# Patient Record
Sex: Female | Born: 1935 | Race: White | Hispanic: No | Smoking: Never smoker
Health system: Southern US, Community
[De-identification: ages and names within clinical notes are randomized; demographics above are authoritative.]

## PROBLEM LIST (undated history)

## (undated) DIAGNOSIS — E785 Hyperlipidemia, unspecified: Secondary | ICD-10-CM

## (undated) DIAGNOSIS — F419 Anxiety disorder, unspecified: Secondary | ICD-10-CM

## (undated) DIAGNOSIS — Z85820 Personal history of malignant melanoma of skin: Secondary | ICD-10-CM

## (undated) DIAGNOSIS — K219 Gastro-esophageal reflux disease without esophagitis: Secondary | ICD-10-CM

## (undated) DIAGNOSIS — I10 Essential (primary) hypertension: Secondary | ICD-10-CM

## (undated) DIAGNOSIS — I2510 Atherosclerotic heart disease of native coronary artery without angina pectoris: Secondary | ICD-10-CM

## (undated) DIAGNOSIS — Z833 Family history of diabetes mellitus: Secondary | ICD-10-CM

## (undated) HISTORY — DX: Family history of diabetes mellitus: Z83.3

## (undated) HISTORY — DX: Gastro-esophageal reflux disease without esophagitis: K21.9

## (undated) HISTORY — DX: Essential (primary) hypertension: I10

## (undated) HISTORY — DX: Hyperlipidemia, unspecified: E78.5

## (undated) HISTORY — DX: Anxiety disorder, unspecified: F41.9

## (undated) HISTORY — DX: Personal history of malignant melanoma of skin: Z85.820

## (undated) HISTORY — PX: APPENDECTOMY: SHX54

---

## 2002-03-17 HISTORY — PX: CORONARY STENT PLACEMENT: SHX1402

## 2004-01-26 ENCOUNTER — Other Ambulatory Visit: Payer: Self-pay

## 2004-01-26 ENCOUNTER — Emergency Department: Payer: Self-pay | Admitting: Emergency Medicine

## 2004-03-17 HISTORY — PX: RENAL ARTERY STENT: SHX2321

## 2004-04-02 ENCOUNTER — Encounter: Payer: Self-pay | Admitting: Internal Medicine

## 2004-10-09 ENCOUNTER — Ambulatory Visit: Payer: Self-pay | Admitting: *Deleted

## 2004-10-21 ENCOUNTER — Ambulatory Visit: Payer: Self-pay | Admitting: Family Medicine

## 2004-12-09 ENCOUNTER — Ambulatory Visit: Payer: Self-pay | Admitting: *Deleted

## 2004-12-12 ENCOUNTER — Ambulatory Visit: Payer: Self-pay | Admitting: *Deleted

## 2005-04-07 ENCOUNTER — Inpatient Hospital Stay: Payer: Self-pay | Admitting: *Deleted

## 2005-11-26 ENCOUNTER — Ambulatory Visit: Payer: Self-pay | Admitting: Family Medicine

## 2006-05-09 ENCOUNTER — Emergency Department: Payer: Self-pay | Admitting: Unknown Physician Specialty

## 2006-05-09 ENCOUNTER — Other Ambulatory Visit: Payer: Self-pay

## 2006-12-01 ENCOUNTER — Ambulatory Visit: Payer: Self-pay | Admitting: Family Medicine

## 2007-12-03 ENCOUNTER — Ambulatory Visit: Payer: Self-pay | Admitting: Family Medicine

## 2007-12-03 LAB — CONVERTED CEMR LAB: Pap Smear: NORMAL

## 2008-01-26 ENCOUNTER — Encounter (INDEPENDENT_AMBULATORY_CARE_PROVIDER_SITE_OTHER): Payer: Self-pay | Admitting: Family Medicine

## 2008-02-04 ENCOUNTER — Ambulatory Visit: Payer: Self-pay | Admitting: Family Medicine

## 2008-02-04 DIAGNOSIS — Z85820 Personal history of malignant melanoma of skin: Secondary | ICD-10-CM

## 2008-02-04 DIAGNOSIS — I2510 Atherosclerotic heart disease of native coronary artery without angina pectoris: Secondary | ICD-10-CM | POA: Insufficient documentation

## 2008-02-04 DIAGNOSIS — R0989 Other specified symptoms and signs involving the circulatory and respiratory systems: Secondary | ICD-10-CM | POA: Insufficient documentation

## 2008-02-04 DIAGNOSIS — F411 Generalized anxiety disorder: Secondary | ICD-10-CM | POA: Insufficient documentation

## 2008-02-04 DIAGNOSIS — I10 Essential (primary) hypertension: Secondary | ICD-10-CM | POA: Insufficient documentation

## 2008-02-04 DIAGNOSIS — E785 Hyperlipidemia, unspecified: Secondary | ICD-10-CM

## 2008-02-04 DIAGNOSIS — E119 Type 2 diabetes mellitus without complications: Secondary | ICD-10-CM | POA: Insufficient documentation

## 2008-02-04 DIAGNOSIS — N183 Chronic kidney disease, stage 3 unspecified: Secondary | ICD-10-CM | POA: Insufficient documentation

## 2008-02-04 DIAGNOSIS — K219 Gastro-esophageal reflux disease without esophagitis: Secondary | ICD-10-CM | POA: Insufficient documentation

## 2008-02-04 DIAGNOSIS — I252 Old myocardial infarction: Secondary | ICD-10-CM | POA: Insufficient documentation

## 2008-02-04 LAB — CONVERTED CEMR LAB
Blood in Urine, dipstick: NEGATIVE
Cholesterol, target level: 200 mg/dL
Glucose, Bld: 88 mg/dL
Glucose, Urine, Semiquant: NEGATIVE
HDL goal, serum: 40 mg/dL
Hgb A1c MFr Bld: 6.7 %
Ketones, urine, test strip: NEGATIVE
LDL Goal: 70 mg/dL
Protein, U semiquant: NEGATIVE
pH: 5.5

## 2008-02-07 ENCOUNTER — Telehealth (INDEPENDENT_AMBULATORY_CARE_PROVIDER_SITE_OTHER): Payer: Self-pay | Admitting: Family Medicine

## 2008-02-08 ENCOUNTER — Telehealth (INDEPENDENT_AMBULATORY_CARE_PROVIDER_SITE_OTHER): Payer: Self-pay | Admitting: *Deleted

## 2008-02-14 ENCOUNTER — Ambulatory Visit (HOSPITAL_COMMUNITY): Admission: RE | Admit: 2008-02-14 | Discharge: 2008-02-14 | Payer: Self-pay | Admitting: Family Medicine

## 2008-02-15 ENCOUNTER — Telehealth (INDEPENDENT_AMBULATORY_CARE_PROVIDER_SITE_OTHER): Payer: Self-pay | Admitting: *Deleted

## 2008-02-18 ENCOUNTER — Ambulatory Visit: Payer: Self-pay | Admitting: Family Medicine

## 2008-02-18 DIAGNOSIS — N183 Chronic kidney disease, stage 3 (moderate): Secondary | ICD-10-CM

## 2008-02-29 ENCOUNTER — Ambulatory Visit: Payer: Self-pay | Admitting: Cardiology

## 2008-03-01 ENCOUNTER — Telehealth (INDEPENDENT_AMBULATORY_CARE_PROVIDER_SITE_OTHER): Payer: Self-pay | Admitting: *Deleted

## 2008-03-02 ENCOUNTER — Encounter (INDEPENDENT_AMBULATORY_CARE_PROVIDER_SITE_OTHER): Payer: Self-pay | Admitting: Family Medicine

## 2008-03-17 HISTORY — PX: NM MYOVIEW LTD: HXRAD82

## 2008-03-20 ENCOUNTER — Encounter (INDEPENDENT_AMBULATORY_CARE_PROVIDER_SITE_OTHER): Payer: Self-pay | Admitting: Family Medicine

## 2008-03-21 LAB — CONVERTED CEMR LAB
ALT: 15 units/L (ref 0–35)
Albumin: 4.3 g/dL (ref 3.5–5.2)
Alkaline Phosphatase: 71 units/L (ref 39–117)
CO2: 26 meq/L (ref 19–32)
Chloride: 105 meq/L (ref 96–112)
Creatinine, Urine: 112.8 mg/dL
Glucose, Bld: 118 mg/dL — ABNORMAL HIGH (ref 70–99)
Microalb Creat Ratio: 5.5 mg/g (ref 0.0–30.0)
Potassium: 4.6 meq/L (ref 3.5–5.3)
Total CHOL/HDL Ratio: 3.6

## 2008-03-27 ENCOUNTER — Ambulatory Visit: Payer: Self-pay | Admitting: Cardiology

## 2008-03-27 ENCOUNTER — Encounter (HOSPITAL_COMMUNITY): Admission: RE | Admit: 2008-03-27 | Discharge: 2008-04-26 | Payer: Self-pay | Admitting: Cardiology

## 2008-03-27 ENCOUNTER — Encounter (INDEPENDENT_AMBULATORY_CARE_PROVIDER_SITE_OTHER): Payer: Self-pay | Admitting: Family Medicine

## 2008-03-29 ENCOUNTER — Telehealth (INDEPENDENT_AMBULATORY_CARE_PROVIDER_SITE_OTHER): Payer: Self-pay | Admitting: *Deleted

## 2008-03-30 ENCOUNTER — Telehealth (INDEPENDENT_AMBULATORY_CARE_PROVIDER_SITE_OTHER): Payer: Self-pay | Admitting: *Deleted

## 2008-04-03 ENCOUNTER — Ambulatory Visit: Payer: Self-pay | Admitting: Cardiology

## 2008-04-27 ENCOUNTER — Telehealth (INDEPENDENT_AMBULATORY_CARE_PROVIDER_SITE_OTHER): Payer: Self-pay | Admitting: *Deleted

## 2008-05-22 ENCOUNTER — Ambulatory Visit: Payer: Self-pay | Admitting: Family Medicine

## 2008-05-22 DIAGNOSIS — J309 Allergic rhinitis, unspecified: Secondary | ICD-10-CM | POA: Insufficient documentation

## 2008-05-22 LAB — CONVERTED CEMR LAB: Hgb A1c MFr Bld: 7 %

## 2008-07-27 ENCOUNTER — Ambulatory Visit: Payer: Self-pay | Admitting: Family Medicine

## 2008-07-27 ENCOUNTER — Telehealth (INDEPENDENT_AMBULATORY_CARE_PROVIDER_SITE_OTHER): Payer: Self-pay | Admitting: *Deleted

## 2008-07-27 DIAGNOSIS — N3000 Acute cystitis without hematuria: Secondary | ICD-10-CM | POA: Insufficient documentation

## 2008-07-27 LAB — CONVERTED CEMR LAB
Bilirubin Urine: NEGATIVE
Glucose, Urine, Semiquant: NEGATIVE
Nitrite: NEGATIVE
Protein, U semiquant: NEGATIVE
Specific Gravity, Urine: 1.015
pH: 5

## 2008-07-31 ENCOUNTER — Telehealth (INDEPENDENT_AMBULATORY_CARE_PROVIDER_SITE_OTHER): Payer: Self-pay | Admitting: Family Medicine

## 2008-08-03 ENCOUNTER — Telehealth (INDEPENDENT_AMBULATORY_CARE_PROVIDER_SITE_OTHER): Payer: Self-pay | Admitting: *Deleted

## 2008-08-24 ENCOUNTER — Ambulatory Visit: Payer: Self-pay | Admitting: Family Medicine

## 2008-08-25 LAB — CONVERTED CEMR LAB
Albumin: 4.1 g/dL (ref 3.5–5.2)
Alkaline Phosphatase: 65 units/L (ref 39–117)
CO2: 22 meq/L (ref 19–32)
Chloride: 104 meq/L (ref 96–112)
LDL Cholesterol: 121 mg/dL — ABNORMAL HIGH (ref 0–99)
Potassium: 4.6 meq/L (ref 3.5–5.3)
Sodium: 138 meq/L (ref 135–145)
Total Bilirubin: 0.5 mg/dL (ref 0.3–1.2)
Total CHOL/HDL Ratio: 4.4
Total Protein: 7 g/dL (ref 6.0–8.3)

## 2008-09-16 ENCOUNTER — Emergency Department (HOSPITAL_COMMUNITY): Admission: EM | Admit: 2008-09-16 | Discharge: 2008-09-16 | Payer: Self-pay | Admitting: Emergency Medicine

## 2008-09-20 ENCOUNTER — Encounter (INDEPENDENT_AMBULATORY_CARE_PROVIDER_SITE_OTHER): Payer: Self-pay | Admitting: Family Medicine

## 2008-12-28 ENCOUNTER — Ambulatory Visit: Payer: Self-pay

## 2009-01-08 ENCOUNTER — Ambulatory Visit: Payer: Self-pay

## 2009-03-17 ENCOUNTER — Emergency Department (HOSPITAL_COMMUNITY): Admission: EM | Admit: 2009-03-17 | Discharge: 2009-03-17 | Payer: Self-pay | Admitting: Emergency Medicine

## 2009-07-25 ENCOUNTER — Encounter (INDEPENDENT_AMBULATORY_CARE_PROVIDER_SITE_OTHER): Payer: Self-pay | Admitting: *Deleted

## 2009-09-23 IMAGING — NM NM MYOCAR MULTI W/SPECT W/WALL MOTION & EF
2 series · 12 of 12 positions shown · non-contrast
Comparison: none

Ordering Physician: Noramay Relles

Felicia Mari Physician: [REDACTED]al Data: 88-year-old woman with chest and epigastric
discomfort during exertion.
NUCLEAR MEDICINE STRESS MYOVIEW STUDY WITH SPECT AND LEFT
VENTRICULAR EJECTION FRACTION
Radionuclide Data: One-day rest/stress protocol performed with
[DATE] mCi of Sc-55m Myoview.
Stress Data: Graded exercise performed to a workload of 6.8 mets
and a heart rate of 166, 123% of age - predicted maximum.  Exercise
discontinued due to mild fatigue; no chest pain reported.  Blood
pressure increased from a resting value of 150/80 to 180/80 early
in recovery, a normal response.  No arrhythmias noted.
EKG: Normal sinus rhythm; somewhat delayed R-wave progression;
minor nonspecific T-wave abnormality; mild QT prolongation.
Stress EKG:  One - 2 mm of slowly upsloping ST-segment depression
noted in the inferior leads as well as lead I.  Reverted towards
normal rapidly in recovery.
Scintigraphic Data: Acquisition notable for mild breast attenuation
and minimal diaphragmatic attenuation.  Left ventricular size was
normal.  On tomographic images reconstructed in standard planes,
there was uniform and normal uptake of tracer in all myocardial
segments.  The rest images were unchanged.  The gated
reconstruction demonstrated normal to hyperdynamic regional and
global LV systolic function as well as normal systolic accentuation
of activity throughout.  Estimated ejection fraction exceeded 0.65.

[Series 1: cr cardiac tc low dose · 6.52mm/px · 6 of 64 frames shown]
[frame 6/64]
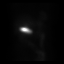
[frame 16/64]
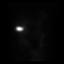
[frame 27/64]
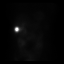
[frame 38/64]
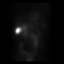
[frame 48/64]
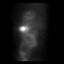
[frame 59/64]
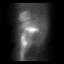

[Series 1: cs cardiac tc hi dose · 6.52mm/px · 6 of 512 frames shown]
[frame 43/512]
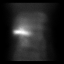
[frame 128/512]
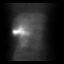
[frame 214/512]
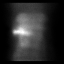
[frame 299/512]
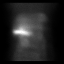
[frame 384/512]
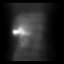
[frame 470/512]
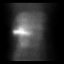

[12 of 12 positions shown; findings below may reference images not displayed]

IMPRESSION: Negative stress nuclear myocardial study revealing adequate
exercise tolerance for age, a borderline positive stress EKG,
normal left ventricular size and normal left ventricular systolic
function.  An entirely normal perfusion study suggests that the
electrocardiographic response is a false positive.  There is no
scintigraphic evidence for myocardial ischemia or infarction.
Other findings as noted.

## 2010-01-15 ENCOUNTER — Ambulatory Visit: Payer: Self-pay | Admitting: Internal Medicine

## 2010-04-16 NOTE — Letter (Signed)
Summary: Appointment - Reminder 2  [facility] [place]. [address], [place] [address]   Phone: [phone]  Fax: [phone]     Jul 25, 2009 MRN: [id number]   Regina Good [address] [place], [place]  [address]   Dear Regina Good,  Our records indicate that it is time to schedule a follow-up appointment.  Dr. Dietrich Pates         recommended that you follow up with [place] in     JANUARY 2011      . It is very important that we reach you to schedule this appointment. We look forward to participating in your health care needs. Please contact us at the number listed above at your earliest convenience to schedule your appointment.  If you are unable to make an appointment at this time, give [place] a call so we can update our records.     Sincerely,   [employer] Scheduling Team [phone]

## 2010-06-02 LAB — URINALYSIS, ROUTINE W REFLEX MICROSCOPIC
Glucose, UA: NEGATIVE mg/dL
Ketones, ur: NEGATIVE mg/dL
Protein, ur: NEGATIVE mg/dL
Specific Gravity, Urine: 1.015 (ref 1.005–1.030)
pH: 6 (ref 5.0–8.0)

## 2010-06-02 LAB — URINE MICROSCOPIC-ADD ON

## 2010-06-23 LAB — URINALYSIS, ROUTINE W REFLEX MICROSCOPIC: Glucose, UA: NEGATIVE mg/dL

## 2010-06-23 LAB — URINE CULTURE: Colony Count: 15000

## 2010-06-23 LAB — URINE MICROSCOPIC-ADD ON

## 2010-07-30 NOTE — Letter (Signed)
February 29, 2008    Franchot Heidelberg, M.D.  [address]  [place], [place], [address]   RE:  CHERINE, DRUMGOOLE  MRN:  [id number]  /  DOB:  November 24, 1919   Dear Remi Haggard,   It is my pleasure evaluating Ms. Tumolo in the office today in  consultation at your request for coronary artery disease.  As you know,  this nice woman suffered an acute myocardial infarction in late 2005.  She was referred to [facility] where percutaneous  intervention with a stent was undertaken.  She subsequently has done  extremely well from a cardiac standpoint, requiring no further  attention.  She was seen briefly by one of the cardiologists in  [place], but not for years.  She does have multiple risk factors  including hypertension, hyperlipidemia, and diabetes, all of which have  been in good control medically.  She was evaluated by a vascular  physician in 2006 and found to have bilateral renal artery stenosis for  which stents were placed.   She describes allergies to SULFA drugs and CODEINE.   CURRENT MEDICATIONS:  1. Glipizide 10 mg b.i.d.  2. Atorvastatin 40 mg daily.  3. Diazepam 2 mg nightly.  4. Aspirin 81 mg daily.  5. Lisinopril/HCT 1 daily.   SOCIAL HISTORY:  Retired; married and lives in [place].  She  is changing physicians to allow her to drive less.  She has 3 adult  children.   FAMILY HISTORY:  Father died due to myocardial infarction.  Mother had  gastric carcinoma at age 26.  Of 11 siblings, 2 have had CVAs.   REVIEW OF SYSTEMS:  Notable for the need for corrective lenses,  bilateral hearing loss with hearing aids, upper and lower dentures, a  remote history of jaundice, urinary frequency, and arthritic discomfort  in her fingers.  All other systems are negative.   PHYSICAL EXAMINATION:  GENERAL:  Pleasant, well-appearing, trim woman in  no acute distress.  VITAL SIGNS:  The height is 5 feet 5 inches, weight 140, blood pressure  130/80,  heart rate 79 and regular, and respirations 14.  HEENT:  Small pupils that are equal, round, and react to light; EOMs  full; normal oral mucosa.  NECK:  No jugular venous distention; normal carotid upstrokes without  bruits.  ENDOCRINE:  No thyromegaly.  HEMATOPOIETIC:  No adenopathy.  SKIN:  No significant lesions.  LUNGS:  Clear.  CARDIAC:  Normal first and second heart sounds; modest systolic murmur  at the left sternal border.  ABDOMEN:  Soft and nontender; no organomegaly.  EXTREMITIES:  No edema; distal pulses intact.  NEUROLOGIC:  Symmetric strength and tone; normal cranial nerves.   EKG:  Normal sinus rhythm; slightly delayed R-wave progression;  borderline left atrial abnormality; otherwise unremarkable.   No recent laboratory studies are available.   IMPRESSION:  Ms. Spielberg has coronary artery disease and multiple  cardiovascular risk factors.  She describes episodes of lower  chest/epigastric tightness, when working in her garden or performing  other moderate activity.  Symptoms resolved with 10-15 minutes of rest.  She has nitroglycerin, but has not used them.  A stress test to evaluate  for myocardial ischemia is clearly warranted.  She would like to defer  this until January, which is a reasonable plan.   We will secure recent records from her previous primary care [facility].  We will verify adequate control of hyperlipidemia.  Blood pressure  control looks fine  at present, but in the setting of coronary artery  disease with prior myocardial infarction, treatment with a beta-blocker  is warranted.  We will use Toprol at a dose of 25 mg daily.  I will  reassess this nice woman after her stress test has been completed.    Sincerely,      Gerrit Friends. Dietrich Pates, MD, [facility]  Electronically Signed    RMR/MedQ  DD: 02/29/2008  DT: 03/01/2008  Job #: [id number]

## 2010-07-30 NOTE — Letter (Signed)
April 03, 2008    Franchot Heidelberg, MD  [address]  [place], [place]  [address]   RE:  Regina, Good  MRN:  [id number]  /  DOB:  [birth year]   Dear Remi Haggard,   Regina Good returns to the office for continued assessment and treatment  of coronary artery disease.  Since her last visit, she has had no  dyspnea nor chest discomfort.  She remains active and generally feels  fine.  She did undergo a stress nuclear study that showed fairly good  exercise tolerance with a borderline abnormal EKG response, but a  completely normal perfusion study.   Medications are unchanged from her last visit except for the addition of  Toprol 25 mg daily.   PHYSICAL EXAMINATION:  GENERAL:  Pleasant, trim woman in no acute  distress.  VITAL SIGNS:  The weight is 143, 3 pounds more than at her last visit,  blood pressure 140/70, heart rate 65 and regular, respirations 14.  NECK:  No jugular venous distention.  No carotid bruits.  LUNGS:  Clear.  CARDIAC:  Normal first and second heart sounds.  Minimal systolic  murmur.  ABDOMEN:  Soft and nontender.  No bruits.  EXTREMITIES:  No edema.  Distal pulses are somewhat reduced,  particularly on the left, but intact.  The patient has no history of  claudication.   Recent laboratory reveals good control of lipids with total cholesterol  of 175, triglycerides of 170, HDL of 49, and LDL of 92.  A chemistry  profile is essentially normal except for a fasting glucose of 118, BUN  of 25, and creatinine of 1.36.  Microalbumin in the urine is normal.   IMPRESSION:  Regina Good is doing very well with near optimal control of  cardiovascular risk factors and an excellent perfusion study.  It would  be worthwhile to collect additional blood pressure measurements and try  to decrease systolics to 130, perhaps by increasing her dose of  lisinopril and Toprol.  I will leave that to your discretion and plan to  see this nice woman again in 1 year.    Sincerely,      Gerrit Friends. Dietrich Pates, MD, [facility]  Electronically Signed    RMR/MedQ  DD: 04/03/2008  DT: 04/04/2008  Job #: [id number]

## 2011-03-13 ENCOUNTER — Encounter: Payer: Self-pay | Admitting: Cardiology

## 2013-11-03 ENCOUNTER — Other Ambulatory Visit (HOSPITAL_COMMUNITY): Payer: Self-pay | Admitting: Internal Medicine

## 2013-11-03 ENCOUNTER — Ambulatory Visit (HOSPITAL_COMMUNITY)
Admission: RE | Admit: 2013-11-03 | Discharge: 2013-11-03 | Disposition: A | Discharge status: Home / Self Care | Payer: Medicare PPO | Source: Ambulatory Visit | Attending: Internal Medicine | Admitting: Internal Medicine

## 2013-11-03 DIAGNOSIS — M545 Low back pain, unspecified: Secondary | ICD-10-CM | POA: Diagnosis present

## 2013-11-03 DIAGNOSIS — T148XXA Other injury of unspecified body region, initial encounter: Secondary | ICD-10-CM

## 2013-11-03 DIAGNOSIS — I700 Atherosclerosis of aorta: Secondary | ICD-10-CM | POA: Diagnosis not present

## 2013-11-03 DIAGNOSIS — M47817 Spondylosis without myelopathy or radiculopathy, lumbosacral region: Secondary | ICD-10-CM | POA: Insufficient documentation

## 2014-01-19 ENCOUNTER — Emergency Department (HOSPITAL_COMMUNITY)
Admission: EM | Admit: 2014-01-19 | Discharge: 2014-01-19 | Disposition: A | Discharge status: Home / Self Care | Payer: Commercial Managed Care - HMO | Attending: Emergency Medicine | Admitting: Emergency Medicine

## 2014-01-19 ENCOUNTER — Emergency Department (HOSPITAL_COMMUNITY): Payer: Commercial Managed Care - HMO

## 2014-01-19 ENCOUNTER — Encounter (HOSPITAL_COMMUNITY): Payer: Self-pay | Admitting: Emergency Medicine

## 2014-01-19 DIAGNOSIS — F4321 Adjustment disorder with depressed mood: Secondary | ICD-10-CM | POA: Diagnosis not present

## 2014-01-19 DIAGNOSIS — K219 Gastro-esophageal reflux disease without esophagitis: Secondary | ICD-10-CM | POA: Diagnosis not present

## 2014-01-19 DIAGNOSIS — Z79899 Other long term (current) drug therapy: Secondary | ICD-10-CM | POA: Diagnosis not present

## 2014-01-19 DIAGNOSIS — Z85820 Personal history of malignant melanoma of skin: Secondary | ICD-10-CM | POA: Diagnosis not present

## 2014-01-19 DIAGNOSIS — K5900 Constipation, unspecified: Secondary | ICD-10-CM | POA: Diagnosis not present

## 2014-01-19 DIAGNOSIS — Z791 Long term (current) use of non-steroidal anti-inflammatories (NSAID): Secondary | ICD-10-CM | POA: Diagnosis not present

## 2014-01-19 DIAGNOSIS — F419 Anxiety disorder, unspecified: Secondary | ICD-10-CM | POA: Insufficient documentation

## 2014-01-19 DIAGNOSIS — Z7982 Long term (current) use of aspirin: Secondary | ICD-10-CM | POA: Diagnosis not present

## 2014-01-19 DIAGNOSIS — I10 Essential (primary) hypertension: Secondary | ICD-10-CM | POA: Diagnosis not present

## 2014-01-19 DIAGNOSIS — Z9861 Coronary angioplasty status: Secondary | ICD-10-CM | POA: Insufficient documentation

## 2014-01-19 DIAGNOSIS — I119 Hypertensive heart disease without heart failure: Secondary | ICD-10-CM | POA: Diagnosis not present

## 2014-01-19 LAB — CBC WITH DIFFERENTIAL/PLATELET
BASOS ABS: 0 10*3/uL (ref 0.0–0.1)
BASOS PCT: 0 % (ref 0–1)
EOS PCT: 2 % (ref 0–5)
Eosinophils Absolute: 0.2 10*3/uL (ref 0.0–0.7)
HEMATOCRIT: 35.6 % — AB (ref 36.0–46.0)
HEMOGLOBIN: 11.2 g/dL — AB (ref 12.0–15.0)
LYMPHS PCT: 17 % (ref 12–46)
Lymphs Abs: 1.4 10*3/uL (ref 0.7–4.0)
MCH: 26.9 pg (ref 26.0–34.0)
MCHC: 31.5 g/dL (ref 30.0–36.0)
MCV: 85.4 fL (ref 78.0–100.0)
MONO ABS: 0.4 10*3/uL (ref 0.1–1.0)
MONOS PCT: 5 % (ref 3–12)
Neutro Abs: 6.4 10*3/uL (ref 1.7–7.7)
Neutrophils Relative %: 76 % (ref 43–77)
PLATELETS: 332 10*3/uL (ref 150–400)
RBC: 4.17 MIL/uL (ref 3.87–5.11)
RDW: 14.2 % (ref 11.5–15.5)
WBC: 8.5 10*3/uL (ref 4.0–10.5)

## 2014-01-19 LAB — LIPASE, BLOOD: LIPASE: 63 U/L — AB (ref 11–59)

## 2014-01-19 LAB — COMPREHENSIVE METABOLIC PANEL
ALBUMIN: 3.7 g/dL (ref 3.5–5.2)
ALK PHOS: 100 U/L (ref 39–117)
ALT: 15 U/L (ref 0–35)
AST: 19 U/L (ref 0–37)
Anion gap: 12 (ref 5–15)
BILIRUBIN TOTAL: 0.4 mg/dL (ref 0.3–1.2)
BUN: 22 mg/dL (ref 6–23)
CHLORIDE: 103 meq/L (ref 96–112)
CO2: 25 meq/L (ref 19–32)
CREATININE: 1.47 mg/dL — AB (ref 0.50–1.10)
Calcium: 9 mg/dL (ref 8.4–10.5)
GFR calc non Af Amer: 30 mL/min — ABNORMAL LOW (ref >90)
GFR, EST AFRICAN AMERICAN: 35 mL/min — AB (ref >90)
Glucose, Bld: 154 mg/dL — ABNORMAL HIGH (ref 70–99)
POTASSIUM: 4.3 meq/L (ref 3.7–5.3)
Sodium: 140 mEq/L (ref 137–147)
Total Protein: 7.3 g/dL (ref 6.0–8.3)

## 2014-01-19 LAB — URINALYSIS, ROUTINE W REFLEX MICROSCOPIC
Bilirubin Urine: NEGATIVE
GLUCOSE, UA: NEGATIVE mg/dL
HGB URINE DIPSTICK: NEGATIVE
Ketones, ur: NEGATIVE mg/dL
Leukocytes, UA: NEGATIVE
Nitrite: NEGATIVE
PH: 5.5 (ref 5.0–8.0)
Protein, ur: NEGATIVE mg/dL
SPECIFIC GRAVITY, URINE: 1.02 (ref 1.005–1.030)
Urobilinogen, UA: 0.2 mg/dL (ref 0.0–1.0)

## 2014-01-19 MED ORDER — ALPRAZOLAM 0.5 MG PO TABS
0.5000 mg | ORAL_TABLET | Freq: Once | ORAL | Status: AC
  Administered 2014-01-19: 0.5 mg via ORAL
  Filled 2014-01-19: qty 1

## 2014-01-19 NOTE — Discharge Instructions (Signed)
Please use colace, senokot, and drink plenty of fluids for constipation.  Take ativan as prescribed by Dr. Nevada Crane for anxiety and sleep.  Recheck with Dr. Nevada Crane next week or return here if worse.   Constipation Constipation is when a person has fewer than three bowel movements a week, has difficulty having a bowel movement, or has stools that are dry, hard, or larger than normal. As people grow older, constipation is more common. If you try to fix constipation with medicines that make you have a bowel movement (laxatives), the problem may get worse. Long-term laxative use may cause the muscles of the colon to become weak. A low-fiber diet, not taking in enough fluids, and taking certain medicines may make constipation worse.  CAUSES   Certain medicines, such as antidepressants, pain medicine, iron supplements, antacids, and water pills.   Certain diseases, such as diabetes, irritable bowel syndrome (IBS), thyroid disease, or depression.   Not drinking enough water.   Not eating enough fiber-rich foods.   Stress or travel.   Lack of physical activity or exercise.   Ignoring the urge to have a bowel movement.   Using laxatives too much.  SIGNS AND SYMPTOMS   Having fewer than three bowel movements a week.   Straining to have a bowel movement.   Having stools that are hard, dry, or larger than normal.   Feeling full or bloated.   Pain in the lower abdomen.   Not feeling relief after having a bowel movement.  DIAGNOSIS  Your health care provider will take a medical history and perform a physical exam. Further testing may be done for severe constipation. Some tests may include:  A barium enema X-Chasitie Passey to examine your rectum, colon, and, sometimes, your small intestine.   A sigmoidoscopy to examine your lower colon.   A colonoscopy to examine your entire colon. TREATMENT  Treatment will depend on the severity of your constipation and what is causing it. Some dietary  treatments include drinking more fluids and eating more fiber-rich foods. Lifestyle treatments may include regular exercise. If these diet and lifestyle recommendations do not help, your health care provider may recommend taking over-the-counter laxative medicines to help you have bowel movements. Prescription medicines may be prescribed if over-the-counter medicines do not work.  HOME CARE INSTRUCTIONS   Eat foods that have a lot of fiber, such as fruits, vegetables, whole grains, and beans.  Limit foods high in fat and processed sugars, such as french fries, hamburgers, cookies, candies, and soda.   A fiber supplement may be added to your diet if you cannot get enough fiber from foods.   Drink enough fluids to keep your urine clear or pale yellow.   Exercise regularly or as directed by your health care provider.   Go to the restroom when you have the urge to go. Do not hold it.   Only take over-the-counter or prescription medicines as directed by your health care provider. Do not take other medicines for constipation without talking to your health care provider first.  Regina Good IF:   You have bright red blood in your stool.   Your constipation lasts for more than 4 days or gets worse.   You have abdominal or rectal pain.   You have thin, pencil-like stools.   You have unexplained weight loss. MAKE SURE YOU:   Understand these instructions.  Will watch your condition.  Will get help right away if you are not doing well or get worse.  Document Released: 11/30/2003 Document Revised: 03/08/2013 Document Reviewed: 12/13/2012 [facility] Patient Information 2015 [place], [place]. This information is not intended to replace advice given to you by your health care provider. Make sure you discuss any questions you have with your health care provider.

## 2014-01-19 NOTE — ED Notes (Addendum)
Patient c/o anxiety since October 20th, when her husband passed away.Patient states "I couldn't sleep last night I was shaking the bed so hard." Patient also c/o constipation and nausea with lower back pain, last normal BM 4 days ago. Patient reports taking stool softeners and laxatives with no relief.

## 2014-01-19 NOTE — ED Notes (Signed)
MD at bedside. 

## 2014-01-19 NOTE — ED Notes (Signed)
PT c/o increased anxiety and decrease of sleep, but pt states she has been out of her xanax medication for 2 weeks. PT to follow up with her orthopedic MD at the office tomorrow for her lower back pain per pt.

## 2014-01-19 NOTE — ED Provider Notes (Signed)
CSN: [id number]     Arrival date & time 01/19/14  4481 History   First MD Initiated Contact with Patient 01/19/14 0840     Chief Complaint  Patient presents with  . Anxiety  . Constipation     (Consider location/radiation/quality/duration/timing/severity/associated sxs/prior Treatment) HPI 80 year old female who comes in today complaining of anxiety and constipation. She states that she has had constipation for a month. She states this began when she began having back pain. She went to see her primary care doctor and was started on medication. She states the constipation began at the same time as when she started taking the medication. She says that she has since discontinued this medicine was unable to tell me what it was. She has continued to have constipation. She feels that she is more anxious than usual. However, she did lose her husband of 28 years 2 weeks ago. She had a prescription for Xanax 0.25 mg every at bedtime which she has an empty bottle of. She states she has not been taking that for a couple of days as she has been out of it. She denies injury to her back except pain when she stood up from squatting down a month ago. She has not had lateralized pain, numbness, tingling, lateralized weakness, or prior loss of bowel or bladder control. She has been eating and drinking somewhat less than usual but has been taking things in by mouth. She states that she is not living alone and her daughter has been with her. Past Medical History  Diagnosis Date  . Diabetes mellitus   . Hypertension   . Hyperlipidemia   . History of melanoma   . Family history of diabetes mellitus   . GERD (gastroesophageal reflux disease)   . Anxiety    Past Surgical History  Procedure Laterality Date  . Appendectomy    . Renal artery stent  2006  . Coronary stent placement  2004  . Nm myoview ltd  2010   Family History  Problem Relation Age of Onset  . Pancreatic cancer Mother   . Coronary artery  disease Father   . Diabetes Brother   . Colon cancer Sister   . Stroke Sister   . Stroke Sister   . Diabetes Son   . Stroke Son   . Schizophrenia Daughter    History  Substance Use Topics  . Smoking status: Never Smoker   . Smokeless tobacco: Never Used  . Alcohol Use: No   OB History    Gravida Para Term Preterm AB TAB SAB Ectopic Multiple Living   3 3 3       3      Review of Systems  All other systems reviewed and are negative.     Allergies  Codeine and Sulfonamide derivatives  Home Medications   Prior to Admission medications   Medication Sig Start Date End Date Taking? Authorizing Provider  acetaminophen (TYLENOL) 500 MG tablet Take 500 mg by mouth every 6 (six) hours as needed for moderate pain.   Yes Historical Provider, MD  ALPRAZolam (XANAX) 0.25 MG tablet Take 0.125 mg by mouth at bedtime as needed for anxiety.    Yes Historical Provider, MD  amLODipine (NORVASC) 5 MG tablet Take 5 mg by mouth daily.   Yes Historical Provider, MD  aspirin 81 MG tablet Take 81 mg by mouth daily.     Yes Historical Provider, MD  atorvastatin (LIPITOR) 80 MG tablet Take 80 mg by mouth daily.  Yes Historical Provider, MD  diclofenac (VOLTAREN) 75 MG EC tablet Take 1 tablet by mouth daily.  11/03/13  Yes Historical Provider, MD  glipiZIDE (GLUCOTROL) 10 MG tablet Take 5-10 mg by mouth 2 (two) times daily.    Yes Historical Provider, MD  LORazepam (ATIVAN) 0.5 MG tablet Take 1 tablet by mouth 3 times/day as needed-between meals & bedtime for anxiety or sleep.  01/13/14  Yes Historical Provider, MD  losartan (COZAAR) 25 MG tablet Take 25 mg by mouth daily.   Yes Historical Provider, MD  metoprolol (LOPRESSOR) 50 MG tablet Take 1 tablet by mouth 2 (two) times daily. 12/24/13  Yes Historical Provider, MD  omeprazole (PRILOSEC) 20 MG capsule Take 20 mg by mouth daily.    Yes Historical Provider, MD  traMADol (ULTRAM) 50 MG tablet Take 1 tablet by mouth 4 (four) times daily as needed for  moderate pain.  12/24/13  Yes Historical Provider, MD   BP 135/61 mmHg  Pulse 62  Temp(Src) 98 F (36.7 C) (Oral)  Resp 22  Ht 5\' 5"  (1.651 m)  Wt 140 lb (63.504 kg)  BMI 23.30 kg/m2  SpO2 96% Physical Exam  Constitutional: She appears well-developed and well-nourished.  HENT:  Head: Normocephalic and atraumatic.  Right Ear: External ear normal.  Left Ear: External ear normal.  Nose: Nose normal.  Mouth/Throat: Oropharynx is clear and moist.  Eyes: Conjunctivae and EOM are normal. Pupils are equal, round, and reactive to light.  Neck: Normal range of motion.  Cardiovascular: Normal rate, regular rhythm, normal heart sounds and intact distal pulses.   Pulmonary/Chest: Effort normal and breath sounds normal.  Abdominal: Soft. Bowel sounds are normal. She exhibits no mass. There is no tenderness. There is no rebound and no guarding.  Genitourinary: Rectum normal.  Musculoskeletal: Normal range of motion. She exhibits no edema or tenderness.  Cervical, thoracic, and lumbar spine are nontender to palpation  Neurological: She is alert. She has normal reflexes. No cranial nerve deficit. Coordination normal.  Skin: Skin is warm and dry.  Psychiatric: She has a normal mood and affect. Her behavior is normal. Judgment and thought content normal.  Nursing note and vitals reviewed.   ED Course  Procedures (including critical care time) Labs Review Labs Reviewed  CBC WITH DIFFERENTIAL - Abnormal; Notable for the following:    Hemoglobin 11.2 (*)    HCT 35.6 (*)    All other components within normal limits  COMPREHENSIVE METABOLIC PANEL - Abnormal; Notable for the following:    Glucose, Bld 154 (*)    Creatinine, Ser 1.47 (*)    GFR calc non Af Amer 30 (*)    GFR calc Af Amer 35 (*)    All other components within normal limits  LIPASE, BLOOD - Abnormal; Notable for the following:    Lipase 63 (*)    All other components within normal limits  URINALYSIS, ROUTINE W REFLEX  MICROSCOPIC    Imaging Review Dg Abd 2 Views  01/19/2014   CLINICAL DATA:  Anxiety and constipation  EXAM: ABDOMEN - 2 VIEW  COMPARISON:  11/03/2013  FINDINGS: Nonobstructive bowel gas pattern. Negative for ileus. No air-fluid level in the bowel. No free air. Mild amount of retained stool in the left colon. No renal calculi. Degenerative changes in the lumbar spine and hip joints bilaterally. Atherosclerotic disease.  IMPRESSION: Nonobstructive bowel gas pattern.  Retained stool left colon.   Electronically Signed   By: Franchot Gallo M.D.   On: 01/19/2014 10:25  EKG Interpretation None      MDM   Final diagnoses:  Constipation, unspecified constipation type  Grief reaction   80 year old female who presents today complaining of constipation and anxiety. She has recently lost her husband. Workup here is essentially normal with some nonobstructive bowel gas noted on her plain films. She is slightly anemic with a hemoglobin of 11.2. She is diabetic and her blood sugar here is 154. Her creatinine is 1.47 which is stable from prior creatinines. On review of the case with her primary care physician, Dr. Nevada Crane, the patient has been off Xanax but has been taking Ativan. She was given an extra dose of Xanax here. She is also taking Ultram although it is unclear how often she is taking this. She has been advised to stop Ultram if she tolerates this with her back pain. She is given instructions regarding Colace, fluids, and Senokot. She is instructed to follow-up with Dr. Nevada Crane regarding any ongoing issues with constipation. She's not appear to have any fecal impaction or obstruction of her bowel currently. I discussed her care with the patient, her son, and Dr. Nevada Crane. Patient and her family voice understanding of the plan and need for follow-up.    Shaune Pollack, MD 01/19/14 [id number]

## 2014-09-08 ENCOUNTER — Encounter (HOSPITAL_COMMUNITY): Payer: Self-pay | Admitting: Emergency Medicine

## 2014-09-08 ENCOUNTER — Emergency Department (HOSPITAL_COMMUNITY): Payer: Medicare Other

## 2014-09-08 ENCOUNTER — Emergency Department (HOSPITAL_COMMUNITY)
Admission: EM | Admit: 2014-09-08 | Discharge: 2014-09-08 | Disposition: A | Discharge status: Home / Self Care | Payer: Medicare Other | Attending: Emergency Medicine | Admitting: Emergency Medicine

## 2014-09-08 DIAGNOSIS — F419 Anxiety disorder, unspecified: Secondary | ICD-10-CM | POA: Insufficient documentation

## 2014-09-08 DIAGNOSIS — K219 Gastro-esophageal reflux disease without esophagitis: Secondary | ICD-10-CM | POA: Insufficient documentation

## 2014-09-08 DIAGNOSIS — Z85820 Personal history of malignant melanoma of skin: Secondary | ICD-10-CM | POA: Diagnosis not present

## 2014-09-08 DIAGNOSIS — R1084 Generalized abdominal pain: Secondary | ICD-10-CM | POA: Diagnosis present

## 2014-09-08 DIAGNOSIS — Z7982 Long term (current) use of aspirin: Secondary | ICD-10-CM | POA: Insufficient documentation

## 2014-09-08 DIAGNOSIS — Z9861 Coronary angioplasty status: Secondary | ICD-10-CM | POA: Insufficient documentation

## 2014-09-08 DIAGNOSIS — M549 Dorsalgia, unspecified: Secondary | ICD-10-CM | POA: Diagnosis not present

## 2014-09-08 DIAGNOSIS — I10 Essential (primary) hypertension: Secondary | ICD-10-CM | POA: Insufficient documentation

## 2014-09-08 DIAGNOSIS — K5900 Constipation, unspecified: Secondary | ICD-10-CM | POA: Insufficient documentation

## 2014-09-08 DIAGNOSIS — R110 Nausea: Secondary | ICD-10-CM | POA: Insufficient documentation

## 2014-09-08 DIAGNOSIS — E119 Type 2 diabetes mellitus without complications: Secondary | ICD-10-CM | POA: Diagnosis not present

## 2014-09-08 DIAGNOSIS — E785 Hyperlipidemia, unspecified: Secondary | ICD-10-CM | POA: Insufficient documentation

## 2014-09-08 DIAGNOSIS — Z79899 Other long term (current) drug therapy: Secondary | ICD-10-CM | POA: Diagnosis not present

## 2014-09-08 LAB — CBC WITH DIFFERENTIAL/PLATELET
Basophils Absolute: 0 10*3/uL (ref 0.0–0.1)
Basophils Relative: 0 % (ref 0–1)
EOS ABS: 0.1 10*3/uL (ref 0.0–0.7)
Eosinophils Relative: 2 % (ref 0–5)
HCT: 39.8 % (ref 36.0–46.0)
HEMOGLOBIN: 12.6 g/dL (ref 12.0–15.0)
LYMPHS ABS: 1.7 10*3/uL (ref 0.7–4.0)
LYMPHS PCT: 24 % (ref 12–46)
MCH: 27.3 pg (ref 26.0–34.0)
MCHC: 31.7 g/dL (ref 30.0–36.0)
MCV: 86.3 fL (ref 78.0–100.0)
MONO ABS: 0.5 10*3/uL (ref 0.1–1.0)
Monocytes Relative: 6 % (ref 3–12)
Neutro Abs: 5 10*3/uL (ref 1.7–7.7)
Neutrophils Relative %: 68 % (ref 43–77)
Platelets: 318 10*3/uL (ref 150–400)
RBC: 4.61 MIL/uL (ref 3.87–5.11)
RDW: 14.4 % (ref 11.5–15.5)
WBC: 7.3 10*3/uL (ref 4.0–10.5)

## 2014-09-08 LAB — COMPREHENSIVE METABOLIC PANEL
ALBUMIN: 4.1 g/dL (ref 3.5–5.0)
ALK PHOS: 64 U/L (ref 38–126)
ALT: 18 U/L (ref 14–54)
ANION GAP: 10 (ref 5–15)
AST: 25 U/L (ref 15–41)
BILIRUBIN TOTAL: 0.7 mg/dL (ref 0.3–1.2)
BUN: 26 mg/dL — AB (ref 6–20)
CHLORIDE: 103 mmol/L (ref 101–111)
CO2: 26 mmol/L (ref 22–32)
Calcium: 9 mg/dL (ref 8.9–10.3)
Creatinine, Ser: 1.27 mg/dL — ABNORMAL HIGH (ref 0.44–1.00)
GFR calc Af Amer: 41 mL/min — ABNORMAL LOW (ref >60)
GFR calc non Af Amer: 36 mL/min — ABNORMAL LOW (ref >60)
Glucose, Bld: 102 mg/dL — ABNORMAL HIGH (ref 65–99)
POTASSIUM: 4.3 mmol/L (ref 3.5–5.1)
Sodium: 139 mmol/L (ref 135–145)
Total Protein: 7.3 g/dL (ref 6.5–8.1)

## 2014-09-08 LAB — LIPASE, BLOOD: Lipase: 37 U/L (ref 22–51)

## 2014-09-08 LAB — POC OCCULT BLOOD, ED: Fecal Occult Bld: NEGATIVE

## 2014-09-08 MED ORDER — LACTULOSE 10 GM/15ML PO SOLN: 10.0000 g | Freq: Two times a day (BID) | ORAL | Status: DC

## 2014-09-08 MED ORDER — SODIUM CHLORIDE 0.9 % IV SOLN
1000.0000 mL | INTRAVENOUS | Status: DC
  Administered 2014-09-08: 1000 mL via INTRAVENOUS

## 2014-09-08 NOTE — ED Provider Notes (Signed)
CSN: [id number]     Arrival date & time 09/08/14  1203 History   First MD Initiated Contact with Patient 09/08/14 1347     Chief Complaint  Patient presents with  . Abdominal Pain  . Constipation   HPI Comments: Patient presents to the emergency room with complaints of generalized abdominal discomfort for the last month or 2. Patient states she really just does not feel well. She has not been able to have a normal bowel movement without using laxities. She has felt constipated for over the last month. She has intermittent episodes of nausea but has not had any vomiting. The abdominal discomfort is vague Specify other than it doesn't feel well. She does have some back pain but that has been attributed to musculoskeletal causes. She has actually seen a back doctor. Patient does not tell me that her stomach pain is actually radiating to her back. She denies any blood in her stools. She is concerned she might have a blockage. Patient has never had a screening colonoscopy.  Patient is a 81 y.o. female presenting with abdominal pain and constipation. The history is provided by the patient.  Abdominal Pain Pain location:  Generalized Pain severity:  Moderate Onset quality:  Gradual Duration:  8 weeks Timing:  Constant Chronicity:  New Context: not eating, not sick contacts, not suspicious food intake and not trauma   Relieved by:  Nothing Worsened by:  Nothing tried Ineffective treatments: laxatives. Associated symptoms: constipation   Associated symptoms: no diarrhea, no dysuria, no fatigue and no vomiting   Constipation Associated symptoms: abdominal pain   Associated symptoms: no diarrhea, no dysuria and no vomiting     Past Medical History  Diagnosis Date  . Diabetes mellitus   . Hypertension   . Hyperlipidemia   . History of melanoma   . Family history of diabetes mellitus   . GERD (gastroesophageal reflux disease)   . Anxiety    Past Surgical History  Procedure Laterality Date   . Appendectomy    . Renal artery stent  2006  . Coronary stent placement  2004  . Nm myoview ltd  2010   Family History  Problem Relation Age of Onset  . Pancreatic cancer Mother   . Coronary artery disease Father   . Diabetes Brother   . Colon cancer Sister   . Stroke Sister   . Stroke Sister   . Diabetes Son   . Stroke Son   . Schizophrenia Daughter    History  Substance Use Topics  . Smoking status: Never Smoker   . Smokeless tobacco: Never Used  . Alcohol Use: No   OB History    Gravida Para Term Preterm AB TAB SAB Ectopic Multiple Living   3 3 3       3      Review of Systems  Constitutional: Negative for fatigue and unexpected weight change.  Gastrointestinal: Positive for abdominal pain and constipation. Negative for vomiting and diarrhea.  Genitourinary: Negative for dysuria.  All other systems reviewed and are negative.     Allergies  Codeine and Sulfonamide derivatives  Home Medications   Prior to Admission medications   Medication Sig Start Date End Date Taking? Authorizing Provider  acetaminophen (TYLENOL) 500 MG tablet Take 500 mg by mouth every 6 (six) hours as needed for moderate pain.   Yes Historical Provider, MD  amLODipine (NORVASC) 5 MG tablet Take 5 mg by mouth daily.   Yes Historical Provider, MD  aspirin 81 MG  tablet Take 81 mg by mouth daily.     Yes Historical Provider, MD  atorvastatin (LIPITOR) 80 MG tablet Take 80 mg by mouth daily.    Yes Historical Provider, MD  glipiZIDE (GLUCOTROL) 10 MG tablet Take 5-10 mg by mouth 2 (two) times daily.    Yes Historical Provider, MD  LORazepam (ATIVAN) 0.5 MG tablet Take 1 tablet by mouth 3 times/day as needed-between meals & bedtime for anxiety or sleep.  01/13/14  Yes Historical Provider, MD  losartan (COZAAR) 25 MG tablet Take 25 mg by mouth daily.   Yes Historical Provider, MD  metoprolol (LOPRESSOR) 50 MG tablet Take 1 tablet by mouth 2 (two) times daily. 12/24/13  Yes Historical Provider, MD   omeprazole (PRILOSEC) 20 MG capsule Take 20 mg by mouth daily.    Yes Historical Provider, MD  traMADol (ULTRAM) 50 MG tablet Take 1 tablet by mouth 4 (four) times daily as needed for moderate pain.  12/24/13  Yes Historical Provider, MD  lactulose (CHRONULAC) 10 GM/15ML solution Take 15 mLs (10 g total) by mouth 2 (two) times daily. 09/08/14   Dorie Rank, MD   BP 168/66 mmHg  Pulse 79  Temp(Src) 97.7 F (36.5 C) (Oral)  Resp 18  Ht 5' 5.5" (1.664 m)  Wt 136 lb (61.689 kg)  BMI 22.28 kg/m2  SpO2 95% Physical Exam  Constitutional: No distress.  HENT:  Head: Normocephalic and atraumatic.  Right Ear: External ear normal.  Left Ear: External ear normal.  Eyes: Conjunctivae are normal. Right eye exhibits no discharge. Left eye exhibits no discharge. No scleral icterus.  Neck: Neck supple. No tracheal deviation present.  Cardiovascular: Normal rate, regular rhythm and intact distal pulses.   Pulmonary/Chest: Effort normal and breath sounds normal. No stridor. No respiratory distress. She has no wheezes. She has no rales.  Abdominal: Soft. Bowel sounds are normal. She exhibits no distension. There is no tenderness. There is no rebound and no guarding.  Genitourinary:  No rectal mass, no fecal impaction  Musculoskeletal: She exhibits no edema or tenderness.  Neurological: She is alert. She has normal strength. No cranial nerve deficit (no facial droop, extraocular movements intact, no slurred speech) or sensory deficit. She exhibits normal muscle tone. She displays no seizure activity. Coordination normal.  Skin: Skin is warm and dry. No rash noted. She is not diaphoretic.  Psychiatric: She has a normal mood and affect.  Nursing note and vitals reviewed.   ED Course  Procedures (including critical care time) Labs Review Labs Reviewed  COMPREHENSIVE METABOLIC PANEL - Abnormal; Notable for the following:    Glucose, Bld 102 (*)    BUN 26 (*)    Creatinine, Ser 1.27 (*)    GFR calc non  Af Amer 36 (*)    GFR calc Af Amer 41 (*)    All other components within normal limits  CBC WITH DIFFERENTIAL/PLATELET  LIPASE, BLOOD  POC OCCULT BLOOD, ED    Xray report reviewed.  Prominent stool in colon  MDM   Final diagnoses:  Constipation, unspecified constipation type    Consistent with constipation.  No signs of bowel obstruction.  DC home with gi referral. Consider colonoscopy.  Rx lactulose    Dorie Rank, MD 09/12/14 [phone]

## 2014-09-08 NOTE — ED Notes (Signed)
Patient complaining of abdominal pain radiating into her back, and constipation "for over a month".  States she has taken enemas with no relief.

## 2014-09-08 NOTE — Discharge Instructions (Signed)

## 2014-12-14 ENCOUNTER — Ambulatory Visit (HOSPITAL_COMMUNITY): Payer: Medicare Other | Attending: Internal Medicine | Admitting: Physical Therapy

## 2014-12-14 ENCOUNTER — Telehealth (HOSPITAL_COMMUNITY): Payer: Self-pay | Admitting: Physical Therapy

## 2014-12-14 NOTE — Telephone Encounter (Signed)
Talked with pt -her grandson died and she can't get over it, she will call us back when she wants to reschedule.

## 2016-06-09 ENCOUNTER — Encounter (HOSPITAL_COMMUNITY): Payer: Self-pay | Admitting: Emergency Medicine

## 2016-06-09 ENCOUNTER — Emergency Department (HOSPITAL_COMMUNITY): Payer: Medicare Other

## 2016-06-09 ENCOUNTER — Emergency Department (HOSPITAL_COMMUNITY)
Admission: EM | Admit: 2016-06-09 | Discharge: 2016-06-09 | Disposition: A | Discharge status: Home / Self Care | Payer: Medicare Other | Attending: Emergency Medicine | Admitting: Emergency Medicine

## 2016-06-09 DIAGNOSIS — Z7982 Long term (current) use of aspirin: Secondary | ICD-10-CM | POA: Insufficient documentation

## 2016-06-09 DIAGNOSIS — K5900 Constipation, unspecified: Secondary | ICD-10-CM | POA: Insufficient documentation

## 2016-06-09 DIAGNOSIS — I2510 Atherosclerotic heart disease of native coronary artery without angina pectoris: Secondary | ICD-10-CM | POA: Diagnosis not present

## 2016-06-09 DIAGNOSIS — E1122 Type 2 diabetes mellitus with diabetic chronic kidney disease: Secondary | ICD-10-CM | POA: Diagnosis not present

## 2016-06-09 DIAGNOSIS — I252 Old myocardial infarction: Secondary | ICD-10-CM | POA: Diagnosis not present

## 2016-06-09 DIAGNOSIS — Z79899 Other long term (current) drug therapy: Secondary | ICD-10-CM | POA: Diagnosis not present

## 2016-06-09 DIAGNOSIS — Z7984 Long term (current) use of oral hypoglycemic drugs: Secondary | ICD-10-CM | POA: Insufficient documentation

## 2016-06-09 DIAGNOSIS — N182 Chronic kidney disease, stage 2 (mild): Secondary | ICD-10-CM | POA: Diagnosis not present

## 2016-06-09 DIAGNOSIS — I129 Hypertensive chronic kidney disease with stage 1 through stage 4 chronic kidney disease, or unspecified chronic kidney disease: Secondary | ICD-10-CM | POA: Diagnosis not present

## 2016-06-09 NOTE — ED Provider Notes (Signed)
[facility] DEPT Provider Note   CSN: [id number] Arrival date & time: 06/09/16  0900     History   Chief Complaint Chief Complaint  Patient presents with  . Constipation    HPI Regina Good is a 83 y.o. female.  HPI  Pt was seen at 0920. Per pt, c/o gradual onset and persistence of constant "constipation" for the past 4 days. Pt was evaluated by her PMD for same, rx polyethylene glycol.  Pt states she has passed "just water" after taking it as prescribed. Pt states she has taken several enemas with the same results. Denies rectal pain, no abd pain, no rectal bleeding or drainage, no fevers. Pt states she was evaluated in the ED 2 years ago for this same complaint.   Past Medical History:  Diagnosis Date  . Anxiety   . Diabetes mellitus   . Family history of diabetes mellitus   . GERD (gastroesophageal reflux disease)   . History of melanoma   . Hyperlipidemia   . Hypertension     Patient Active Problem List   Diagnosis Date Noted  . ACUTE CYSTITIS 07/27/2008  . ALLERGIC RHINITIS 05/22/2008  . CHRONIC KIDNEY DISEASE STAGE III (MODERATE) 02/18/2008  . DIABETES MELLITUS, TYPE II, CONTROLLED 02/04/2008  . HYPERLIPIDEMIA 02/04/2008  . ANXIETY 02/04/2008  . HYPERTENSION 02/04/2008  . MYOCARDIAL INFARCTION, HX OF 02/04/2008  . CAD 02/04/2008  . GERD 02/04/2008  . CAROTID BRUIT, RIGHT 02/04/2008  . MELANOMA, HX OF 02/04/2008    Past Surgical History:  Procedure Laterality Date  . APPENDECTOMY    . CORONARY STENT PLACEMENT  2004  . NM MYOVIEW LTD  2010  . RENAL ARTERY STENT  2006    OB History    Gravida Para Term Preterm AB Living   3 3 3     3    SAB TAB Ectopic Multiple Live Births                   Home Medications    Prior to Admission medications   Medication Sig Start Date End Date Taking? Authorizing Provider  acetaminophen (TYLENOL) 500 MG tablet Take 500 mg by mouth every 6 (six) hours as needed for moderate pain.    Historical Provider, MD    amLODipine (NORVASC) 5 MG tablet Take 5 mg by mouth daily.    Historical Provider, MD  aspirin 81 MG tablet Take 81 mg by mouth daily.      Historical Provider, MD  atorvastatin (LIPITOR) 80 MG tablet Take 80 mg by mouth daily.     Historical Provider, MD  glipiZIDE (GLUCOTROL) 10 MG tablet Take 5-10 mg by mouth 2 (two) times daily.     Historical Provider, MD  lactulose (CHRONULAC) 10 GM/15ML solution Take 15 mLs (10 g total) by mouth 2 (two) times daily. 09/08/14   Dorie Rank, MD  LORazepam (ATIVAN) 0.5 MG tablet Take 1 tablet by mouth 3 times/day as needed-between meals & bedtime for anxiety or sleep.  01/13/14   Historical Provider, MD  losartan (COZAAR) 25 MG tablet Take 25 mg by mouth daily.    Historical Provider, MD  metoprolol (LOPRESSOR) 50 MG tablet Take 1 tablet by mouth 2 (two) times daily. 12/24/13   Historical Provider, MD  omeprazole (PRILOSEC) 20 MG capsule Take 20 mg by mouth daily.     Historical Provider, MD  traMADol (ULTRAM) 50 MG tablet Take 1 tablet by mouth 4 (four) times daily as needed for moderate pain.  12/24/13  Historical Provider, MD    Family History Family History  Problem Relation Age of Onset  . Pancreatic cancer Mother   . Coronary artery disease Father   . Diabetes Brother   . Colon cancer Sister   . Stroke Sister   . Stroke Sister   . Diabetes Son   . Stroke Son   . Schizophrenia Daughter     Social History Social History  Substance Use Topics  . Smoking status: Never Smoker  . Smokeless tobacco: Never Used  . Alcohol use No     Allergies   Codeine and Sulfonamide derivatives   Review of Systems Review of Systems ROS: Statement: All systems negative except as marked or noted in the HPI; Constitutional: Negative for fever and chills. ; ; Eyes: Negative for eye pain, redness and discharge. ; ; ENMT: Negative for ear pain, hoarseness, nasal congestion, sinus pressure and sore throat. ; ; Cardiovascular: Negative for chest pain,  palpitations, diaphoresis, dyspnea and peripheral edema. ; ; Respiratory: Negative for cough, wheezing and stridor. ; ; Gastrointestinal: +constipation. Negative for nausea, vomiting, diarrhea, abdominal pain, blood in stool, hematemesis, jaundice and rectal bleeding. . ; ; Genitourinary: Negative for dysuria, flank pain and hematuria. ; ; Musculoskeletal: Negative for back pain and neck pain. Negative for swelling and trauma.; ; Skin: Negative for pruritus, rash, abrasions, blisters, bruising and skin lesion.; ; Neuro: Negative for headache, lightheadedness and neck stiffness. Negative for weakness, altered level of consciousness, altered mental status, extremity weakness, paresthesias, involuntary movement, seizure and syncope.       Physical Exam Updated Vital Signs BP (!) 193/63 (BP Location: Right Arm)   Pulse 63   Temp 97.7 F (36.5 C) (Oral)   Resp 18   Ht 5\' 6"  (1.676 m)   Wt 130 lb (59 kg)   SpO2 100%   BMI 20.98 kg/m   Physical Exam 0925: Physical examination:  Nursing notes reviewed; Vital signs and O2 SAT reviewed;  Constitutional: Well developed, Well nourished, Well hydrated, In no acute distress; Head:  Normocephalic, atraumatic; Eyes: EOMI, PERRL, No scleral icterus; ENMT: Mouth and pharynx normal, Mucous membranes moist; Neck: Supple, Full range of motion, No lymphadenopathy; Cardiovascular: Regular rate and rhythm, No gallop; Respiratory: Breath sounds clear & equal bilaterally, No wheezes.  Speaking full sentences with ease, Normal respiratory effort/excursion; Chest: Nontender, Movement normal; Abdomen: Soft, Nontender, Nondistended, Normal bowel sounds. Rectal exam performed w/permission of pt and ED RN chaperone present.  Anal tone normal.  Non-tender, soft brown stool in rectal vault, no fecal impaction.  No fissures, no external hemorrhoids, no palp masses.; Genitourinary: No CVA tenderness; Extremities: Pulses normal, No tenderness, No edema, No calf edema or asymmetry.;  Neuro: AA&Ox3, Major CN grossly intact.  Speech clear. No gross focal motor or sensory deficits in extremities.; Skin: Color normal, Warm, Dry.   ED Treatments / Results  Labs (all labs ordered are listed, but only abnormal results are displayed)   EKG  EKG Interpretation None       Radiology   Procedures Procedures (including critical care time)  Medications Ordered in ED Medications - No data to display   Initial Impression / Assessment and Plan / ED Course  I have reviewed the triage vital signs and the nursing notes.  Pertinent labs & imaging results that were available during my care of the patient were reviewed by me and considered in my medical decision making (see chart for details).  MDM Reviewed: previous chart, nursing note and vitals  Reviewed previous: x-ray Interpretation: x-ray   Dg Abd Acute W/chest Result Date: 06/09/2016 CLINICAL DATA:  Constipation.  Recent abdominal pain EXAM: DG ABDOMEN ACUTE W/ 1V CHEST COMPARISON:  Abdomen series September 08, 2014 FINDINGS: PA chest: There is no edema or consolidation. Heart size and pulmonary vascularity are normal. No adenopathy. There is atherosclerotic calcification in the aorta. Supine and upright abdomen: There is moderate stool throughout the colon. There is no bowel dilatation or air-fluid levels suggesting bowel obstruction. No free air. There is aortic atherosclerotic calcification. There are phleboliths in the pelvis. IMPRESSION: Moderate stool throughout the colon. No bowel obstruction or free air. There is aortic atherosclerosis. There is no lung edema or consolidation. Electronically Signed   By: Lowella Grip III M.D.   On: 06/09/2016 10:05    1025:  Pt concerned she has a "bowel blockage;" XR reassuring. Abd remains benign. No fecal impaction. Pt states she wants to go home now. Continue to tx symptomatically at this time. Dx and testing d/w pt.  Questions answered.  Verb understanding, agreeable to d/c  home with outpt f/u.    Final Clinical Impressions(s) / ED Diagnoses   Final diagnoses:  None    New Prescriptions New Prescriptions   No medications on file     Francine Graven, DO 06/13/16 1651

## 2016-06-09 NOTE — ED Notes (Signed)
Pt taken to xray 

## 2016-06-09 NOTE — Discharge Instructions (Signed)
Take over the counter laxative (such as miralax, milk of magnesia, senokot) AND a dulcolax suppository today and repeat both tomorrow.  Begin to take over the counter stool softener (colace), as directed on packaging, for the next month.  Continue to take your usual prescriptions as previously directed.  Call your regular medical doctor today to schedule a follow up appointment this week.  Return to the Emergency Department immediately if worsening.

## 2016-06-09 NOTE — ED Notes (Signed)
Pt upset and stating these meds will not work and she has already tried it. EDP back in to talk with pt

## 2016-06-09 NOTE — ED Triage Notes (Addendum)
Pt reports no BM x4 days. pt was seen at pcp for same and was prescribed polyethylene glycol. Pt reports no relief. Pt reports is having flatus. Pt denies pain and reports has tried OTC and enema at home with no relief. No abdominal tenderness noted in triage. nad noted.

## 2016-06-09 NOTE — ED Notes (Signed)
Pt returned from xray

## 2017-09-02 ENCOUNTER — Encounter (HOSPITAL_COMMUNITY): Payer: Self-pay | Admitting: Emergency Medicine

## 2017-09-02 ENCOUNTER — Other Ambulatory Visit: Payer: Self-pay

## 2017-09-02 ENCOUNTER — Emergency Department (HOSPITAL_COMMUNITY)
Admission: EM | Admit: 2017-09-02 | Discharge: 2017-09-02 | Disposition: A | Discharge status: Home / Self Care | Payer: Medicare Other | Attending: Emergency Medicine | Admitting: Emergency Medicine

## 2017-09-02 DIAGNOSIS — E1122 Type 2 diabetes mellitus with diabetic chronic kidney disease: Secondary | ICD-10-CM | POA: Insufficient documentation

## 2017-09-02 DIAGNOSIS — R300 Dysuria: Secondary | ICD-10-CM | POA: Diagnosis not present

## 2017-09-02 DIAGNOSIS — Z7982 Long term (current) use of aspirin: Secondary | ICD-10-CM | POA: Insufficient documentation

## 2017-09-02 DIAGNOSIS — I129 Hypertensive chronic kidney disease with stage 1 through stage 4 chronic kidney disease, or unspecified chronic kidney disease: Secondary | ICD-10-CM | POA: Diagnosis not present

## 2017-09-02 DIAGNOSIS — N183 Chronic kidney disease, stage 3 (moderate): Secondary | ICD-10-CM | POA: Insufficient documentation

## 2017-09-02 DIAGNOSIS — Z955 Presence of coronary angioplasty implant and graft: Secondary | ICD-10-CM | POA: Insufficient documentation

## 2017-09-02 DIAGNOSIS — Z7984 Long term (current) use of oral hypoglycemic drugs: Secondary | ICD-10-CM | POA: Insufficient documentation

## 2017-09-02 DIAGNOSIS — I2510 Atherosclerotic heart disease of native coronary artery without angina pectoris: Secondary | ICD-10-CM | POA: Insufficient documentation

## 2017-09-02 DIAGNOSIS — Z79899 Other long term (current) drug therapy: Secondary | ICD-10-CM | POA: Diagnosis not present

## 2017-09-02 DIAGNOSIS — R339 Retention of urine, unspecified: Secondary | ICD-10-CM | POA: Diagnosis present

## 2017-09-02 LAB — URINALYSIS, ROUTINE W REFLEX MICROSCOPIC
Bacteria, UA: NONE SEEN
Bilirubin Urine: NEGATIVE
GLUCOSE, UA: NEGATIVE mg/dL
Ketones, ur: NEGATIVE mg/dL
LEUKOCYTES UA: NEGATIVE
Nitrite: NEGATIVE
Protein, ur: NEGATIVE mg/dL
SPECIFIC GRAVITY, URINE: 1.01 (ref 1.005–1.030)
pH: 6 (ref 5.0–8.0)

## 2017-09-02 LAB — CBC WITH DIFFERENTIAL/PLATELET
Basophils Absolute: 0 10*3/uL (ref 0.0–0.1)
Basophils Relative: 0 %
Eosinophils Absolute: 0.3 10*3/uL (ref 0.0–0.7)
Eosinophils Relative: 4 %
HEMATOCRIT: 35.7 % — AB (ref 36.0–46.0)
HEMOGLOBIN: 11.1 g/dL — AB (ref 12.0–15.0)
LYMPHS PCT: 17 %
Lymphs Abs: 1.4 10*3/uL (ref 0.7–4.0)
MCH: 26.7 pg (ref 26.0–34.0)
MCHC: 31.1 g/dL (ref 30.0–36.0)
MCV: 85.8 fL (ref 78.0–100.0)
MONO ABS: 0.5 10*3/uL (ref 0.1–1.0)
MONOS PCT: 6 %
NEUTROS ABS: 6 10*3/uL (ref 1.7–7.7)
NEUTROS PCT: 73 %
Platelets: 341 10*3/uL (ref 150–400)
RBC: 4.16 MIL/uL (ref 3.87–5.11)
RDW: 13.9 % (ref 11.5–15.5)
WBC: 8.2 10*3/uL (ref 4.0–10.5)

## 2017-09-02 LAB — BASIC METABOLIC PANEL
ANION GAP: 9 (ref 5–15)
BUN: 22 mg/dL — ABNORMAL HIGH (ref 6–20)
CO2: 26 mmol/L (ref 22–32)
Calcium: 8.5 mg/dL — ABNORMAL LOW (ref 8.9–10.3)
Chloride: 99 mmol/L — ABNORMAL LOW (ref 101–111)
Creatinine, Ser: 1.34 mg/dL — ABNORMAL HIGH (ref 0.44–1.00)
GFR calc Af Amer: 38 mL/min — ABNORMAL LOW (ref >60)
GFR calc non Af Amer: 33 mL/min — ABNORMAL LOW (ref >60)
GLUCOSE: 112 mg/dL — AB (ref 65–99)
POTASSIUM: 4.1 mmol/L (ref 3.5–5.1)
Sodium: 134 mmol/L — ABNORMAL LOW (ref 135–145)

## 2017-09-02 NOTE — ED Provider Notes (Signed)
  Face-to-face evaluation   History: She presents for evaluation of sensation of being unable to void, and pain with urination, present for 1 month.  During this time she has been treated for UTI several times.  This treatment was by her PCP.  She does not see a urologist or see a gynecologist, at this time.  Physical exam: Alert elderly female.  She does not appear uncomfortable.  External female genitalia, examined, she has moderate typical atrophy, and some erythema of the introitus, which may be the source of her discomfort with urination.  Hygiene appears fair but there was a small amount of drainage in her underwear.  This is yellow in color like urine and may indicate urinary incontinence.  Medical screening examination/treatment/procedure(s) were conducted as a shared visit with non-physician practitioner(s) and myself.  I personally evaluated the patient during the encounter    Daleen Bo, MD 09/03/17 [phone]

## 2017-09-02 NOTE — ED Provider Notes (Signed)
[facility] EMERGENCY DEPARTMENT Provider Note   CSN: [id number] Arrival date & time: 09/02/17  1531     History   Chief Complaint Chief Complaint  Patient presents with  . Urinary Retention    HPI Regina Good is a 84 y.o. female with a history of well controlled DM, GERD, HTN vascular disease including cardiac and renal stents in place presenting with difficulty urinating.  She reports having a uri and has been on antibiotics starting 1 month before today, 3 week course, with improved sx until her abx ended 1 week ago.  She has had reduction in urine production and endorses dysuria symptoms.  Her last urine production was at 4 am today.  She denies abdominal pain, no n/v, no back or flank pain.  She did have chills overnight a week ago, since resolved, no known fevers.   She also being constipated but took a stool softener and has had 3 bm's today, no longer feeling constipated.  She has found no alleviators for her sx.  Reports generally reduced appetite but has been maintaining good fluid intake.  The history is provided by the patient.    Past Medical History:  Diagnosis Date  . Anxiety   . Diabetes mellitus   . Family history of diabetes mellitus   . GERD (gastroesophageal reflux disease)   . History of melanoma   . Hyperlipidemia   . Hypertension     Patient Active Problem List   Diagnosis Date Noted  . ACUTE CYSTITIS 07/27/2008  . ALLERGIC RHINITIS 05/22/2008  . CHRONIC KIDNEY DISEASE STAGE III (MODERATE) 02/18/2008  . DIABETES MELLITUS, TYPE II, CONTROLLED 02/04/2008  . HYPERLIPIDEMIA 02/04/2008  . ANXIETY 02/04/2008  . HYPERTENSION 02/04/2008  . MYOCARDIAL INFARCTION, HX OF 02/04/2008  . CAD 02/04/2008  . GERD 02/04/2008  . CAROTID BRUIT, RIGHT 02/04/2008  . MELANOMA, HX OF 02/04/2008    Past Surgical History:  Procedure Laterality Date  . APPENDECTOMY    . CORONARY STENT PLACEMENT  2004  . NM MYOVIEW LTD  2010  . RENAL ARTERY STENT  2006     OB  History    Gravida  3   Para  3   Term  3   Preterm      AB      Living  3     SAB      TAB      Ectopic      Multiple      Live Births               Home Medications    Prior to Admission medications   Medication Sig Start Date End Date Taking? Authorizing Provider  ALPRAZolam Duanne Moron) 0.25 MG tablet Take 1 tablet by mouth daily. 02/21/14   [provider]  aspirin 81 MG tablet Take 81 mg by mouth daily.      [provider]  glipiZIDE (GLUCOTROL) 10 MG tablet Take 5-10 mg by mouth 2 (two) times daily.     [provider]  metoprolol (LOPRESSOR) 50 MG tablet Take 1 tablet by mouth 2 (two) times daily. 12/24/13   [provider]  omeprazole (PRILOSEC) 20 MG capsule Take 20 mg by mouth daily.     [provider]  potassium chloride (K-DUR) 10 MEQ tablet Take 1 tablet by mouth daily. 05/21/16   [provider]    Family History Family History  Problem Relation Age of Onset  . Pancreatic cancer Mother   .  Coronary artery disease Father   . Diabetes Brother   . Colon cancer Sister   . Stroke Sister   . Stroke Sister   . Diabetes Son   . Stroke Son   . Schizophrenia Daughter     Social History Social History   Tobacco Use  . Smoking status: Never Smoker  . Smokeless tobacco: Never Used  Substance Use Topics  . Alcohol use: No  . Drug use: No     Allergies   Codeine and Sulfonamide derivatives   Review of Systems Review of Systems  Constitutional: Positive for appetite change and chills. Negative for fever.  HENT: Negative for congestion and sore throat.   Eyes: Negative.   Respiratory: Negative for chest tightness and shortness of breath.   Cardiovascular: Negative for chest pain.  Gastrointestinal: Negative for abdominal pain, nausea and vomiting.  Genitourinary: Positive for decreased urine volume, difficulty urinating, frequency and urgency. Negative for flank pain.  Musculoskeletal:  Negative for arthralgias, back pain, joint swelling and neck pain.  Skin: Negative.  Negative for rash and wound.  Neurological: Negative for dizziness, weakness, light-headedness, numbness and headaches.  Psychiatric/Behavioral: Negative.      Physical Exam Updated Vital Signs BP (!) 179/70   Pulse 70   Temp 98.7 F (37.1 C) (Oral)   Resp 16   Ht 5\' 5"  (1.651 m)   Wt 57.2 kg (126 lb)   SpO2 95%   BMI 20.97 kg/m   Physical Exam  Constitutional: She appears well-developed and well-nourished.  HENT:  Head: Normocephalic and atraumatic.  Eyes: Conjunctivae are normal.  Neck: Normal range of motion.  Cardiovascular: Normal rate, regular rhythm, normal heart sounds and intact distal pulses.  Pulmonary/Chest: Effort normal and breath sounds normal. She has no wheezes.  Abdominal: Soft. Bowel sounds are normal. She exhibits no mass. There is no tenderness. There is no guarding.  Genitourinary: There is no rash, tenderness or lesion on the right labia. There is no rash, tenderness or lesion on the left labia.  Genitourinary Comments: Mild increased external perineal erythema. No edema, no rash.   Musculoskeletal: Normal range of motion. She exhibits no edema.  No lower extremity edema.  Neurological: She is alert.  Skin: Skin is warm and dry.  Psychiatric: She has a normal mood and affect.  Nursing note and vitals reviewed.    ED Treatments / Results  Labs (all labs ordered are listed, but only abnormal results are displayed) Labs Reviewed  URINALYSIS, ROUTINE W REFLEX MICROSCOPIC - Abnormal; Notable for the following components:      Result Value   Color, Urine STRAW (*)    Hgb urine dipstick SMALL (*)    All other components within normal limits  BASIC METABOLIC PANEL - Abnormal; Notable for the following components:   Sodium 134 (*)    Chloride 99 (*)    Glucose, Bld 112 (*)    BUN 22 (*)    Creatinine, Ser 1.34 (*)    Calcium 8.5 (*)    GFR calc non Af Amer 33 (*)     GFR calc Af Amer 38 (*)    All other components within normal limits  CBC WITH DIFFERENTIAL/PLATELET - Abnormal; Notable for the following components:   Hemoglobin 11.1 (*)    HCT 35.7 (*)    All other components within normal limits    EKG None  Radiology No results found.  Procedures Procedures (including critical care time)  Medications Ordered in ED Medications - No  data to display   Initial Impression / Assessment and Plan / ED Course  I have reviewed the triage vital signs and the nursing notes.  Pertinent labs & imaging results that were available during my care of the patient were reviewed by me and considered in my medical decision making (see chart for details).     Labs reviewed and stable.  No retention, bladder scan 27 cc.  Advised f/u with urology - referral given.  External perineal exam with mild irritation appearance, may benefit from estrogen cream, but will defer to urology for eval. Suggested topical vaseline in the interim.  Pt was seen by Dr. Eulis Foster prior to dc.   Final Clinical Impressions(s) / ED Diagnoses   Final diagnoses:  Dysuria    ED Discharge Orders    None       Landis Martins 09/02/17 1816    Daleen Bo, MD 09/03/17 [phone]

## 2017-09-02 NOTE — ED Triage Notes (Signed)
Pt was treated for UTI last week.  Pt urinated at 6 am this morning ans states has not urinated since.  Pt c/o of pain in lower abdominal area with increased urge to urinate

## 2017-09-02 NOTE — Discharge Instructions (Addendum)
Your lab tests today are stable with no evidence of a kidney problem being the source of your symptoms. You also do not have a urinary infection today.  It is possible you have some external vaginal irritation causing some symptoms.  You may try using vaseline topically for symptom relief but feel you also would benefit from seeing a urologist - call for this appointment as discussed.

## 2017-09-02 NOTE — ED Notes (Signed)
Bladder scanner:  27 ml

## 2017-09-02 NOTE — ED Notes (Signed)
Gave patient water to drink

## 2017-09-24 ENCOUNTER — Other Ambulatory Visit: Payer: Self-pay

## 2017-09-24 ENCOUNTER — Emergency Department (HOSPITAL_COMMUNITY)
Admission: EM | Admit: 2017-09-24 | Discharge: 2017-09-24 | Disposition: A | Discharge status: Home / Self Care | Payer: Medicare Other | Attending: Emergency Medicine | Admitting: Emergency Medicine

## 2017-09-24 ENCOUNTER — Encounter (HOSPITAL_COMMUNITY): Payer: Self-pay | Admitting: Emergency Medicine

## 2017-09-24 ENCOUNTER — Emergency Department (HOSPITAL_COMMUNITY): Payer: Medicare Other

## 2017-09-24 DIAGNOSIS — Z7982 Long term (current) use of aspirin: Secondary | ICD-10-CM | POA: Diagnosis not present

## 2017-09-24 DIAGNOSIS — N183 Chronic kidney disease, stage 3 (moderate): Secondary | ICD-10-CM | POA: Insufficient documentation

## 2017-09-24 DIAGNOSIS — N390 Urinary tract infection, site not specified: Secondary | ICD-10-CM

## 2017-09-24 DIAGNOSIS — I129 Hypertensive chronic kidney disease with stage 1 through stage 4 chronic kidney disease, or unspecified chronic kidney disease: Secondary | ICD-10-CM | POA: Insufficient documentation

## 2017-09-24 DIAGNOSIS — Z79899 Other long term (current) drug therapy: Secondary | ICD-10-CM | POA: Insufficient documentation

## 2017-09-24 DIAGNOSIS — Z7984 Long term (current) use of oral hypoglycemic drugs: Secondary | ICD-10-CM | POA: Insufficient documentation

## 2017-09-24 DIAGNOSIS — I2510 Atherosclerotic heart disease of native coronary artery without angina pectoris: Secondary | ICD-10-CM | POA: Insufficient documentation

## 2017-09-24 DIAGNOSIS — E1122 Type 2 diabetes mellitus with diabetic chronic kidney disease: Secondary | ICD-10-CM | POA: Insufficient documentation

## 2017-09-24 DIAGNOSIS — R339 Retention of urine, unspecified: Secondary | ICD-10-CM | POA: Diagnosis present

## 2017-09-24 DIAGNOSIS — R319 Hematuria, unspecified: Secondary | ICD-10-CM | POA: Insufficient documentation

## 2017-09-24 LAB — CBC WITH DIFFERENTIAL/PLATELET
Abs Immature Granulocytes: 0.1 10*3/uL (ref 0.0–0.1)
BASOS PCT: 0 %
Basophils Absolute: 0 10*3/uL (ref 0.0–0.1)
EOS ABS: 0.1 10*3/uL (ref 0.0–0.7)
EOS PCT: 1 %
HEMATOCRIT: 34.6 % — AB (ref 36.0–46.0)
Hemoglobin: 10.6 g/dL — ABNORMAL LOW (ref 12.0–15.0)
IMMATURE GRANULOCYTES: 1 %
LYMPHS ABS: 1.9 10*3/uL (ref 0.7–4.0)
Lymphocytes Relative: 13 %
MCH: 26.6 pg (ref 26.0–34.0)
MCHC: 30.6 g/dL (ref 30.0–36.0)
MCV: 86.7 fL (ref 78.0–100.0)
MONOS PCT: 4 %
Monocytes Absolute: 0.6 10*3/uL (ref 0.1–1.0)
NEUTROS PCT: 81 %
Neutro Abs: 12.1 10*3/uL — ABNORMAL HIGH (ref 1.7–7.7)
PLATELETS: 237 10*3/uL (ref 150–400)
RBC: 3.99 MIL/uL (ref 3.87–5.11)
RDW: 14.4 % (ref 11.5–15.5)
WBC: 14.8 10*3/uL — ABNORMAL HIGH (ref 4.0–10.5)

## 2017-09-24 LAB — URINALYSIS, ROUTINE W REFLEX MICROSCOPIC
BILIRUBIN URINE: NEGATIVE
Glucose, UA: NEGATIVE mg/dL
KETONES UR: NEGATIVE mg/dL
Nitrite: NEGATIVE
PH: 5 (ref 5.0–8.0)
Protein, ur: NEGATIVE mg/dL
Specific Gravity, Urine: 1.014 (ref 1.005–1.030)

## 2017-09-24 LAB — BASIC METABOLIC PANEL
Anion gap: 9 (ref 5–15)
BUN: 18 mg/dL (ref 8–23)
CALCIUM: 8.6 mg/dL — AB (ref 8.9–10.3)
CO2: 27 mmol/L (ref 22–32)
CREATININE: 1.22 mg/dL — AB (ref 0.44–1.00)
Chloride: 98 mmol/L (ref 98–111)
GFR calc Af Amer: 42 mL/min — ABNORMAL LOW (ref >60)
GFR, EST NON AFRICAN AMERICAN: 36 mL/min — AB (ref >60)
GLUCOSE: 75 mg/dL (ref 70–99)
Potassium: 3.7 mmol/L (ref 3.5–5.1)
Sodium: 134 mmol/L — ABNORMAL LOW (ref 135–145)

## 2017-09-24 MED ORDER — SODIUM CHLORIDE 0.9 % IV SOLN
1.0000 g | Freq: Once | INTRAVENOUS | Status: AC
  Administered 2017-09-24: 1 g via INTRAVENOUS
  Filled 2017-09-24: qty 10

## 2017-09-24 MED ORDER — LACTATED RINGERS IV BOLUS
1000.0000 mL | Freq: Once | INTRAVENOUS | Status: AC
  Administered 2017-09-24: 1000 mL via INTRAVENOUS

## 2017-09-24 MED ORDER — CEFDINIR 300 MG PO CAPS: 300.0000 mg | ORAL_CAPSULE | Freq: Two times a day (BID) | ORAL | 0 refills | Status: AC

## 2017-09-24 NOTE — ED Triage Notes (Signed)
Patient from home, stating she's had a "bladder infection for the past month" and "have been to the doctor about 4 times" has not been able to urinate since this morning. C/o pressure and burning sensation. Has antibiotic with her that she just started taking yesterday. VSS in triage.

## 2017-09-24 NOTE — ED Provider Notes (Signed)
Patient placed in Quick Look pathway, seen and evaluated   Chief Complaint:Dysuria  HPI:   Patient having dysuria, pressure, and frequency.  She was given macrobid yesterday for a UTI.  She reports that she feels like she can't go since this morning.  It doesn't hurt.  She insists that the macrobid doesn't work.    ROS: No pain.  No fever. (one)  Physical Exam:   Gen: No distress  Neuro: Awake and Alert  Skin: Warm    Focused Exam: No distress.  Nervous   Initiation of care has begun. The patient has been counseled on the process, plan, and necessity for staying for the completion/evaluation, and the remainder of the medical screening examination    Ollen Gross 09/24/17 1546    Drenda Freeze, MD 09/24/17 2351

## 2017-09-24 NOTE — ED Notes (Signed)
Pt ambulatory to bathroom

## 2017-09-24 NOTE — ED Provider Notes (Signed)
[facility] EMERGENCY DEPARTMENT Provider Note   CSN: [id number] Arrival date & time: 09/24/17  1533   History   Chief Complaint Chief Complaint  Patient presents with  . Urinary Retention   HPI  Patient is a 84 year old female with history of HTN, DM, CKD, and CAD s/p PCI presenting to the ED for recurrent dysuria and urgency. She states she has had persistent dysuria and urinary urgency for the last 6 weeks. She states she has completed multiple courses of antibiotics over this time with transient improvement in symptoms. She states she has been hydrating aggressively but, despite this, she has been unable to urinate since 0300. She claims of mild nausea but no vomiting, diarrhea, or fever. Denies any abdominal or flank pain. She states she had some mild vaginal irritation last week which resolved with the topical ointment provided by her PCP. She denies any frothiness or fecal material in her urine. No other recent illness or injury.  Past Medical History:  Diagnosis Date  . Anxiety   . Diabetes mellitus   . Family history of diabetes mellitus   . GERD (gastroesophageal reflux disease)   . History of melanoma   . Hyperlipidemia   . Hypertension     Patient Active Problem List   Diagnosis Date Noted  . ACUTE CYSTITIS 07/27/2008  . ALLERGIC RHINITIS 05/22/2008  . CHRONIC KIDNEY DISEASE STAGE III (MODERATE) 02/18/2008  . DIABETES MELLITUS, TYPE II, CONTROLLED 02/04/2008  . HYPERLIPIDEMIA 02/04/2008  . ANXIETY 02/04/2008  . HYPERTENSION 02/04/2008  . MYOCARDIAL INFARCTION, HX OF 02/04/2008  . CAD 02/04/2008  . GERD 02/04/2008  . CAROTID BRUIT, RIGHT 02/04/2008  . MELANOMA, HX OF 02/04/2008    Past Surgical History:  Procedure Laterality Date  . APPENDECTOMY    . CORONARY STENT PLACEMENT  2004  . NM MYOVIEW LTD  2010  . RENAL ARTERY STENT  2006     OB History    Gravida  3   Para  3   Term  3   Preterm      AB      Living  3     SAB     TAB      Ectopic      Multiple      Live Births              Home Medications    Prior to Admission medications   Medication Sig Start Date End Date Taking? Authorizing Provider  aspirin 81 MG tablet Take 81 mg by mouth daily.     Yes [provider]  glipiZIDE (GLUCOTROL) 10 MG tablet Take 5-10 mg by mouth 2 (two) times daily.    Yes [provider]  metoprolol (LOPRESSOR) 50 MG tablet Take 1 tablet by mouth 2 (two) times daily. 12/24/13  Yes [provider]  nitrofurantoin, macrocrystal-monohydrate, (MACROBID) 100 MG capsule Take 100 mg by mouth daily. For 30 days 09/23/17  Yes [provider]  cefdinir (OMNICEF) 300 MG capsule Take 1 capsule (300 mg total) by mouth 2 (two) times daily for 10 days. 09/24/17 10/04/17  Prescilla Sours, MD    Family History Family History  Problem Relation Age of Onset  . Pancreatic cancer Mother   . Coronary artery disease Father   . Diabetes Brother   . Colon cancer Sister   . Stroke Sister   . Stroke Sister   . Diabetes Son   . Stroke Son   . Schizophrenia Daughter  Social History Social History   Tobacco Use  . Smoking status: Never Smoker  . Smokeless tobacco: Never Used  Substance Use Topics  . Alcohol use: No  . Drug use: No     Allergies   Codeine and Sulfonamide derivatives   Review of Systems Review of Systems  Constitutional: Negative for fever.  HENT: Negative for congestion and sore throat.   Eyes: Negative for visual disturbance.  Respiratory: Negative for shortness of breath.   Cardiovascular: Negative for chest pain.  Gastrointestinal: Positive for nausea. Negative for abdominal pain, diarrhea and vomiting.  Genitourinary: Positive for dysuria, frequency and urgency. Negative for flank pain and vaginal discharge.  Musculoskeletal: Negative for neck pain.  Neurological: Negative for syncope.  All other systems reviewed and are negative.    Physical  Exam Updated Vital Signs BP (!) 149/63   Pulse (!) 59   Temp 97.7 F (36.5 C) (Oral)   Resp 18   Ht 5\' 5"  (1.651 m)   Wt 57.2 kg (126 lb)   SpO2 100%   BMI 20.97 kg/m   Physical Exam  Constitutional: She is oriented to person, place, and time. No distress.  HENT:  Head: Normocephalic and atraumatic.  Mouth/Throat: Oropharynx is clear and moist.  Eyes: Pupils are equal, round, and reactive to light. Conjunctivae are normal.  Neck: Neck supple. No tracheal deviation present.  Cardiovascular: Normal rate, regular rhythm, normal heart sounds and intact distal pulses.  No murmur heard. Pulmonary/Chest: Effort normal and breath sounds normal. No stridor. No respiratory distress. She has no wheezes. She has no rales.  Abdominal: Soft. She exhibits no distension and no mass. There is no tenderness. There is no guarding and no CVA tenderness.  Musculoskeletal: She exhibits no edema or deformity.  Neurological: She is alert and oriented to person, place, and time.  Skin: Skin is warm and dry.  Psychiatric: She has a normal mood and affect. Her behavior is normal.  Nursing note and vitals reviewed.    ED Treatments / Results  Labs (all labs ordered are listed, but only abnormal results are displayed) Labs Reviewed  URINALYSIS, ROUTINE W REFLEX MICROSCOPIC - Abnormal; Notable for the following components:      Result Value   Color, Urine AMBER (*)    APPearance HAZY (*)    Hgb urine dipstick SMALL (*)    Leukocytes, UA MODERATE (*)    Bacteria, UA RARE (*)    Non Squamous Epithelial 0-5 (*)    All other components within normal limits  CBC WITH DIFFERENTIAL/PLATELET - Abnormal; Notable for the following components:   WBC 14.8 (*)    Hemoglobin 10.6 (*)    HCT 34.6 (*)    Neutro Abs 12.1 (*)    All other components within normal limits  BASIC METABOLIC PANEL - Abnormal; Notable for the following components:   Sodium 134 (*)    Creatinine, Ser 1.22 (*)    Calcium 8.6 (*)     GFR calc non Af Amer 36 (*)    GFR calc Af Amer 42 (*)    All other components within normal limits  URINE CULTURE   Radiology Ct Renal Stone Study  Result Date: 09/24/2017 CLINICAL DATA:  Dysuria. EXAM: CT ABDOMEN AND PELVIS WITHOUT CONTRAST TECHNIQUE: Multidetector CT imaging of the abdomen and pelvis was performed following the standard protocol without IV contrast. COMPARISON:  No comparison studies available. FINDINGS: Lower chest: 7 mm left lower lobe pulmonary nodule (4:6). Subpleural reticulation in the  lung bases raises the question of underlying fibrotic lung disease. Hepatobiliary: No focal abnormality in the liver on this study without intravenous contrast. There is no evidence for gallstones, gallbladder wall thickening, or pericholecystic fluid. No intrahepatic or extrahepatic biliary dilation. Pancreas: No focal mass lesion. No dilatation of the main duct. No intraparenchymal cyst. No peripancreatic edema. Spleen: No splenomegaly. No focal mass lesion. Adrenals/Urinary Tract: No adrenal nodule or mass. Left kidney is atrophic. Right kidney unremarkable on this noncontrast study. No evidence for hydroureter. The urinary bladder appears normal for the degree of distention. Stomach/Bowel: Mild wall thickening in the proximal stomach likely related to underdistention. Duodenum is normally positioned as is the ligament of Treitz. No small bowel wall thickening. No small bowel dilatation. The terminal ileum is normal. The appendix is normal. Advanced diverticular disease noted in the left colon without diverticulitis. Vascular/Lymphatic: There is abdominal aortic atherosclerosis without aneurysm. There is no gastrohepatic or hepatoduodenal ligament lymphadenopathy. No intraperitoneal or retroperitoneal lymphadenopathy. No pelvic sidewall lymphadenopathy. Reproductive: The uterus has normal CT imaging appearance. There is no adnexal mass. Other: No intraperitoneal free fluid. Musculoskeletal: No  worrisome lytic or sclerotic osseous abnormality. Degenerative changes are noted in the right hip. Superior endplate compression deformity noted at L3. Degenerative changes are apparent in the lower lumbar spine. IMPRESSION: 1. No acute findings in the abdomen or pelvis. No findings to explain the patient's history of dysuria. 2. Left renal atrophy without hydronephrosis. 3. Mild wall thickening in the proximal stomach may be related to underdistention. Gastritis or neoplasm not entirely excluded. Follow-up EGD or upper GI series could be used to further evaluate as warranted. 4.  Aortic Atherosclerois (ICD10-170.0) 5. Advanced colonic diverticulosis without diverticulitis. Electronically Signed   By: Misty Stanley M.D.   On: 09/24/2017 17:14    Procedures Procedures (including critical care time) EMERGENCY DEPARTMENT US RENAL EXAM  "Study: Limited Retroperitoneal Ultrasound of Kidneys" INDICATIONS: Hematuria Long and short axis of both kidneys were obtained. PERFORMED BY: Myself IMAGES ARCHIVED?: Yes LIMITATIONS: None VIEWS USED: Long axis, Short axis and Bladder  INTERPRETATION: No Hydronephrosis, decompressed bladder (less than 15 mL by measurement)  Medications Ordered in ED Medications  lactated ringers bolus 1,000 mL (0 mLs Intravenous Stopped 09/24/17 1820)  cefTRIAXone (ROCEPHIN) 1 g in sodium chloride 0.9 % 100 mL IVPB (0 g Intravenous Stopped 09/24/17 1820)    Initial Impression / Assessment and Plan / ED Course  I have reviewed the triage vital signs and the nursing notes.  Pertinent labs & imaging results that were available during my care of the patient were reviewed by me and considered in my medical decision making (see chart for details).  Clinical picture consistent with UTI. UA show signs of infection.She has no flank pain, fever, or vomiting to suggest pyelonephritis. Initial point of care renal ultrasound shows decompressed bladder, no hydronephrosis, and atrophic left  kidney. She is well-appearing and is able to urinate here in the ED. She did have mild leukocytosis. Renal function at baseline. On recheck after IV fluids and Rocephin, patient felt much better and preferred discharge. I feel this is reasonable and will discharge her on Omnicef for expanded coverage and PCP follow up.  Patient informed of all ED findings. Return precautions and follow up plan reviewed. All questions answered.   Final Clinical Impressions(s) / ED Diagnoses   Final diagnoses:  Urinary tract infection with hematuria, site unspecified    ED Discharge Orders        Ordered  cefdinir (OMNICEF) 300 MG capsule  2 times daily     09/24/17 1920       Prescilla Sours, MD 09/24/17 Carlene Coria, MD 09/24/17 2252

## 2017-09-24 NOTE — ED Notes (Signed)
Patient transported to CT 

## 2017-09-26 LAB — URINE CULTURE: Culture: 30000 — AB

## 2017-10-20 ENCOUNTER — Encounter: Payer: Self-pay | Admitting: Gastroenterology

## 2018-01-25 ENCOUNTER — Ambulatory Visit: Payer: Medicare Other | Admitting: Gastroenterology

## 2019-06-20 ENCOUNTER — Other Ambulatory Visit: Payer: Self-pay

## 2019-06-20 ENCOUNTER — Encounter (HOSPITAL_COMMUNITY): Payer: Self-pay | Admitting: Emergency Medicine

## 2019-06-20 ENCOUNTER — Emergency Department (HOSPITAL_COMMUNITY)
Admission: EM | Admit: 2019-06-20 | Discharge: 2019-06-20 | Disposition: A | Discharge status: Home / Self Care | Payer: Medicare Other | Attending: Emergency Medicine | Admitting: Emergency Medicine

## 2019-06-20 ENCOUNTER — Emergency Department (HOSPITAL_COMMUNITY): Payer: Medicare Other

## 2019-06-20 DIAGNOSIS — R079 Chest pain, unspecified: Secondary | ICD-10-CM

## 2019-06-20 DIAGNOSIS — E119 Type 2 diabetes mellitus without complications: Secondary | ICD-10-CM | POA: Diagnosis not present

## 2019-06-20 DIAGNOSIS — Z7984 Long term (current) use of oral hypoglycemic drugs: Secondary | ICD-10-CM | POA: Diagnosis not present

## 2019-06-20 DIAGNOSIS — I2510 Atherosclerotic heart disease of native coronary artery without angina pectoris: Secondary | ICD-10-CM | POA: Insufficient documentation

## 2019-06-20 DIAGNOSIS — I119 Hypertensive heart disease without heart failure: Secondary | ICD-10-CM | POA: Diagnosis not present

## 2019-06-20 DIAGNOSIS — R0789 Other chest pain: Secondary | ICD-10-CM | POA: Diagnosis present

## 2019-06-20 LAB — BASIC METABOLIC PANEL
Anion gap: 10 (ref 5–15)
BUN: 16 mg/dL (ref 8–23)
CO2: 26 mmol/L (ref 22–32)
Calcium: 8.9 mg/dL (ref 8.9–10.3)
Chloride: 99 mmol/L (ref 98–111)
Creatinine, Ser: 0.98 mg/dL (ref 0.44–1.00)
GFR calc Af Amer: 56 mL/min — ABNORMAL LOW (ref >60)
GFR calc non Af Amer: 48 mL/min — ABNORMAL LOW (ref >60)
Glucose, Bld: 229 mg/dL — ABNORMAL HIGH (ref 70–99)
Potassium: 4 mmol/L (ref 3.5–5.1)
Sodium: 135 mmol/L (ref 135–145)

## 2019-06-20 LAB — CBC
HCT: 42.6 % (ref 36.0–46.0)
Hemoglobin: 12.8 g/dL (ref 12.0–15.0)
MCH: 26.7 pg (ref 26.0–34.0)
MCHC: 30 g/dL (ref 30.0–36.0)
MCV: 88.9 fL (ref 80.0–100.0)
Platelets: 365 10*3/uL (ref 150–400)
RBC: 4.79 MIL/uL (ref 3.87–5.11)
RDW: 13.7 % (ref 11.5–15.5)
WBC: 8.2 10*3/uL (ref 4.0–10.5)
nRBC: 0 % (ref 0.0–0.2)

## 2019-06-20 LAB — TROPONIN I (HIGH SENSITIVITY)
Troponin I (High Sensitivity): 40 ng/L — ABNORMAL HIGH (ref <18)
Troponin I (High Sensitivity): 61 ng/L — ABNORMAL HIGH (ref <18)

## 2019-06-20 MED ORDER — SUCRALFATE 1 G PO TABS: 1.0000 g | ORAL_TABLET | Freq: Four times a day (QID) | ORAL | 0 refills | Status: DC | PRN

## 2019-06-20 MED ORDER — ASPIRIN 81 MG PO CHEW
324.0000 mg | CHEWABLE_TABLET | Freq: Once | ORAL | Status: AC
  Administered 2019-06-20: 08:00:00 324 mg via ORAL
  Filled 2019-06-20: qty 4

## 2019-06-20 NOTE — Discharge Instructions (Signed)
You were evaluated in the Emergency Department and after careful evaluation, we did not find any emergent condition requiring admission or further testing in the hospital.  As discussed, your labs showed a very small amount of heart strain today.  Your symptoms could be related to either your heart or your stomach.  You can use the Carafate medication at home but we also recommend close cardiology follow-up.  Please call the office number provided.  Please return to the Emergency Department if you experience any worsening of your condition.  We encourage you to follow up with a primary care provider.  Thank you for allowing [place] to be a part of your care.

## 2019-06-20 NOTE — ED Triage Notes (Signed)
PT c/o intermittent left sided chest pain radiating into her neck for the past 5 days or so. PT states she became diaphoretic starting last night and has had increased indigestion. PT c/o pain of a 7 on the scale of 0-10 and describes it as pressure.

## 2019-06-20 NOTE — ED Provider Notes (Signed)
[facility] Emergency Department Provider Note MRN:  [id number]  Arrival date & time: 06/20/19     Chief Complaint   Chest Pain   History of Present Illness   Regina Good is a 86 y.o. year-old female with a history of hypertension, diabetes, CAD presenting to the ED with chief complaint of chest pain.  Location: Central chest Duration: 5 to 7 days Onset: Sudden Timing: Intermittent Description: Pressure Severity: 7 out of 10 in severity Exacerbating/Alleviating Factors: None Associated Symptoms: Diaphoresis that occurred last night Pertinent Negatives: Denies cough, no fever, no shortness of breath, no abdominal pain, no leg pain or swelling   Review of Systems  A complete 10 system review of systems was obtained and all systems are negative except as noted in the HPI and PMH.   Patient's Health History    Past Medical History:  Diagnosis Date  . Anxiety   . Diabetes mellitus   . Family history of diabetes mellitus   . GERD (gastroesophageal reflux disease)   . History of melanoma   . Hyperlipidemia   . Hypertension     Past Surgical History:  Procedure Laterality Date  . APPENDECTOMY    . CORONARY STENT PLACEMENT  2004  . NM MYOVIEW LTD  2010  . RENAL ARTERY STENT  2006    Family History  Problem Relation Age of Onset  . Pancreatic cancer Mother   . Coronary artery disease Father   . Diabetes Brother   . Colon cancer Sister   . Stroke Sister   . Stroke Sister   . Diabetes Son   . Stroke Son   . Schizophrenia Daughter     Social History   Socioeconomic History  . Marital status: Widowed    Spouse name: Not on file  . Number of children: Not on file  . Years of education: Not on file  . Highest education level: Not on file  Occupational History  . Occupation: Retired, previously a Armed forces logistics/support/administrative officer  . Smoking status: Never Smoker  . Smokeless tobacco: Never Used  Substance and Sexual Activity  . Alcohol use: No  .  Drug use: No  . Sexual activity: Not on file  Other Topics Concern  . Not on file  Social History Narrative   Lives with spouse who is 90 and schizophrenic daughter   Social Determinants of Radio broadcast assistant Strain:   . Difficulty of Paying Living Expenses:   Food Insecurity:   . Worried About Charity fundraiser in the Last Year:   . Arboriculturist in the Last Year:   Transportation Needs:   . Film/video editor (Medical):   Marland Kitchen Lack of Transportation (Non-Medical):   Physical Activity:   . Days of Exercise per Week:   . Minutes of Exercise per Session:   Stress:   . Feeling of Stress :   Social Connections:   . Frequency of Communication with Friends and Family:   . Frequency of Social Gatherings with Friends and Family:   . Attends Religious Services:   . Active Member of Clubs or Organizations:   . Attends Archivist Meetings:   Marland Kitchen Marital Status:   Intimate Partner Violence:   . Fear of Current or Ex-Partner:   . Emotionally Abused:   Marland Kitchen Physically Abused:   . Sexually Abused:      Physical Exam   Vitals:   06/20/19 1000 06/20/19 1100  BP: Marland Kitchen)  198/95   Pulse: 65 94  Resp: (!) 22 20  Temp:    SpO2: 99% 100%    CONSTITUTIONAL: Well-appearing, NAD NEURO:  Alert and oriented x 3, no focal deficits EYES:  eyes equal and reactive ENT/NECK:  no LAD, no JVD CARDIO: Regular rate, well-perfused, normal S1 and S2 PULM:  CTAB no wheezing or rhonchi GI/GU:  normal bowel sounds, non-distended, non-tender MSK/SPINE:  No gross deformities, no edema SKIN:  no rash, atraumatic PSYCH:  Appropriate speech and behavior  *Additional and/or pertinent findings included in MDM below  Diagnostic and Interventional Summary    EKG Interpretation  Date/Time:  Monday June 20 2019 07:40:48 EDT Ventricular Rate:  105 PR Interval:    QRS Duration: 146 QT Interval:  416 QTC Calculation: 550 R Axis:   -37 Text Interpretation: Sinus tachycardia with  irregular rate Left bundle branch block Confirmed by Gerlene Fee [phone]) on 06/20/2019 7:42:36 AM     Left bundle is new compared to prior EKG on record from 2008 [place] - Abnormal; Notable for the following components:      Result Value   Glucose, Bld 229 (*)    GFR calc non Af Amer 48 (*)    GFR calc Af Amer 56 (*)    All other components within normal limits  TROPONIN I (HIGH SENSITIVITY) - Abnormal; Notable for the following components:   Troponin I (High Sensitivity) 40 (*)    All other components within normal limits  TROPONIN I (HIGH SENSITIVITY) - Abnormal; Notable for the following components:   Troponin I (High Sensitivity) 61 (*)    All other components within normal limits  CBC    DG Chest Port 1 View  Final Result      Medications  aspirin chewable tablet 324 mg (324 mg Oral Given 06/20/19 [id number])     Procedures  /  Critical Care Procedures  ED Course and Medical Decision Making  I have reviewed the triage vital signs, the nursing notes, and pertinent available records from the EMR.  Pertinent labs & imaging results that were available during my care of the patient were reviewed by me and considered in my medical decision making (see below for details).     Chest pain with diaphoresis in this 86 year old female with history of CAD, heart attack back in 1999, no ischemic evaluation for years.  No pain currently, normal vital signs, well-appearing, no evidence of DVT, doubt PE.  EKG revealing left bundle but is new compared to prior EKG from 2008, will evaluate with troponins.  Troponins are minimally elevated, slightly rising but not definitively in ischemic range.  I discussed these findings with the patient with the patient's son present.  Both patient and son expressed understanding that her pain could be related to her heart.  Admission for observation and/or stress testing offered/recommended, however patient desires to go home, and at  the age of 47 I feel this is appropriate.  Strongly advised close cardiology follow-up, strict return precautions discussed.  Barth Kirks. Sedonia Small, MD [place] [email or web address]  Final Clinical Impressions(s) / ED Diagnoses     ICD-10-CM   1. Chest pain, unspecified type  R07.9     ED Discharge Orders    None       Discharge Instructions Discussed with and Provided to Patient:     Discharge Instructions     You were evaluated in the Emergency Department and after  careful evaluation, we did not find any emergent condition requiring admission or further testing in the hospital.  As discussed, your labs showed a very small amount of heart strain today.  Your symptoms could be related to either your heart or your stomach.  You can use the Carafate medication at home but we also recommend close cardiology follow-up.  Please call the office number provided.  Please return to the Emergency Department if you experience any worsening of your condition.  We encourage you to follow up with a primary care provider.  Thank you for allowing [place] to be a part of your care.        Maudie Flakes, MD 06/20/19 1128

## 2019-07-13 ENCOUNTER — Emergency Department (HOSPITAL_COMMUNITY)
Admission: EM | Admit: 2019-07-13 | Discharge: 2019-07-13 | Disposition: A | Discharge status: Home / Self Care | Payer: Medicare Other | Attending: Emergency Medicine | Admitting: Emergency Medicine

## 2019-07-13 ENCOUNTER — Emergency Department (HOSPITAL_COMMUNITY): Payer: Medicare Other

## 2019-07-13 ENCOUNTER — Other Ambulatory Visit: Payer: Self-pay

## 2019-07-13 ENCOUNTER — Encounter (HOSPITAL_COMMUNITY): Payer: Self-pay | Admitting: Emergency Medicine

## 2019-07-13 DIAGNOSIS — Z79899 Other long term (current) drug therapy: Secondary | ICD-10-CM | POA: Insufficient documentation

## 2019-07-13 DIAGNOSIS — K219 Gastro-esophageal reflux disease without esophagitis: Secondary | ICD-10-CM | POA: Insufficient documentation

## 2019-07-13 DIAGNOSIS — R7989 Other specified abnormal findings of blood chemistry: Secondary | ICD-10-CM | POA: Diagnosis not present

## 2019-07-13 DIAGNOSIS — Z7984 Long term (current) use of oral hypoglycemic drugs: Secondary | ICD-10-CM | POA: Diagnosis not present

## 2019-07-13 DIAGNOSIS — R778 Other specified abnormalities of plasma proteins: Secondary | ICD-10-CM

## 2019-07-13 DIAGNOSIS — E119 Type 2 diabetes mellitus without complications: Secondary | ICD-10-CM | POA: Insufficient documentation

## 2019-07-13 DIAGNOSIS — I219 Acute myocardial infarction, unspecified: Secondary | ICD-10-CM | POA: Diagnosis not present

## 2019-07-13 DIAGNOSIS — R0789 Other chest pain: Secondary | ICD-10-CM | POA: Diagnosis not present

## 2019-07-13 DIAGNOSIS — I10 Essential (primary) hypertension: Secondary | ICD-10-CM | POA: Diagnosis not present

## 2019-07-13 LAB — CBC
HCT: 38.6 % (ref 36.0–46.0)
Hemoglobin: 11.8 g/dL — ABNORMAL LOW (ref 12.0–15.0)
MCH: 26.8 pg (ref 26.0–34.0)
MCHC: 30.6 g/dL (ref 30.0–36.0)
MCV: 87.7 fL (ref 80.0–100.0)
Platelets: 297 10*3/uL (ref 150–400)
RBC: 4.4 MIL/uL (ref 3.87–5.11)
RDW: 14.3 % (ref 11.5–15.5)
WBC: 8.4 10*3/uL (ref 4.0–10.5)
nRBC: 0 % (ref 0.0–0.2)

## 2019-07-13 LAB — COMPREHENSIVE METABOLIC PANEL
ALT: 15 U/L (ref 0–44)
AST: 23 U/L (ref 15–41)
Albumin: 3.6 g/dL (ref 3.5–5.0)
Alkaline Phosphatase: 53 U/L (ref 38–126)
Anion gap: 14 (ref 5–15)
BUN: 19 mg/dL (ref 8–23)
CO2: 25 mmol/L (ref 22–32)
Calcium: 9.2 mg/dL (ref 8.9–10.3)
Chloride: 97 mmol/L — ABNORMAL LOW (ref 98–111)
Creatinine, Ser: 1.16 mg/dL — ABNORMAL HIGH (ref 0.44–1.00)
GFR calc Af Amer: 46 mL/min — ABNORMAL LOW (ref >60)
GFR calc non Af Amer: 39 mL/min — ABNORMAL LOW (ref >60)
Glucose, Bld: 203 mg/dL — ABNORMAL HIGH (ref 70–99)
Potassium: 4.5 mmol/L (ref 3.5–5.1)
Sodium: 136 mmol/L (ref 135–145)
Total Bilirubin: 0.5 mg/dL (ref 0.3–1.2)
Total Protein: 7.1 g/dL (ref 6.5–8.1)

## 2019-07-13 LAB — LIPASE, BLOOD: Lipase: 48 U/L (ref 11–51)

## 2019-07-13 LAB — CBG MONITORING, ED: Glucose-Capillary: 206 mg/dL — ABNORMAL HIGH (ref 70–99)

## 2019-07-13 LAB — TROPONIN I (HIGH SENSITIVITY): Troponin I (High Sensitivity): 512 ng/L (ref <18)

## 2019-07-13 MED ORDER — ASPIRIN 81 MG PO CHEW
324.0000 mg | CHEWABLE_TABLET | Freq: Once | ORAL | Status: AC
  Administered 2019-07-13: 324 mg via ORAL
  Filled 2019-07-13: qty 4

## 2019-07-13 MED ORDER — ASPIRIN 81 MG PO CHEW
81.0000 mg | CHEWABLE_TABLET | Freq: Every day | ORAL | 11 refills | Status: DC
Start: 2019-07-13 — End: 2019-09-26

## 2019-07-13 MED ORDER — ATORVASTATIN CALCIUM 20 MG PO TABS
20.0000 mg | ORAL_TABLET | Freq: Every day | ORAL | 2 refills | Status: DC
Start: 2019-07-13 — End: 2019-09-26

## 2019-07-13 NOTE — ED Provider Notes (Signed)
[facility] EMERGENCY DEPARTMENT Provider Note   CSN: [id number] Arrival date & time: 07/13/19  1032     History Chief Complaint  Patient presents with  . Abnormal Lab    Regina Good is a 86 y.o. female with a history of diabetes, GERD, hyperlipidemia, hypertension and CAD with an MI in 1999 with stent placement at [facility] presenting for evaluation of an elevated troponin level.  She was seen by her PCP yesterday with complaints of indigestion-like symptoms, describing frequent burping and burning pain in her epigastric region, but also lower abdominal discomfort with constipation problems.  She had basic lab work drawn there including a troponin level.  She received a call this morning stating her troponin was elevated at 0.53 and was advised to come here.  She denies any symptoms today, in fact she states she feels better than she had in the past several weeks.  Specifically she denies chest pain, shortness of breath, also no abdominal pain or discomfort.  She ate breakfast and lunch today prior to arriving here.  She was prescribed a GI cocktail type medication at yesterday's visit but has not picked up this medication yet.    HPI  HPI: A 86 year old patient with a history of treated diabetes, hypertension and hypercholesterolemia presents for evaluation of chest pain. Initial onset of pain was more than 6 hours ago. The patient's chest pain is not worse with exertion. The patient's chest pain is not middle- or left-sided, is not well-localized, is not described as heaviness/pressure/tightness, is not sharp and does not radiate to the arms/jaw/neck. The patient does not complain of nausea and denies diaphoresis. The patient has no history of stroke, has no history of peripheral artery disease, has not smoked in the past 90 days, has no relevant family history of coronary artery disease (first degree relative at less than age 47) and does not have an elevated BMI (>=30).   Past Medical History:    Diagnosis Date  . Anxiety   . Diabetes mellitus   . Family history of diabetes mellitus   . GERD (gastroesophageal reflux disease)   . History of melanoma   . Hyperlipidemia   . Hypertension     Patient Active Problem List   Diagnosis Date Noted  . ACUTE CYSTITIS 07/27/2008  . ALLERGIC RHINITIS 05/22/2008  . CHRONIC KIDNEY DISEASE STAGE III (MODERATE) 02/18/2008  . DIABETES MELLITUS, TYPE II, CONTROLLED 02/04/2008  . HYPERLIPIDEMIA 02/04/2008  . ANXIETY 02/04/2008  . HYPERTENSION 02/04/2008  . MYOCARDIAL INFARCTION, HX OF 02/04/2008  . CAD 02/04/2008  . GERD 02/04/2008  . CAROTID BRUIT, RIGHT 02/04/2008  . MELANOMA, HX OF 02/04/2008    Past Surgical History:  Procedure Laterality Date  . APPENDECTOMY    . CORONARY STENT PLACEMENT  2004  . NM MYOVIEW LTD  2010  . RENAL ARTERY STENT  2006     OB History    Gravida  3   Para  3   Term  3   Preterm      AB      Living  3     SAB      TAB      Ectopic      Multiple      Live Births              Family History  Problem Relation Age of Onset  . Pancreatic cancer Mother   . Coronary artery disease Father   . Diabetes Brother   .  Colon cancer Sister   . Stroke Sister   . Stroke Sister   . Diabetes Son   . Stroke Son   . Schizophrenia Daughter     Social History   Tobacco Use  . Smoking status: Never Smoker  . Smokeless tobacco: Never Used  Substance Use Topics  . Alcohol use: No  . Drug use: No    Home Medications Prior to Admission medications   Medication Sig Start Date End Date Taking? Authorizing Provider  omeprazole (PRILOSEC) 20 MG capsule Take 20 mg by mouth daily. 07/12/19  Yes [provider]  aspirin 81 MG chewable tablet Chew 1 tablet (81 mg total) by mouth daily. 07/13/19   Evalee Jefferson, PA-C  atorvastatin (LIPITOR) 20 MG tablet Take 1 tablet (20 mg total) by mouth daily. 07/13/19   Evalee Jefferson, PA-C  sucralfate (CARAFATE) 1 g tablet Take 1 tablet (1 g total) by  mouth 4 (four) times daily as needed. Patient not taking: Reported on 07/13/2019 06/20/19   Maudie Flakes, MD    Allergies    Codeine and Sulfonamide derivatives  Review of Systems   Review of Systems  Constitutional: Negative for fever.  HENT: Negative for congestion and sore throat.   Eyes: Negative.   Respiratory: Negative for chest tightness and shortness of breath.   Cardiovascular: Negative for chest pain, palpitations and leg swelling.  Gastrointestinal: Positive for constipation. Negative for abdominal distention, nausea and vomiting.       Increased eructation  Genitourinary: Negative.   Musculoskeletal: Negative for arthralgias, joint swelling and neck pain.  Skin: Negative.  Negative for rash and wound.  Neurological: Negative for dizziness, weakness, light-headedness, numbness and headaches.  Psychiatric/Behavioral: Negative.     Physical Exam Updated Vital Signs BP (!) 161/95   Pulse 73   Temp 98.3 F (36.8 C) (Oral)   Resp 17   Ht 5\' 5"  (1.651 m)   Wt 59 kg   SpO2 99%   BMI 21.63 kg/m   Physical Exam Vitals and nursing note reviewed.  Constitutional:      Appearance: She is well-developed.  HENT:     Head: Normocephalic and atraumatic.  Eyes:     Conjunctiva/sclera: Conjunctivae normal.  Cardiovascular:     Rate and Rhythm: Normal rate. Rhythm irregular. Frequent extrasystoles are present.    Heart sounds: Normal heart sounds.     Comments: Frequent pvc's without pattern, occurring every 4-12 beats on average.  Pulmonary:     Effort: Pulmonary effort is normal.     Breath sounds: Normal breath sounds. No wheezing.  Abdominal:     General: Bowel sounds are normal. There is no distension.     Palpations: Abdomen is soft. There is no mass.     Tenderness: There is no abdominal tenderness. There is no guarding.  Musculoskeletal:        General: Normal range of motion.     Cervical back: Normal range of motion.     Right lower leg: No edema.     Left  lower leg: No edema.  Skin:    General: Skin is warm and dry.  Neurological:     Mental Status: She is alert.     ED Results / Procedures / Treatments   Labs (all labs ordered are listed, but only abnormal results are displayed) Labs Reviewed  CBC - Abnormal; Notable for the following components:      Result Value   Hemoglobin 11.8 (*)    All  other components within normal limits  COMPREHENSIVE METABOLIC PANEL - Abnormal; Notable for the following components:   Chloride 97 (*)    Glucose, Bld 203 (*)    Creatinine, Ser 1.16 (*)    GFR calc non Af Amer 39 (*)    GFR calc Af Amer 46 (*)    All other components within normal limits  CBG MONITORING, ED - Abnormal; Notable for the following components:   Glucose-Capillary 206 (*)    All other components within normal limits  TROPONIN I (HIGH SENSITIVITY) - Abnormal; Notable for the following components:   Troponin I (High Sensitivity) 512 (*)    All other components within normal limits  LIPASE, BLOOD    EKG EKG Interpretation  Date/Time:  Wednesday July 13 2019 11:15:03 EDT Ventricular Rate:  95 PR Interval:    QRS Duration: 90 QT Interval:  382 QTC Calculation: 481 R Axis:   -29 Text Interpretation: Sinus tachycardia Multiple premature complexes, vent & supraven Abnormal R-wave progression, late transition Left ventricular hypertrophy Confirmed by Elnora Morrison [phone]) on 07/13/2019 1:24:00 PM   Radiology DG Chest Portable 1 View  Result Date: 07/13/2019 CLINICAL DATA:  Elevated serum troponin EXAM: PORTABLE CHEST 1 VIEW COMPARISON:  06/20/2019 FINDINGS: Stable cardiomediastinal contours. Densely calcified thoracic aorta. No focal airspace consolidation, pleural effusion, or pneumothorax. IMPRESSION: 1. No acute cardiopulmonary abnormality. 2. Aortic atherosclerosis. Electronically Signed   By: Davina Poke D.O.   On: 07/13/2019 12:05    Procedures Procedures (including critical care time)  Medications Ordered  in ED Medications  aspirin chewable tablet 324 mg (324 mg Oral Given 07/13/19 1210)    ED Course  I have reviewed the triage vital signs and the nursing notes.  Pertinent labs & imaging results that were available during my care of the patient were reviewed by me and considered in my medical decision making (see chart for details).    MDM Rules/Calculators/A&P HEAR Score: 5                    Some time during ed visit, pt admitted to RN that she had some midsternal chest pain one day last which which has resolved. She has a significantly elevated troponin level today, ekg without ischemic changes. Probable subacute MI, discussed results and recommendation for admission and further cardiac evaluation.  Pt declines this, stating at her age she does not want any further interventions and she wants to die at home, stating she has outlived her siblings and husband and is welcoming her death.  She understands the significance of todays findings and son at bedside also is aware of todays findings and respects mothers wishes.  Discussed with Dr. Harl Bowie who recommended home ASA 81 daily and lipitor 20 mg QD which was prescribed.  Also given referral to Dr Harl Bowie should she change her mind re further eval.  She is A&O x 3 and has capacity to decide her medical care.    Final Clinical Impression(s) / ED Diagnoses Final diagnoses:  Elevated troponin  Myocardial infarction, unspecified MI type, unspecified artery ([place])    Rx / DC Orders ED Discharge Orders         Ordered    aspirin 81 MG chewable tablet  Daily     07/13/19 1432    atorvastatin (LIPITOR) 20 MG tablet  Daily     07/13/19 1432           Evalee Jefferson, PA-C 07/13/19 1446    Elnora Morrison,  MD 07/13/19 1621

## 2019-07-13 NOTE — ED Notes (Signed)
CRITICAL VALUE ALERT  Critical Value:  Trop 512  Date & Time Notied:  07/13/19, 1304  Provider Notified: J. Idol, PA  Orders Received/Actions taken: no new orders

## 2019-07-13 NOTE — Discharge Instructions (Addendum)
As discussed, your lab tests today suggest you may have had a heart attack in the recent past, although you are asymptomatic today.  It is recommended that you be admitted to the hospital for further evaluation.  However we understand your reluctance for further testing.  We spoke with Dr. Harl Bowie from cardiology who recommends you take a baby aspirin daily and also to start taking a low dose of Lipitor to help protect you from worsening symptoms.  Return here for any acute changes.  You may also call Dr. Harl Bowie for further evaluation in his office if you decide you want further tests.

## 2019-07-13 NOTE — ED Triage Notes (Signed)
Told by Dr Nevada Crane to come to [facility] for elevated troponin.  Denies any pain

## 2019-09-24 ENCOUNTER — Inpatient Hospital Stay (HOSPITAL_COMMUNITY)
Admission: EM | Admit: 2019-09-24 | Discharge: 2019-09-26 | DRG: 291 | Disposition: A | Discharge status: Home / Self Care | Payer: Medicare Other | Attending: Family Medicine | Admitting: Family Medicine

## 2019-09-24 ENCOUNTER — Encounter (HOSPITAL_COMMUNITY): Payer: Self-pay | Admitting: *Deleted

## 2019-09-24 ENCOUNTER — Emergency Department (HOSPITAL_COMMUNITY): Payer: Medicare Other

## 2019-09-24 ENCOUNTER — Other Ambulatory Visit: Payer: Self-pay

## 2019-09-24 DIAGNOSIS — N1831 Chronic kidney disease, stage 3a: Secondary | ICD-10-CM | POA: Diagnosis present

## 2019-09-24 DIAGNOSIS — I429 Cardiomyopathy, unspecified: Secondary | ICD-10-CM | POA: Diagnosis present

## 2019-09-24 DIAGNOSIS — E1122 Type 2 diabetes mellitus with diabetic chronic kidney disease: Secondary | ICD-10-CM | POA: Diagnosis not present

## 2019-09-24 DIAGNOSIS — Z818 Family history of other mental and behavioral disorders: Secondary | ICD-10-CM

## 2019-09-24 DIAGNOSIS — I5023 Acute on chronic systolic (congestive) heart failure: Secondary | ICD-10-CM | POA: Diagnosis present

## 2019-09-24 DIAGNOSIS — R42 Dizziness and giddiness: Secondary | ICD-10-CM | POA: Diagnosis not present

## 2019-09-24 DIAGNOSIS — I130 Hypertensive heart and chronic kidney disease with heart failure and stage 1 through stage 4 chronic kidney disease, or unspecified chronic kidney disease: Secondary | ICD-10-CM | POA: Diagnosis not present

## 2019-09-24 DIAGNOSIS — N179 Acute kidney failure, unspecified: Secondary | ICD-10-CM | POA: Diagnosis present

## 2019-09-24 DIAGNOSIS — Z79899 Other long term (current) drug therapy: Secondary | ICD-10-CM

## 2019-09-24 DIAGNOSIS — K219 Gastro-esophageal reflux disease without esophagitis: Secondary | ICD-10-CM | POA: Diagnosis present

## 2019-09-24 DIAGNOSIS — I252 Old myocardial infarction: Secondary | ICD-10-CM

## 2019-09-24 DIAGNOSIS — Z833 Family history of diabetes mellitus: Secondary | ICD-10-CM

## 2019-09-24 DIAGNOSIS — Z955 Presence of coronary angioplasty implant and graft: Secondary | ICD-10-CM

## 2019-09-24 DIAGNOSIS — Z85820 Personal history of malignant melanoma of skin: Secondary | ICD-10-CM

## 2019-09-24 DIAGNOSIS — I459 Conduction disorder, unspecified: Secondary | ICD-10-CM | POA: Diagnosis present

## 2019-09-24 DIAGNOSIS — E785 Hyperlipidemia, unspecified: Secondary | ICD-10-CM | POA: Diagnosis present

## 2019-09-24 DIAGNOSIS — I083 Combined rheumatic disorders of mitral, aortic and tricuspid valves: Secondary | ICD-10-CM | POA: Diagnosis present

## 2019-09-24 DIAGNOSIS — Z8249 Family history of ischemic heart disease and other diseases of the circulatory system: Secondary | ICD-10-CM

## 2019-09-24 DIAGNOSIS — Z7982 Long term (current) use of aspirin: Secondary | ICD-10-CM

## 2019-09-24 DIAGNOSIS — J309 Allergic rhinitis, unspecified: Secondary | ICD-10-CM | POA: Diagnosis present

## 2019-09-24 DIAGNOSIS — R110 Nausea: Secondary | ICD-10-CM | POA: Diagnosis present

## 2019-09-24 DIAGNOSIS — R531 Weakness: Secondary | ICD-10-CM

## 2019-09-24 DIAGNOSIS — I2510 Atherosclerotic heart disease of native coronary artery without angina pectoris: Secondary | ICD-10-CM | POA: Diagnosis present

## 2019-09-24 DIAGNOSIS — Z66 Do not resuscitate: Secondary | ICD-10-CM | POA: Diagnosis present

## 2019-09-24 DIAGNOSIS — N183 Chronic kidney disease, stage 3 unspecified: Secondary | ICD-10-CM

## 2019-09-24 DIAGNOSIS — F411 Generalized anxiety disorder: Secondary | ICD-10-CM | POA: Diagnosis not present

## 2019-09-24 DIAGNOSIS — Z800 Family history of malignant neoplasm of digestive organs: Secondary | ICD-10-CM

## 2019-09-24 DIAGNOSIS — Z823 Family history of stroke: Secondary | ICD-10-CM

## 2019-09-24 DIAGNOSIS — Z20822 Contact with and (suspected) exposure to covid-19: Secondary | ICD-10-CM | POA: Diagnosis present

## 2019-09-24 LAB — URINALYSIS, ROUTINE W REFLEX MICROSCOPIC
Bilirubin Urine: NEGATIVE
Glucose, UA: NEGATIVE mg/dL
Hgb urine dipstick: NEGATIVE
Ketones, ur: 5 mg/dL — AB
Leukocytes,Ua: NEGATIVE
Nitrite: NEGATIVE
Protein, ur: NEGATIVE mg/dL
Specific Gravity, Urine: 1.017 (ref 1.005–1.030)
pH: 5 (ref 5.0–8.0)

## 2019-09-24 LAB — COMPREHENSIVE METABOLIC PANEL
ALT: 15 U/L (ref 0–44)
AST: 20 U/L (ref 15–41)
Albumin: 3.8 g/dL (ref 3.5–5.0)
Alkaline Phosphatase: 57 U/L (ref 38–126)
Anion gap: 11 (ref 5–15)
BUN: 16 mg/dL (ref 8–23)
CO2: 26 mmol/L (ref 22–32)
Calcium: 8.7 mg/dL — ABNORMAL LOW (ref 8.9–10.3)
Chloride: 98 mmol/L (ref 98–111)
Creatinine, Ser: 1.18 mg/dL — ABNORMAL HIGH (ref 0.44–1.00)
GFR calc Af Amer: 45 mL/min — ABNORMAL LOW (ref >60)
GFR calc non Af Amer: 39 mL/min — ABNORMAL LOW (ref >60)
Glucose, Bld: 141 mg/dL — ABNORMAL HIGH (ref 70–99)
Potassium: 4.4 mmol/L (ref 3.5–5.1)
Sodium: 135 mmol/L (ref 135–145)
Total Bilirubin: 0.8 mg/dL (ref 0.3–1.2)
Total Protein: 7.6 g/dL (ref 6.5–8.1)

## 2019-09-24 LAB — CBC WITH DIFFERENTIAL/PLATELET
Abs Immature Granulocytes: 0.02 10*3/uL (ref 0.00–0.07)
Basophils Absolute: 0 10*3/uL (ref 0.0–0.1)
Basophils Relative: 0 %
Eosinophils Absolute: 0.2 10*3/uL (ref 0.0–0.5)
Eosinophils Relative: 2 %
HCT: 39.5 % (ref 36.0–46.0)
Hemoglobin: 12 g/dL (ref 12.0–15.0)
Immature Granulocytes: 0 %
Lymphocytes Relative: 19 %
Lymphs Abs: 1.4 10*3/uL (ref 0.7–4.0)
MCH: 26.1 pg (ref 26.0–34.0)
MCHC: 30.4 g/dL (ref 30.0–36.0)
MCV: 85.9 fL (ref 80.0–100.0)
Monocytes Absolute: 0.4 10*3/uL (ref 0.1–1.0)
Monocytes Relative: 6 %
Neutro Abs: 5.4 10*3/uL (ref 1.7–7.7)
Neutrophils Relative %: 73 %
Platelets: 297 10*3/uL (ref 150–400)
RBC: 4.6 MIL/uL (ref 3.87–5.11)
RDW: 14.5 % (ref 11.5–15.5)
WBC: 7.4 10*3/uL (ref 4.0–10.5)
nRBC: 0 % (ref 0.0–0.2)

## 2019-09-24 LAB — TROPONIN I (HIGH SENSITIVITY)
Troponin I (High Sensitivity): 28 ng/L — ABNORMAL HIGH (ref <18)
Troponin I (High Sensitivity): 27 ng/L — ABNORMAL HIGH (ref <18)
Troponin I (High Sensitivity): 30 ng/L — ABNORMAL HIGH (ref <18)
Troponin I (High Sensitivity): 29 ng/L — ABNORMAL HIGH (ref <18)

## 2019-09-24 LAB — LIPASE, BLOOD: Lipase: 45 U/L (ref 11–51)

## 2019-09-24 LAB — PROTIME-INR
INR: 1 (ref 0.8–1.2)
Prothrombin Time: 12.5 seconds (ref 11.4–15.2)

## 2019-09-24 LAB — BRAIN NATRIURETIC PEPTIDE: B Natriuretic Peptide: 1614 pg/mL — ABNORMAL HIGH (ref 0.0–100.0)

## 2019-09-24 LAB — SARS CORONAVIRUS 2 BY RT PCR (HOSPITAL ORDER, PERFORMED IN ~~LOC~~ HOSPITAL LAB): SARS Coronavirus 2: NEGATIVE

## 2019-09-24 MED ORDER — ONDANSETRON HCL 4 MG PO TABS
4.0000 mg | ORAL_TABLET | Freq: Four times a day (QID) | ORAL | Status: DC | PRN
  Administered 2019-09-25: 4 mg via ORAL
  Filled 2019-09-24: qty 1

## 2019-09-24 MED ORDER — METOCLOPRAMIDE HCL 5 MG/ML IJ SOLN
10.0000 mg | Freq: Once | INTRAMUSCULAR | Status: AC
  Administered 2019-09-24: 10 mg via INTRAVENOUS
  Filled 2019-09-24: qty 2

## 2019-09-24 MED ORDER — ATORVASTATIN CALCIUM 20 MG PO TABS
20.0000 mg | ORAL_TABLET | Freq: Every day | ORAL | Status: DC
  Administered 2019-09-24 – 2019-09-25 (×2): 20 mg via ORAL
  Filled 2019-09-24 (×2): qty 1

## 2019-09-24 MED ORDER — PANTOPRAZOLE SODIUM 40 MG PO TBEC
40.0000 mg | DELAYED_RELEASE_TABLET | Freq: Every day | ORAL | Status: DC
  Administered 2019-09-25 – 2019-09-26 (×2): 40 mg via ORAL
  Filled 2019-09-24 (×2): qty 1

## 2019-09-24 MED ORDER — ALPRAZOLAM 0.25 MG PO TABS
0.2500 mg | ORAL_TABLET | Freq: Every day | ORAL | Status: DC
  Administered 2019-09-24 – 2019-09-25 (×2): 0.25 mg via ORAL
  Filled 2019-09-24 (×2): qty 1

## 2019-09-24 MED ORDER — FENTANYL CITRATE (PF) 100 MCG/2ML IJ SOLN
50.0000 ug | Freq: Once | INTRAMUSCULAR | Status: AC
  Administered 2019-09-24: 50 ug via INTRAVENOUS
  Filled 2019-09-24: qty 2

## 2019-09-24 MED ORDER — ENOXAPARIN SODIUM 40 MG/0.4ML ~~LOC~~ SOLN: 40.0000 mg | SUBCUTANEOUS | Status: DC

## 2019-09-24 MED ORDER — ASPIRIN 81 MG PO CHEW
324.0000 mg | CHEWABLE_TABLET | Freq: Once | ORAL | Status: AC
  Administered 2019-09-24: 324 mg via ORAL
  Filled 2019-09-24: qty 4

## 2019-09-24 MED ORDER — ENOXAPARIN SODIUM 30 MG/0.3ML ~~LOC~~ SOLN
30.0000 mg | SUBCUTANEOUS | Status: DC
  Administered 2019-09-24 – 2019-09-25 (×2): 30 mg via SUBCUTANEOUS
  Filled 2019-09-24 (×2): qty 0.3

## 2019-09-24 MED ORDER — SODIUM CHLORIDE 0.9 % IV SOLN: INTRAVENOUS | Status: DC

## 2019-09-24 MED ORDER — ACETAMINOPHEN 325 MG PO TABS: 650.0000 mg | ORAL_TABLET | Freq: Four times a day (QID) | ORAL | Status: DC | PRN

## 2019-09-24 MED ORDER — ACETAMINOPHEN 650 MG RE SUPP: 650.0000 mg | Freq: Four times a day (QID) | RECTAL | Status: DC | PRN

## 2019-09-24 MED ORDER — ASPIRIN 81 MG PO CHEW
81.0000 mg | CHEWABLE_TABLET | Freq: Every day | ORAL | Status: DC
  Administered 2019-09-25 – 2019-09-26 (×2): 81 mg via ORAL
  Filled 2019-09-24 (×2): qty 1

## 2019-09-24 MED ORDER — ONDANSETRON HCL 4 MG/2ML IJ SOLN: 4.0000 mg | Freq: Four times a day (QID) | INTRAMUSCULAR | Status: DC | PRN

## 2019-09-24 NOTE — H&P (Signed)
History and Physical    Regina Good:[id number] DOB: 1920-01-24 DOA: 09/24/2019  PCP: Celene Squibb, MD  Patient coming from: home  I have personally briefly reviewed patient's old medical records in [place]  Chief Complaint: generalized weakness, nausea, headache  HPI: Regina Good is a 86 y.o. female with medical history significant of hyperlipidemia, diet-controlled diabetes, coronary artery disease with MI in the past, had come to the emergency room on 07/13/2019 where she was found to have elevated troponins in the 500 range.  She did not want to be admitted for further work-up and decided to discharge home.  She reports that since that time she has felt unwell.  More recently, she reports over the past 5 days she has had intermittent nausea and decreased p.o. intake.  She is still dizzy on standing.  She has felt generally weak.  She is not had any chest pain or shortness of breath.  She also had a left-sided frontal headache which is also been intermittent.  No vision changes.  No vomiting or diarrhea.  ED Course: Vitals noted to be stable.  Basic labs unrevealing.  Troponin in the 20s range x2.  EKG showed changes of ST depression in inferior leads with T wave inversions.  Case was reviewed with cardiologist on-call who did not feel the patient met criteria for STEMI.  She has been referred for admission.  Review of Systems: As per HPI otherwise 10 point review of systems negative.    Past Medical History:  Diagnosis Date  . Anxiety   . Diabetes mellitus   . Family history of diabetes mellitus   . GERD (gastroesophageal reflux disease)   . History of melanoma   . Hyperlipidemia   . Hypertension     Past Surgical History:  Procedure Laterality Date  . APPENDECTOMY    . CORONARY STENT PLACEMENT  2004  . NM MYOVIEW LTD  2010  . RENAL ARTERY STENT  2006    Social History:  reports that she has never smoked. She has never used smokeless tobacco. She reports that  she does not drink alcohol and does not use drugs.  Allergies  Allergen Reactions  . Codeine Other (See Comments)    Sensation of being out of this world.  . Sulfonamide Derivatives Other (See Comments)    Makes her feel like dreaming while awake.    Family History  Problem Relation Age of Onset  . Pancreatic cancer Mother   . Coronary artery disease Father   . Diabetes Brother   . Colon cancer Sister   . Stroke Sister   . Stroke Sister   . Diabetes Son   . Stroke Son   . Schizophrenia Daughter     Prior to Admission medications   Medication Sig Start Date End Date Taking? Authorizing Provider  ALPRAZolam Duanne Moron) 0.25 MG tablet Take 1-1.5 tablets by mouth daily. Takes 1 tab HS to help with sleep. If having bad day, takes 0.5 tab during the day if needed. 09/19/19  Yes [provider]  aspirin 81 MG chewable tablet Chew 1 tablet (81 mg total) by mouth daily. 07/13/19  Yes Idol, Almyra Free, PA-C  furosemide (LASIX) 20 MG tablet Take 10 mg by mouth daily. 08/12/19  Yes [provider]  atorvastatin (LIPITOR) 20 MG tablet Take 1 tablet (20 mg total) by mouth daily. 07/13/19   Evalee Jefferson, PA-C  omeprazole (PRILOSEC) 20 MG capsule Take 20 mg by mouth daily. 07/12/19  [provider]  sucralfate (CARAFATE) 1 g tablet Take 1 tablet (1 g total) by mouth 4 (four) times daily as needed. Patient not taking: Reported on 07/13/2019 06/20/19   Maudie Flakes, MD    Physical Exam: Vitals:   09/24/19 1330 09/24/19 1344 09/24/19 1359 09/24/19 1414  BP: 121/86  (!) 144/80   Pulse:  89 93 100  Resp: (!) 23 17 17 19   Temp:      TempSrc:      SpO2:  100% 95% (!) 88%  Weight:      Height:        Constitutional: NAD, calm, comfortable Eyes: PERRL, lids and conjunctivae normal ENMT: Mucous membranes are moist. Posterior pharynx clear of any exudate or lesions.Normal dentition.  Neck: normal, supple, no masses, no thyromegaly Respiratory: clear to auscultation bilaterally, no  wheezing, no crackles. Normal respiratory effort. No accessory muscle use.  Cardiovascular: Regular rate and rhythm, no murmurs / rubs / gallops. No extremity edema. 2+ pedal pulses. No carotid bruits.  Abdomen: no tenderness, no masses palpated. No hepatosplenomegaly. Bowel sounds positive.  Musculoskeletal: no clubbing / cyanosis. No joint deformity upper and lower extremities. Good ROM, no contractures. Normal muscle tone.  Skin: no rashes, lesions, ulcers. No induration Neurologic: CN 2-12 grossly intact. Sensation intact, DTR normal. Strength 5/5 in all 4.  Psychiatric: Normal judgment and insight. Alert and oriented x 3. Normal mood.    Labs on Admission: I have personally reviewed following labs and imaging studies  CBC: Recent Labs  Lab 09/24/19 1104  WBC 7.4  NEUTROABS 5.4  HGB 12.0  HCT 39.5  MCV 85.9  PLT 901   Basic Metabolic Panel: Recent Labs  Lab 09/24/19 1104  NA 135  K 4.4  CL 98  CO2 26  GLUCOSE 141*  BUN 16  CREATININE 1.18*  CALCIUM 8.7*   GFR: Estimated Creatinine Clearance: 24 mL/min (A) (by C-G formula based on SCr of 1.18 mg/dL (H)). Liver Function Tests: Recent Labs  Lab 09/24/19 1104  AST 20  ALT 15  ALKPHOS 57  BILITOT 0.8  PROT 7.6  ALBUMIN 3.8   Recent Labs  Lab 09/24/19 1104  LIPASE 45   No results for input(s): AMMONIA in the last 168 hours. Coagulation Profile: Recent Labs  Lab 09/24/19 1104  INR 1.0   Cardiac Enzymes: No results for input(s): CKTOTAL, CKMB, CKMBINDEX, TROPONINI in the last 168 hours. BNP (last 3 results) No results for input(s): PROBNP in the last 8760 hours. HbA1C: No results for input(s): HGBA1C in the last 72 hours. CBG: No results for input(s): GLUCAP in the last 168 hours. Lipid Profile: No results for input(s): CHOL, HDL, LDLCALC, TRIG, CHOLHDL, LDLDIRECT in the last 72 hours. Thyroid Function Tests: No results for input(s): TSH, T4TOTAL, FREET4, T3FREE, THYROIDAB in the last 72  hours. Anemia Panel: No results for input(s): VITAMINB12, FOLATE, FERRITIN, TIBC, IRON, RETICCTPCT in the last 72 hours. Urine analysis:    Component Value Date/Time   COLORURINE YELLOW 09/24/2019 1454   APPEARANCEUR CLEAR 09/24/2019 1454   LABSPEC 1.017 09/24/2019 1454   PHURINE 5.0 09/24/2019 1454   GLUCOSEU NEGATIVE 09/24/2019 1454   HGBUR NEGATIVE 09/24/2019 1454   HGBUR trace-intact 07/27/2008 1336   BILIRUBINUR NEGATIVE 09/24/2019 1454   KETONESUR 5 (A) 09/24/2019 1454   PROTEINUR NEGATIVE 09/24/2019 1454   UROBILINOGEN 0.2 01/19/2014 0936   NITRITE NEGATIVE 09/24/2019 1454   LEUKOCYTESUR NEGATIVE 09/24/2019 1454    Radiological Exams on Admission: CT Head  Wo Contrast  Result Date: 09/24/2019 CLINICAL DATA:  Nausea, bilateral feet swelling for 5 days, headache for 1 day. EXAM: CT HEAD WITHOUT CONTRAST TECHNIQUE: Contiguous axial images were obtained from the base of the skull through the vertex without intravenous contrast. COMPARISON:  None. FINDINGS: Brain: Generalized age related parenchymal volume loss with commensurate dilatation of the ventricles and sulci. Chronic small vessel ischemic changes within the bilateral periventricular and subcortical white matter regions. No mass, hemorrhage, edema or other evidence of acute parenchymal abnormality. No extra-axial hemorrhage. Vascular: Chronic calcified atherosclerotic changes of the large vessels at the skull base. No unexpected hyperdense vessel. Skull: Normal. Negative for fracture or focal lesion. Sinuses/Orbits: No acute finding. Other: None. IMPRESSION: 1. No acute findings. No intracranial mass, hemorrhage or edema. 2. Chronic small vessel ischemic changes in the white matter. Electronically Signed   By: Franki Cabot M.D.   On: 09/24/2019 11:52   DG Chest Port 1 View  Result Date: 09/24/2019 CLINICAL DATA:  Nausea, lower extremity swelling for 5 days, headache for 1 day. History of diabetes and hypertension. EXAM: PORTABLE  CHEST 1 VIEW COMPARISON:  Chest x-rays dated 07/13/2019 and 06/20/2019. FINDINGS: Stable cardiomegaly. Chronic scarring/atelectasis at the lung bases. Lungs otherwise clear. No confluent opacity to suggest a developing pneumonia. No pleural effusion or pneumothorax is seen. Osseous structures about the chest are unremarkable. IMPRESSION: 1. No active disease. No evidence of pneumonia or pulmonary edema. 2. Stable cardiomegaly. Electronically Signed   By: Franki Cabot M.D.   On: 09/24/2019 11:53    EKG: Independently reviewed. Some T inversions and ST depressions in II and AVL  Assessment/Plan Active Problems:   Type 2 diabetes mellitus with stage 3 chronic kidney disease (HCC)   HLD (hyperlipidemia)   Anxiety state   MYOCARDIAL INFARCTION, HX OF   CAD (coronary artery disease)   CHRONIC KIDNEY DISEASE STAGE III (MODERATE)   Nausea   Dizziness     1. Dizziness.  Possibly related to volume depletion from poor p.o. intake.  Check orthostatics.  No neurologic deficits.  Provide gentle hydration. 2. Nausea.  Question related to GERD.  Continue PPI.  Provide antiemetics. 3. Coronary artery disease.  She does not have any chest pain.  Question if nausea is playing a role.  She does not have any other suspicious symptoms.  Continue cycle troponins.  Check echocardiogram. 4. Anxiety.  Continue benzodiazepines 5. Hyperlipidemia.  Continue statin 6. Type 2 diabetes.  She is clinically managed with diet control. 7. Chronic kidney disease stage III.  Secondary to diabetes.  Creatinine is currently at baseline.  Continue to monitor.  DVT prophylaxis: lovenox Code Status: DNR  Family Communication: discussed with son at the bedside  Disposition Plan: discharge home in AM if work up is negative  Consults called:   Admission status: observation, tele   Kathie Dike MD [facility]   If 7PM-7AM, please contact night-coverage [email or web address]   09/24/2019, 4:15 PM

## 2019-09-24 NOTE — ED Triage Notes (Signed)
Patient presents to the ED with nausea without vomiting, weakness, bilateral feet swelling for 5 days.  Patient reports a headache for one day.

## 2019-09-24 NOTE — Progress Notes (Signed)
[facility] STEMI Evaluation Note  Date: 09/24/19  Time: 11:19 AM  I was contacted by Dr. Melina Copa to review EKG's for Regina Good.  She presented with weakness, nausea, and headache.  She denies chest pain.  She was seen in the [facility] ED twice in April, with 4/28 visit notable for elevated troponin.  The patient refused further workup at that time.  EKG's today show sinus tachycardia with right axis deviation, IVCD, and inferior ST depression and T wave inversions as well as ST elevation in aVL and V2.  Changes are new from 07/13/19, though LBBB was noted on the tracing from 06/20/19.  I have recommended repeating and EKG with close attention to lead placement, as changes in limb leads could be due to lead reversal.  EKG does not meet STEMI criteria at this time.  Additionally, given vague symptoms without ongoing chest pain, advanced age, and prior refusal of further inpatient workup/management of chest pain and elevated troponin in April, I recommend further workup in the ED and possible transfer to [facility] for cardiology evaluation if there is evidence of ongoing myocardial ischemia and the patient is in agreement with transfer and additional testing.  Nelva Bush, MD [facility]

## 2019-09-24 NOTE — ED Provider Notes (Signed)
[facility] EMERGENCY DEPARTMENT Provider Note   CSN: [id number] Arrival date & time: 09/24/19  1031     History Chief Complaint  Patient presents with  . Nausea    Regina Good is a 86 y.o. female.  She has a history of diabetes hyperlipidemia hypertension.  She was here at the end of April for chest pain and had rising troponins.  She declined admission.  She said she has not felt well since then.  She is complaining of nausea and generalized weakness.  Yesterday she started with a severe headache that is somewhat improved today but still there.  Denies chest pain or shortness of breath.  The history is provided by the patient.  Weakness Severity:  Moderate Onset quality:  Gradual Timing:  Constant Progression:  Worsening Chronicity:  New Relieved by:  Nothing Worsened by:  Activity Ineffective treatments:  None tried Associated symptoms: difficulty walking, fever (subjective yesterday), headaches and nausea   Associated symptoms: no abdominal pain, no chest pain, no cough, no dysuria, no falls, no foul-smelling urine, no loss of consciousness, no melena, no shortness of breath and no vomiting        Past Medical History:  Diagnosis Date  . Anxiety   . Diabetes mellitus   . Family history of diabetes mellitus   . GERD (gastroesophageal reflux disease)   . History of melanoma   . Hyperlipidemia   . Hypertension     Patient Active Problem List   Diagnosis Date Noted  . ACUTE CYSTITIS 07/27/2008  . ALLERGIC RHINITIS 05/22/2008  . CHRONIC KIDNEY DISEASE STAGE III (MODERATE) 02/18/2008  . DIABETES MELLITUS, TYPE II, CONTROLLED 02/04/2008  . HYPERLIPIDEMIA 02/04/2008  . ANXIETY 02/04/2008  . HYPERTENSION 02/04/2008  . MYOCARDIAL INFARCTION, HX OF 02/04/2008  . CAD 02/04/2008  . GERD 02/04/2008  . CAROTID BRUIT, RIGHT 02/04/2008  . MELANOMA, HX OF 02/04/2008    Past Surgical History:  Procedure Laterality Date  . APPENDECTOMY    . CORONARY STENT PLACEMENT   2004  . NM MYOVIEW LTD  2010  . RENAL ARTERY STENT  2006     OB History    Gravida  3   Para  3   Term  3   Preterm      AB      Living  3     SAB      TAB      Ectopic      Multiple      Live Births              Family History  Problem Relation Age of Onset  . Pancreatic cancer Mother   . Coronary artery disease Father   . Diabetes Brother   . Colon cancer Sister   . Stroke Sister   . Stroke Sister   . Diabetes Son   . Stroke Son   . Schizophrenia Daughter     Social History   Tobacco Use  . Smoking status: Never Smoker  . Smokeless tobacco: Never Used  Vaping Use  . Vaping Use: Never used  Substance Use Topics  . Alcohol use: No  . Drug use: No    Home Medications Prior to Admission medications   Medication Sig Start Date End Date Taking? Authorizing Provider  aspirin 81 MG chewable tablet Chew 1 tablet (81 mg total) by mouth daily. 07/13/19   Evalee Jefferson, PA-C  atorvastatin (LIPITOR) 20 MG tablet Take 1 tablet (20 mg total) by mouth daily.  07/13/19   Evalee Jefferson, PA-C  omeprazole (PRILOSEC) 20 MG capsule Take 20 mg by mouth daily. 07/12/19   [provider]  sucralfate (CARAFATE) 1 g tablet Take 1 tablet (1 g total) by mouth 4 (four) times daily as needed. Patient not taking: Reported on 07/13/2019 06/20/19   Maudie Flakes, MD    Allergies    Codeine and Sulfonamide derivatives  Review of Systems   Review of Systems  Constitutional: Positive for fever (subjective yesterday).  HENT: Negative for sore throat.   Eyes: Negative for visual disturbance.  Respiratory: Negative for cough and shortness of breath.   Cardiovascular: Positive for leg swelling. Negative for chest pain.  Gastrointestinal: Positive for nausea. Negative for abdominal pain, melena and vomiting.  Genitourinary: Negative for dysuria.  Musculoskeletal: Negative for back pain and falls.  Skin: Negative for rash.  Neurological: Positive for weakness and headaches.  Negative for loss of consciousness and speech difficulty.    Physical Exam Updated Vital Signs BP (!) 135/103 (BP Location: Right Arm)   Pulse (!) 109   Temp 98.3 F (36.8 C) (Oral)   Resp 15   Ht 5' 4.5" (1.638 m)   Wt 58.1 kg   SpO2 96%   BMI 21.63 kg/m   Physical Exam Vitals and nursing note reviewed.  Constitutional:      General: She is not in acute distress.    Appearance: She is well-developed.  HENT:     Head: Normocephalic and atraumatic.  Eyes:     Conjunctiva/sclera: Conjunctivae normal.  Cardiovascular:     Rate and Rhythm: Tachycardia present. Rhythm irregular.     Pulses: Normal pulses.     Heart sounds: No murmur heard.   Pulmonary:     Effort: Pulmonary effort is normal. No respiratory distress.     Breath sounds: Normal breath sounds.  Abdominal:     Palpations: Abdomen is soft.     Tenderness: There is no abdominal tenderness.  Musculoskeletal:        General: No deformity or signs of injury. Normal range of motion.     Cervical back: Neck supple.  Skin:    General: Skin is warm and dry.     Capillary Refill: Capillary refill takes less than 2 seconds.  Neurological:     General: No focal deficit present.     Mental Status: She is alert.     ED Results / Procedures / Treatments   Labs (all labs ordered are listed, but only abnormal results are displayed) Labs Reviewed  COMPREHENSIVE METABOLIC PANEL - Abnormal; Notable for the following components:      Result Value   Glucose, Bld 141 (*)    Creatinine, Ser 1.18 (*)    Calcium 8.7 (*)    GFR calc non Af Amer 39 (*)    GFR calc Af Amer 45 (*)    All other components within normal limits  URINALYSIS, ROUTINE W REFLEX MICROSCOPIC - Abnormal; Notable for the following components:   Ketones, ur 5 (*)    All other components within normal limits  BRAIN NATRIURETIC PEPTIDE - Abnormal; Notable for the following components:   B Natriuretic Peptide 1,614.0 (*)    All other components within  normal limits  TROPONIN I (HIGH SENSITIVITY) - Abnormal; Notable for the following components:   Troponin I (High Sensitivity) 28 (*)    All other components within normal limits  TROPONIN I (HIGH SENSITIVITY) - Abnormal; Notable for the following components:  Troponin I (High Sensitivity) 27 (*)    All other components within normal limits  TROPONIN I (HIGH SENSITIVITY) - Abnormal; Notable for the following components:   Troponin I (High Sensitivity) 30 (*)    All other components within normal limits  SARS CORONAVIRUS 2 BY RT PCR (HOSPITAL ORDER, [facility])  CBC WITH DIFFERENTIAL/PLATELET  LIPASE, BLOOD  PROTIME-INR  BASIC METABOLIC PANEL  CBC  TROPONIN I (HIGH SENSITIVITY)    EKG EKG Interpretation  Date/Time:  Saturday September 24 2019 10:56:11 EDT Ventricular Rate:  111 PR Interval:    QRS Duration: 149 QT Interval:  397 QTC Calculation: 540 R Axis:   175 Text Interpretation: Right and left arm electrode reversal, interpretation assumes no reversal Sinus tachycardia Multiple ventricular premature complexes Lateral infarct, acute Probable anterior infarct, old Prolonged QT interval >>> Acute MI <<< Confirmed by Aletta Edouard [phone]) on 09/24/2019 10:58:39 AM   Radiology CT Head Wo Contrast  Result Date: 09/24/2019 CLINICAL DATA:  Nausea, bilateral feet swelling for 5 days, headache for 1 day. EXAM: CT HEAD WITHOUT CONTRAST TECHNIQUE: Contiguous axial images were obtained from the base of the skull through the vertex without intravenous contrast. COMPARISON:  None. FINDINGS: Brain: Generalized age related parenchymal volume loss with commensurate dilatation of the ventricles and sulci. Chronic small vessel ischemic changes within the bilateral periventricular and subcortical white matter regions. No mass, hemorrhage, edema or other evidence of acute parenchymal abnormality. No extra-axial hemorrhage. Vascular: Chronic calcified atherosclerotic changes  of the large vessels at the skull base. No unexpected hyperdense vessel. Skull: Normal. Negative for fracture or focal lesion. Sinuses/Orbits: No acute finding. Other: None. IMPRESSION: 1. No acute findings. No intracranial mass, hemorrhage or edema. 2. Chronic small vessel ischemic changes in the white matter. Electronically Signed   By: Franki Cabot M.D.   On: 09/24/2019 11:52   DG Chest Port 1 View  Result Date: 09/24/2019 CLINICAL DATA:  Nausea, lower extremity swelling for 5 days, headache for 1 day. History of diabetes and hypertension. EXAM: PORTABLE CHEST 1 VIEW COMPARISON:  Chest x-rays dated 07/13/2019 and 06/20/2019. FINDINGS: Stable cardiomegaly. Chronic scarring/atelectasis at the lung bases. Lungs otherwise clear. No confluent opacity to suggest a developing pneumonia. No pleural effusion or pneumothorax is seen. Osseous structures about the chest are unremarkable. IMPRESSION: 1. No active disease. No evidence of pneumonia or pulmonary edema. 2. Stable cardiomegaly. Electronically Signed   By: Franki Cabot M.D.   On: 09/24/2019 11:53    Procedures Procedures (including critical care time)  Medications Ordered in ED Medications  aspirin chewable tablet 81 mg (has no administration in time range)  atorvastatin (LIPITOR) tablet 20 mg (has no administration in time range)  pantoprazole (PROTONIX) EC tablet 40 mg (has no administration in time range)  ALPRAZolam (XANAX) tablet 0.25 mg (has no administration in time range)  enoxaparin (LOVENOX) injection 30 mg (has no administration in time range)  0.9 %  sodium chloride infusion (has no administration in time range)  acetaminophen (TYLENOL) tablet 650 mg (has no administration in time range)    Or  acetaminophen (TYLENOL) suppository 650 mg (has no administration in time range)  ondansetron (ZOFRAN) tablet 4 mg (has no administration in time range)    Or  ondansetron (ZOFRAN) injection 4 mg (has no administration in time range)    fentaNYL (SUBLIMAZE) injection 50 mcg (50 mcg Intravenous Given 09/24/19 1129)  metoCLOPramide (REGLAN) injection 10 mg (10 mg Intravenous Given 09/24/19 1206)  aspirin  chewable tablet 324 mg (324 mg Oral Given 09/24/19 1226)    ED Course  I have reviewed the triage vital signs and the nursing notes.  Pertinent labs & imaging results that were available during my care of the patient were reviewed by me and considered in my medical decision making (see chart for details).  Clinical Course as of Sep 24 1742  Sat Sep 24, 2019  1115 Discussed with Dr. Saunders Revel cardiology STEMI attending.  He agrees that the EKG looks abnormal.  Question whether possible limb reversal.  Agrees with current management and if troponins are elevated or EKG on repeat is similar probably would benefit from cardiology evaluation at [facility].   [MB]  1540 Patient's headache and nausea fully resolved.  Troponin not rising.  Discussed with [facility] hospitalist and asked if they would evaluate the patient for possible admission due to her very ischemic appearing EKG.  They will evaluate   [MB]    Clinical Course User Index [MB] Hayden Rasmussen, MD   MDM Rules/Calculators/A&P                         This patient complains of headache nausea generalized weakness dizziness; this involves an extensive number of treatment Options and is a complaint that carries with it a high risk of complications and Morbidity. The differential includes hypovolemia, dehydration, metabolic derangement, anemia, infection, ACS  I ordered, reviewed and interpreted labs, which included CBC with normal hemoglobin, normal white count, chemistriesWith slightly elevated creatinine and glucose, urinalysis without gross signs of infection, troponin elevated but not rising I ordered medication chewable aspirin, Reglan, fentanyl with improvement in her headache and nausea I ordered imaging studies which included chest x-ray and CT head and I independently     visualized and interpreted imaging which showed no acute findings Additional history obtained from patient's son who was with her during her last ED visit for her NSTEMI Previous records obtained and reviewed in epic including last ED visit.  I do not see any further cardiology notes since then. I consulted Dr. Saunders Revel: Cardiology and Dr. Roderic Palau [facility] hospitalist and discussed lab and imaging findings  Critical Interventions: None  After the interventions stated above, I reevaluated the patient and found patients symptoms to be improved.  I asked [facility] hospitalist to evaluate the patient in the ED to see if they think admission would benefit her.  Patient updated on plan.   Final Clinical Impression(s) / ED Diagnoses Final diagnoses:  Dizziness  Nausea  Generalized weakness    Rx / DC Orders ED Discharge Orders    None       Hayden Rasmussen, MD 09/24/19 1749

## 2019-09-25 ENCOUNTER — Observation Stay (HOSPITAL_BASED_OUTPATIENT_CLINIC_OR_DEPARTMENT_OTHER): Payer: Medicare Other

## 2019-09-25 DIAGNOSIS — I252 Old myocardial infarction: Secondary | ICD-10-CM | POA: Diagnosis not present

## 2019-09-25 DIAGNOSIS — Z79899 Other long term (current) drug therapy: Secondary | ICD-10-CM | POA: Diagnosis not present

## 2019-09-25 DIAGNOSIS — I361 Nonrheumatic tricuspid (valve) insufficiency: Secondary | ICD-10-CM | POA: Diagnosis not present

## 2019-09-25 DIAGNOSIS — Z818 Family history of other mental and behavioral disorders: Secondary | ICD-10-CM | POA: Diagnosis not present

## 2019-09-25 DIAGNOSIS — I340 Nonrheumatic mitral (valve) insufficiency: Secondary | ICD-10-CM

## 2019-09-25 DIAGNOSIS — I2510 Atherosclerotic heart disease of native coronary artery without angina pectoris: Secondary | ICD-10-CM | POA: Diagnosis present

## 2019-09-25 DIAGNOSIS — E1122 Type 2 diabetes mellitus with diabetic chronic kidney disease: Secondary | ICD-10-CM | POA: Diagnosis present

## 2019-09-25 DIAGNOSIS — R42 Dizziness and giddiness: Secondary | ICD-10-CM

## 2019-09-25 DIAGNOSIS — N179 Acute kidney failure, unspecified: Secondary | ICD-10-CM | POA: Diagnosis present

## 2019-09-25 DIAGNOSIS — I130 Hypertensive heart and chronic kidney disease with heart failure and stage 1 through stage 4 chronic kidney disease, or unspecified chronic kidney disease: Secondary | ICD-10-CM | POA: Diagnosis present

## 2019-09-25 DIAGNOSIS — I351 Nonrheumatic aortic (valve) insufficiency: Secondary | ICD-10-CM

## 2019-09-25 DIAGNOSIS — I083 Combined rheumatic disorders of mitral, aortic and tricuspid valves: Secondary | ICD-10-CM | POA: Diagnosis present

## 2019-09-25 DIAGNOSIS — Z66 Do not resuscitate: Secondary | ICD-10-CM | POA: Diagnosis present

## 2019-09-25 DIAGNOSIS — Z7982 Long term (current) use of aspirin: Secondary | ICD-10-CM | POA: Diagnosis not present

## 2019-09-25 DIAGNOSIS — Z8249 Family history of ischemic heart disease and other diseases of the circulatory system: Secondary | ICD-10-CM | POA: Diagnosis not present

## 2019-09-25 DIAGNOSIS — Z800 Family history of malignant neoplasm of digestive organs: Secondary | ICD-10-CM | POA: Diagnosis not present

## 2019-09-25 DIAGNOSIS — R531 Weakness: Secondary | ICD-10-CM | POA: Diagnosis not present

## 2019-09-25 DIAGNOSIS — N183 Chronic kidney disease, stage 3 unspecified: Secondary | ICD-10-CM | POA: Diagnosis not present

## 2019-09-25 DIAGNOSIS — I5023 Acute on chronic systolic (congestive) heart failure: Secondary | ICD-10-CM | POA: Diagnosis present

## 2019-09-25 DIAGNOSIS — F411 Generalized anxiety disorder: Secondary | ICD-10-CM | POA: Diagnosis present

## 2019-09-25 DIAGNOSIS — N1831 Chronic kidney disease, stage 3a: Secondary | ICD-10-CM | POA: Diagnosis present

## 2019-09-25 DIAGNOSIS — Z955 Presence of coronary angioplasty implant and graft: Secondary | ICD-10-CM | POA: Diagnosis not present

## 2019-09-25 DIAGNOSIS — I429 Cardiomyopathy, unspecified: Secondary | ICD-10-CM | POA: Diagnosis present

## 2019-09-25 DIAGNOSIS — Z20822 Contact with and (suspected) exposure to covid-19: Secondary | ICD-10-CM | POA: Diagnosis present

## 2019-09-25 DIAGNOSIS — E785 Hyperlipidemia, unspecified: Secondary | ICD-10-CM | POA: Diagnosis present

## 2019-09-25 DIAGNOSIS — Z823 Family history of stroke: Secondary | ICD-10-CM | POA: Diagnosis not present

## 2019-09-25 DIAGNOSIS — I255 Ischemic cardiomyopathy: Secondary | ICD-10-CM

## 2019-09-25 DIAGNOSIS — K219 Gastro-esophageal reflux disease without esophagitis: Secondary | ICD-10-CM | POA: Diagnosis present

## 2019-09-25 DIAGNOSIS — Z833 Family history of diabetes mellitus: Secondary | ICD-10-CM | POA: Diagnosis not present

## 2019-09-25 DIAGNOSIS — I5022 Chronic systolic (congestive) heart failure: Secondary | ICD-10-CM | POA: Diagnosis not present

## 2019-09-25 DIAGNOSIS — I459 Conduction disorder, unspecified: Secondary | ICD-10-CM | POA: Diagnosis present

## 2019-09-25 DIAGNOSIS — Z85820 Personal history of malignant melanoma of skin: Secondary | ICD-10-CM | POA: Diagnosis not present

## 2019-09-25 LAB — ECHOCARDIOGRAM COMPLETE
Height: 64.5 in
Weight: 2048 oz

## 2019-09-25 LAB — BASIC METABOLIC PANEL
Anion gap: 10 (ref 5–15)
BUN: 16 mg/dL (ref 8–23)
CO2: 24 mmol/L (ref 22–32)
Calcium: 8.5 mg/dL — ABNORMAL LOW (ref 8.9–10.3)
Chloride: 104 mmol/L (ref 98–111)
Creatinine, Ser: 0.93 mg/dL (ref 0.44–1.00)
GFR calc Af Amer: 60 mL/min — ABNORMAL LOW (ref >60)
GFR calc non Af Amer: 52 mL/min — ABNORMAL LOW (ref >60)
Glucose, Bld: 128 mg/dL — ABNORMAL HIGH (ref 70–99)
Potassium: 4.3 mmol/L (ref 3.5–5.1)
Sodium: 138 mmol/L (ref 135–145)

## 2019-09-25 LAB — CBC
HCT: 38.9 % (ref 36.0–46.0)
Hemoglobin: 11.5 g/dL — ABNORMAL LOW (ref 12.0–15.0)
MCH: 25.6 pg — ABNORMAL LOW (ref 26.0–34.0)
MCHC: 29.6 g/dL — ABNORMAL LOW (ref 30.0–36.0)
MCV: 86.6 fL (ref 80.0–100.0)
Platelets: 277 10*3/uL (ref 150–400)
RBC: 4.49 MIL/uL (ref 3.87–5.11)
RDW: 14.4 % (ref 11.5–15.5)
WBC: 7 10*3/uL (ref 4.0–10.5)
nRBC: 0 % (ref 0.0–0.2)

## 2019-09-25 MED ORDER — METOPROLOL SUCCINATE ER 25 MG PO TB24
25.0000 mg | ORAL_TABLET | Freq: Every day | ORAL | Status: DC
  Administered 2019-09-25 – 2019-09-26 (×2): 25 mg via ORAL
  Filled 2019-09-25 (×2): qty 1

## 2019-09-25 MED ORDER — FUROSEMIDE 10 MG/ML IJ SOLN
20.0000 mg | Freq: Once | INTRAMUSCULAR | Status: AC
  Administered 2019-09-25: 20 mg via INTRAVENOUS
  Filled 2019-09-25: qty 2

## 2019-09-25 NOTE — Care Management Obs Status (Signed)
[facility] NOTIFICATION   Patient Details  Name: Regina Good MRN: [id number] Date of Birth: [birth year]   Medicare Observation Status Notification Given:  Yes    Natasha Bence, LCSW 09/25/2019, 4:26 PM

## 2019-09-25 NOTE — Progress Notes (Signed)
*  PRELIMINARY RESULTS* Echocardiogram 2D Echocardiogram has been performed.  Regina Good 09/25/2019, 10:56 AM

## 2019-09-25 NOTE — Progress Notes (Signed)
PROGRESS NOTE    Regina Good  JQB:[id number] DOB: 1919/10/28 DOA: 09/24/2019 PCP: Celene Squibb, MD    Brief Narrative:  This is a 86 year old female who is independent and still driving, with a history of hyperlipidemia, diet-controlled diabetes, coronary artery disease.  She reported to the emergency room with generalized weakness, headache, dizziness and nausea with poor p.o. intake.  Troponins have been flat throughout hospital stay.  She was noted to have an abnormal EKG with ST depressions in T wave inversions in the inferior leads.  Echocardiogram performed shows EF of 30 to 35% with wall motion abnormalities.  She has been started on aspirin and beta-blocker.  Cardiology evaluation to help optimize her medical management.   Assessment & Plan:   Active Problems:   Type 2 diabetes mellitus with stage 3 chronic kidney disease (HCC)   HLD (hyperlipidemia)   Anxiety state   MYOCARDIAL INFARCTION, HX OF   CAD (coronary artery disease)   CHRONIC KIDNEY DISEASE STAGE III (MODERATE)   Nausea   Dizziness   Cardiomyopathy ([place])   1. Dizziness.  Likely related to poor p.o. intake she was treated with gentle IV hydration overnight and overall symptoms have resolved. 2. Nausea.  Possibly related to GERD.  On PPI.  Appears to be improving. 3. Coronary artery disease with new cardiomyopathy.  Ejection fraction of 30 to 35% with wall motion abnormalities.  She recently had elevated troponins on previous ER visit from 06/2019.  Troponins during this visit have been flat in the 20s.  Despite patient's advanced age, she is still independent.  Would start on aspirin and beta-blocker.  Request cardiology input to help optimize her medical management.  Can consider cardiac cath if cardiology/patient feels reasonable.  Since she is short of breath on exertion, will give 1 dose of Lasix. 4. Anxiety.  Continue on benzodiazepines 5. Hyperlipidemia.  Continue statin 6. Diabetes.  Diet  controlled. 7. Chronic kidney disease stage III.  Secondary to diabetes.  Creatinine currently at baseline.   DVT prophylaxis: enoxaparin (LOVENOX) injection 30 mg Start: 09/24/19 1800  Code Status: DNR Family Communication: Discussed with son, [name], over the phone Disposition Plan: Status is: Inpatient  Remains inpatient appropriate because:Ongoing diagnostic testing needed not appropriate for outpatient work up   Dispo: The patient is from: Home              Anticipated d/c is to: Home              Anticipated d/c date is: 1 day              Patient currently is not medically stable to d/c.    Consultants:     Procedures:   Echo: 1. Diffuse hypokinesis with mid and basal wall akinesis . Left  ventricular ejection fraction, by estimation, is 30 to 35%. The left  ventricle has moderately decreased function. The left ventricle  demonstrates regional wall motion abnormalities (see  scoring diagram/findings for description). The left ventricular internal  cavity size was moderately dilated. Left ventricular diastolic parameters  are indeterminate.  2. Right ventricular systolic function is normal. The right ventricular  size is normal. There is normal pulmonary artery systolic pressure.  3. Left atrial size was mildly dilated.  4. The mitral valve is degenerative. Moderate to severe mitral valve  regurgitation. No evidence of mitral stenosis.  5. The aortic valve is tricuspid. Aortic valve regurgitation is mild.  severe sclerosis with no stenosis.   Antimicrobials:  Subjective: No further dizziness.  No nausea.  She did become very short of breath on ambulation.  Objective: Vitals:   09/24/19 2203 09/25/19 0203 09/25/19 0611 09/25/19 1441  BP: (!) 124/96 113/71 (!) 137/94 (!) 188/95  Pulse: 92 87 (!) 102 (!) 102  Resp: 16 17 16 20   Temp: 98 F (36.7 C) 97.8 F (36.6 C) (!) 97.5 F (36.4 C) 97.9 F (36.6 C)  TempSrc: Oral Oral Oral Oral  SpO2: 94%  100% 97% 93%  Weight:      Height:        Intake/Output Summary (Last 24 hours) at 09/25/2019 1858 Last data filed at 09/25/2019 1837 Gross per 24 hour  Intake 2173.63 ml  Output --  Net 2173.63 ml   Filed Weights   09/24/19 1054  Weight: 58.1 kg    Examination:  General exam: Appears calm and comfortable  Respiratory system: crackles at bases. Respiratory effort normal. Cardiovascular system: S1 & S2 heard, RRR. No JVD, murmurs, rubs, gallops or clicks. No pedal edema. Gastrointestinal system: Abdomen is nondistended, soft and nontender. No organomegaly or masses felt. Normal bowel sounds heard. Central nervous system: Alert and oriented. No focal neurological deficits. Extremities: Symmetric 5 x 5 power. Skin: No rashes, lesions or ulcers Psychiatry: Judgement and insight appear normal. Mood & affect appropriate.     Data Reviewed: I have personally reviewed following labs and imaging studies  CBC: Recent Labs  Lab 09/24/19 1104 09/25/19 0710  WBC 7.4 7.0  NEUTROABS 5.4  --   HGB 12.0 11.5*  HCT 39.5 38.9  MCV 85.9 86.6  PLT 297 277   Basic Metabolic Panel: Recent Labs  Lab 09/24/19 1104 09/25/19 0710  NA 135 138  K 4.4 4.3  CL 98 104  CO2 26 24  GLUCOSE 141* 128*  BUN 16 16  CREATININE 1.18* 0.93  CALCIUM 8.7* 8.5*   GFR: Estimated Creatinine Clearance: 30.5 mL/min (by C-G formula based on SCr of 0.93 mg/dL). Liver Function Tests: Recent Labs  Lab 09/24/19 1104  AST 20  ALT 15  ALKPHOS 57  BILITOT 0.8  PROT 7.6  ALBUMIN 3.8   Recent Labs  Lab 09/24/19 1104  LIPASE 45   No results for input(s): AMMONIA in the last 168 hours. Coagulation Profile: Recent Labs  Lab 09/24/19 1104  INR 1.0   Cardiac Enzymes: No results for input(s): CKTOTAL, CKMB, CKMBINDEX, TROPONINI in the last 168 hours. BNP (last 3 results) No results for input(s): PROBNP in the last 8760 hours. HbA1C: No results for input(s): HGBA1C in the last 72  hours. CBG: No results for input(s): GLUCAP in the last 168 hours. Lipid Profile: No results for input(s): CHOL, HDL, LDLCALC, TRIG, CHOLHDL, LDLDIRECT in the last 72 hours. Thyroid Function Tests: No results for input(s): TSH, T4TOTAL, FREET4, T3FREE, THYROIDAB in the last 72 hours. Anemia Panel: No results for input(s): VITAMINB12, FOLATE, FERRITIN, TIBC, IRON, RETICCTPCT in the last 72 hours. Sepsis Labs: No results for input(s): PROCALCITON, LATICACIDVEN in the last 168 hours.  Recent Results (from the past 240 hour(s))  SARS Coronavirus 2 by RT PCR (hospital order, performed in [facility] hospital lab) Nasopharyngeal Nasopharyngeal Swab     Status: None   Collection Time: 09/24/19  4:20 PM   Specimen: Nasopharyngeal Swab  Result Value Ref Range Status   SARS Coronavirus 2 NEGATIVE NEGATIVE Final    Comment: (NOTE) SARS-CoV-2 target nucleic acids are NOT DETECTED.  The SARS-CoV-2 RNA is generally detectable in upper and  lower respiratory specimens during the acute phase of infection. The lowest concentration of SARS-CoV-2 viral copies this assay can detect is 250 copies / mL. A negative result does not preclude SARS-CoV-2 infection and should not be used as the sole basis for treatment or other patient management decisions.  A negative result may occur with improper specimen collection / handling, submission of specimen other than nasopharyngeal swab, presence of viral mutation(s) within the areas targeted by this assay, and inadequate number of viral copies (<250 copies / mL). A negative result must be combined with clinical observations, patient history, and epidemiological information.  Fact Sheet for Patients:   StrictlyIdeas.no  Fact Sheet for Healthcare Providers: BankingDealers.co.za  This test is not yet approved or  cleared by the [place] FDA and has been authorized for detection and/or diagnosis of SARS-CoV-2  by FDA under an Emergency Use Authorization (EUA).  This EUA will remain in effect (meaning this test can be used) for the duration of the COVID-19 declaration under Section 564(b)(1) of the Act, 21 U.S.C. section 360bbb-3(b)(1), unless the authorization is terminated or revoked sooner.  Performed at [facility], [address], [place], [place] [address]          Radiology Studies: CT Head Wo Contrast  Result Date: 09/24/2019 CLINICAL DATA:  Nausea, bilateral feet swelling for 5 days, headache for 1 day. EXAM: CT HEAD WITHOUT CONTRAST TECHNIQUE: Contiguous axial images were obtained from the base of the skull through the vertex without intravenous contrast. COMPARISON:  None. FINDINGS: Brain: Generalized age related parenchymal volume loss with commensurate dilatation of the ventricles and sulci. Chronic small vessel ischemic changes within the bilateral periventricular and subcortical white matter regions. No mass, hemorrhage, edema or other evidence of acute parenchymal abnormality. No extra-axial hemorrhage. Vascular: Chronic calcified atherosclerotic changes of the large vessels at the skull base. No unexpected hyperdense vessel. Skull: Normal. Negative for fracture or focal lesion. Sinuses/Orbits: No acute finding. Other: None. IMPRESSION: 1. No acute findings. No intracranial mass, hemorrhage or edema. 2. Chronic small vessel ischemic changes in the white matter. Electronically Signed   By: Franki Cabot M.D.   On: 09/24/2019 11:52   DG Chest Port 1 View  Result Date: 09/24/2019 CLINICAL DATA:  Nausea, lower extremity swelling for 5 days, headache for 1 day. History of diabetes and hypertension. EXAM: PORTABLE CHEST 1 VIEW COMPARISON:  Chest x-rays dated 07/13/2019 and 06/20/2019. FINDINGS: Stable cardiomegaly. Chronic scarring/atelectasis at the lung bases. Lungs otherwise clear. No confluent opacity to suggest a developing pneumonia. No pleural effusion or pneumothorax is seen. Osseous  structures about the chest are unremarkable. IMPRESSION: 1. No active disease. No evidence of pneumonia or pulmonary edema. 2. Stable cardiomegaly. Electronically Signed   By: Franki Cabot M.D.   On: 09/24/2019 11:53   ECHOCARDIOGRAM COMPLETE  Result Date: 09/25/2019    ECHOCARDIOGRAM REPORT   Patient Name:   Regina Good Jablonsky Date of Exam: 09/25/2019 Medical Rec #:  [id number]    Height:       64.5 in Accession #:    [id number]   Weight:       128.0 lb Date of Birth:  October 03, 1919   BSA:          1.627 m Patient Age:    89 years     BP:           137/94 mmHg Patient Gender: F            HR:  102 bpm. Exam Location:  [facility] Procedure: 2D Echo, Cardiac Doppler and Color Doppler Indications:    Abnormal ECG 794.31 / R94.31  History:        Patient has no prior history of Echocardiogram examinations. CAD                 and Previous Myocardial Infarction; Risk Factors:Dyslipidemia,                 Diabetes and Hypertension. CHRONIC KIDNEY DISEASE STAGE III                 (MODERATE), GERD.  Sonographer:    Alvino Chapel RCS Referring Phys: [place]  1. Diffuse hypokinesis with mid and basal wall akinesis . Left ventricular ejection fraction, by estimation, is 30 to 35%. The left ventricle has moderately decreased function. The left ventricle demonstrates regional wall motion abnormalities (see scoring diagram/findings for description). The left ventricular internal cavity size was moderately dilated. Left ventricular diastolic parameters are indeterminate.  2. Right ventricular systolic function is normal. The right ventricular size is normal. There is normal pulmonary artery systolic pressure.  3. Left atrial size was mildly dilated.  4. The mitral valve is degenerative. Moderate to severe mitral valve regurgitation. No evidence of mitral stenosis.  5. The aortic valve is tricuspid. Aortic valve regurgitation is mild. severe sclerosis with no stenosis. FINDINGS  Left Ventricle: Diffuse  hypokinesis with mid and basal wall akinesis. Left ventricular ejection fraction, by estimation, is 30 to 35%. The left ventricle has moderately decreased function. The left ventricle demonstrates regional wall motion abnormalities. The left ventricular internal cavity size was moderately dilated. There is no left ventricular hypertrophy. Left ventricular diastolic parameters are indeterminate. Right Ventricle: The right ventricular size is normal. No increase in right ventricular wall thickness. Right ventricular systolic function is normal. There is normal pulmonary artery systolic pressure. The tricuspid regurgitant velocity is 2.52 m/s, and  with an assumed right atrial pressure of 3 mmHg, the estimated right ventricular systolic pressure is 99.8 mmHg. Left Atrium: Left atrial size was mildly dilated. Right Atrium: Right atrial size was normal in size. Pericardium: There is no evidence of pericardial effusion. Mitral Valve: The mitral valve is degenerative in appearance. There is severe thickening of the mitral valve leaflet(s). There is severe calcification of the mitral valve leaflet(s). Severe mitral annular calcification. Moderate to severe mitral valve regurgitation. No evidence of mitral valve stenosis. Tricuspid Valve: The tricuspid valve is normal in structure. Tricuspid valve regurgitation is mild. Aortic Valve: The aortic valve is tricuspid. Aortic valve regurgitation is mild. Severe sclerosis with no stenosis. Pulmonic Valve: The pulmonic valve was normal in structure. Pulmonic valve regurgitation is mild. Aorta: The aortic root is normal in size and structure. IAS/Shunts: No atrial level shunt detected by color flow Doppler.  LEFT VENTRICLE PLAX 2D LVIDd:         5.68 cm LVIDs:         4.44 cm LV PW:         0.90 cm LV IVS:        1.05 cm LVOT diam:     1.80 cm LV SV:         42 LV SV Index:   26 LVOT Area:     2.54 cm  LV Volumes (MOD) LV vol d, MOD A2C: 105.0 ml LV vol d, MOD A4C: 77.7 ml LV vol  s, MOD A2C: 61.6 ml LV vol s, MOD  A4C: 54.3 ml LV SV MOD A2C:     43.4 ml LV SV MOD A4C:     77.7 ml LV SV MOD BP:      37.9 ml RIGHT VENTRICLE RV S prime:     8.70 cm/s TAPSE (M-mode): 1.1 cm LEFT ATRIUM              Index       RIGHT ATRIUM           Index LA diam:        3.60 cm  2.21 cm/m  RA Area:     16.00 cm LA Vol (A2C):   112.0 ml 68.84 ml/m RA Volume:   44.60 ml  27.41 ml/m LA Vol (A4C):   66.0 ml  40.57 ml/m LA Biplane Vol: 87.1 ml  53.53 ml/m  AORTIC VALVE LVOT Vmax:   98.90 cm/s LVOT Vmean:  59.900 cm/s LVOT VTI:    0.164 m  AORTA Ao Root diam: 3.10 cm MITRAL VALVE                TRICUSPID VALVE MV Area (PHT): 5.38 cm     TR Peak grad:   25.4 mmHg MV Decel Time: 141 msec     TR Vmax:        252.00 cm/s MR Peak grad: 122.8 mmHg MR Mean grad: 77.0 mmHg     SHUNTS MR Vmax:      554.00 cm/s   Systemic VTI:  0.16 m MR Vmean:     410.0 cm/s    Systemic Diam: 1.80 cm MV E velocity: 156.67 cm/s MV A velocity: 94.70 cm/s MV E/A ratio:  1.65 Jenkins Rouge MD Electronically signed by Jenkins Rouge MD Signature Date/Time: 09/25/2019/12:28:51 PM    Final         Scheduled Meds: . ALPRAZolam  0.25 mg Oral QHS  . aspirin  81 mg Oral Daily  . atorvastatin  20 mg Oral q1800  . enoxaparin (LOVENOX) injection  30 mg Subcutaneous Q24H  . metoprolol succinate  25 mg Oral Daily  . pantoprazole  40 mg Oral Daily   Continuous Infusions:   LOS: 0 days    Time spent: 48mins    Kathie Dike, MD [facility]   If 7PM-7AM, please contact night-coverage [email or web address]  09/25/2019, 6:58 PM

## 2019-09-26 DIAGNOSIS — I429 Cardiomyopathy, unspecified: Secondary | ICD-10-CM

## 2019-09-26 DIAGNOSIS — I5022 Chronic systolic (congestive) heart failure: Secondary | ICD-10-CM

## 2019-09-26 MED ORDER — OMEPRAZOLE 20 MG PO CPDR: 20.0000 mg | DELAYED_RELEASE_CAPSULE | Freq: Every day | ORAL | 3 refills | Status: DC

## 2019-09-26 MED ORDER — ONDANSETRON HCL 4 MG PO TABS: 4.0000 mg | ORAL_TABLET | Freq: Four times a day (QID) | ORAL | 0 refills | Status: DC | PRN

## 2019-09-26 MED ORDER — FUROSEMIDE 10 MG/ML IJ SOLN
20.0000 mg | Freq: Once | INTRAMUSCULAR | Status: AC
  Administered 2019-09-26: 20 mg via INTRAVENOUS
  Filled 2019-09-26: qty 2

## 2019-09-26 MED ORDER — SPIRONOLACTONE 25 MG PO TABS: 12.5000 mg | ORAL_TABLET | Freq: Every day | ORAL | 3 refills | Status: DC

## 2019-09-26 MED ORDER — ALPRAZOLAM 0.25 MG PO TABS: 0.2500 mg | ORAL_TABLET | Freq: Every day | ORAL | 0 refills | Status: DC

## 2019-09-26 MED ORDER — METOPROLOL SUCCINATE ER 25 MG PO TB24: 25.0000 mg | ORAL_TABLET | Freq: Every day | ORAL | 3 refills | Status: DC

## 2019-09-26 MED ORDER — ASPIRIN 81 MG PO CHEW
81.0000 mg | CHEWABLE_TABLET | Freq: Every day | ORAL | 11 refills | Status: DC
Start: 2019-09-26 — End: 2021-09-01

## 2019-09-26 MED ORDER — ALUM & MAG HYDROXIDE-SIMETH 200-200-20 MG/5ML PO SUSP
30.0000 mL | Freq: Four times a day (QID) | ORAL | Status: DC | PRN
  Administered 2019-09-26 (×2): 30 mL via ORAL
  Filled 2019-09-26 (×2): qty 30

## 2019-09-26 MED ORDER — SPIRONOLACTONE 25 MG PO TABS
12.5000 mg | ORAL_TABLET | Freq: Every day | ORAL | Status: DC
  Administered 2019-09-26: 12.5 mg via ORAL
  Filled 2019-09-26: qty 1
  Filled 2019-09-26 (×3): qty 0.5

## 2019-09-26 MED ORDER — FUROSEMIDE 40 MG PO TABS: 40.0000 mg | ORAL_TABLET | ORAL | 2 refills | Status: DC

## 2019-09-26 MED ORDER — ATORVASTATIN CALCIUM 20 MG PO TABS
20.0000 mg | ORAL_TABLET | Freq: Every day | ORAL | 2 refills | Status: DC
Start: 2019-09-26 — End: 2021-09-01

## 2019-09-26 MED ORDER — FUROSEMIDE 40 MG PO TABS: 40.0000 mg | ORAL_TABLET | ORAL | Status: DC

## 2019-09-26 MED ORDER — ACETAMINOPHEN 325 MG PO TABS: 650.0000 mg | ORAL_TABLET | Freq: Four times a day (QID) | ORAL | 1 refills | Status: AC | PRN

## 2019-09-26 NOTE — Discharge Summary (Signed)
Regina Good, is a 86 y.o. female  DOB [birth year]  MRN [id number].  Admission date:  09/24/2019  Admitting Physician  Kathie Dike, MD  Discharge Date:  09/26/2019   Primary MD  Celene Squibb, MD  Recommendations for primary care physician for things to follow:  1)Very low-salt diet advised--- avoid excessive salt intake, okay to use Mrs. Dash as  a salt  substitute 2)Weigh yourself daily, call if you gain more than 3 pounds in 1 day or more than 5 pounds in 1 week as your diuretic medications may need to be adjusted 3)Limit your Fluid  intake to no more than 60 ounces (1.8 Liters) per day 4) repeat BMP blood test advised within 1 week  Admission Diagnosis  Dizziness [R42] Nausea [R11.0] Generalized weakness [R53.1] Cardiomyopathy (HCC) [I42.9]   Discharge Diagnosis  Dizziness [R42] Nausea [R11.0] Generalized weakness [R53.1] Cardiomyopathy ([place]) [I42.9]    Active Problems:   Type 2 diabetes mellitus with stage 3 chronic kidney disease (HCC)   HLD (hyperlipidemia)   Anxiety state   MYOCARDIAL INFARCTION, HX OF   CAD (coronary artery disease)   CHRONIC KIDNEY DISEASE STAGE III (MODERATE)   Nausea   Dizziness   Cardiomyopathy ([place])      Past Medical History:  Diagnosis Date  . Anxiety   . Diabetes mellitus   . Family history of diabetes mellitus   . GERD (gastroesophageal reflux disease)   . History of melanoma   . Hyperlipidemia   . Hypertension     Past Surgical History:  Procedure Laterality Date  . APPENDECTOMY    . CORONARY STENT PLACEMENT  2004  . NM MYOVIEW LTD  2010  . RENAL ARTERY STENT  2006     HPI  from the history and physical done on the day of admission:    Chief Complaint: generalized weakness, nausea, headache  HPI: Regina Good is a 86 y.o. female with medical history significant of hyperlipidemia, diet-controlled diabetes, coronary artery disease with MI  in the past, had come to the emergency room on 07/13/2019 where she was found to have elevated troponins in the 500 range.  She did not want to be admitted for further work-up and decided to discharge home.  She reports that since that time she has felt unwell.  More recently, she reports over the past 5 days she has had intermittent nausea and decreased p.o. intake.  She is still dizzy on standing.  She has felt generally weak.  She is not had any chest pain or shortness of breath.  She also had a left-sided frontal headache which is also been intermittent.  No vision changes.  No vomiting or diarrhea.  ED Course: Vitals noted to be stable.  Basic labs unrevealing.  Troponin in the 20s range x2.  EKG showed changes of ST depression in inferior leads with T wave inversions.  Case was reviewed with cardiologist on-call who did not feel the patient met criteria for STEMI.  She has been referred for admission.  Review of Systems: As per HPI otherwise 10 point review of systems negative.     Hospital Course:    Brief Summary:- - 86 y.o. female with a hx of CAD (prior angioplasty and stent placement back in 2005 at [facility]), HTN, HLD, DM diet controlled  admitted with new onset myopathy/CHF and flat troponins    A/p 1)HFrEF--Echo with EF of 30 to 35% with regional wall motion abnormalities and diffuse hypokinesis-patient presenting with acute on chronic systolic CHF exacerbation --Elevated BNP and dyspnea noted on admission\- -Lasix and Aldactone as ordered, -Outpatient follow-up for repeat BMP  2)CAD--status post prior NSTEMI in April 2021,, status post prior angioplasty and stent placement in 2005 at [facility] --Cardiology input/consult appreciated, currently on aspirin, Lipitor and metoprolol  3)moderate to severe mitral regurg--expectant management as per cardiology  4)DM2-no recent A1c, patient is usually diet-controlled  5)CKD IIIa--- stable, avoid nephrotoxic agents especially  NSAIDs  6)social/Ethics-----Please note that despite patient's advanced age PTA patient was still driving and relatively independent and actually 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   Specialty: Internal Medicine Why: Appointment time 8:40AM Contact information: [facility] [address] [phone]        Arnoldo Lenis, MD .   Specialty: Cardiology Contact information: [address] [place] [address] [phone]                Consults obtained - cardiology  Diet and Activity recommendation:  As advised  Discharge Instructions    Discharge Instructions    Call MD for:  difficulty breathing, headache or visual disturbances   Complete by: As directed    Call MD for:  persistant dizziness or light-headedness   Complete by: As directed    Call MD for:  persistant nausea and vomiting   Complete by: As directed    Call MD for:  severe uncontrolled pain   Complete by: As directed    Call MD for:  temperature >100.4   Complete by: As directed    Diet - low sodium heart healthy   Complete by: As directed    Discharge instructions   Complete by: As directed    1)Very low-salt diet advised--- avoid excessive salt intake, okay to use Mrs. Dash as  a salt  substitute 2)Weigh yourself daily, call if you gain more than 3 pounds in 1 day or more than 5 pounds in 1 week as your diuretic medications may need to be adjusted 3)Limit your Fluid  intake to no more than 60 ounces (1.8 Liters) per day 4) repeat BMP blood test advised within 1 week   Increase activity slowly   Complete by: As directed         Discharge Medications     Allergies as of 09/26/2019      Reactions   Codeine Other (See Comments)   Sensation of being out of this world.   Sulfonamide Derivatives Other (See Comments)    Makes her feel like dreaming while awake.      Medication List    STOP taking these medications   sucralfate 1 g tablet Commonly known as: Carafate     TAKE these medications   acetaminophen 325 MG tablet Commonly known as: TYLENOL Take 2 tablets (650 mg total) by mouth every 6 (six) hours  as needed for mild pain (or Fever >/= 101).   ALPRAZolam 0.25 MG tablet Commonly known as: XANAX Take 1 tablet (0.25 mg total) by mouth at bedtime. What changed:   how much to take  when to take this  additional instructions   aspirin 81 MG chewable tablet Chew 1 tablet (81 mg total) by mouth daily with breakfast. What changed: when to take this   atorvastatin 20 MG tablet Commonly known as: Lipitor Take 1 tablet (20 mg total) by mouth daily.   furosemide 40 MG tablet Commonly known as: LASIX Take 1 tablet (40 mg total) by mouth every Monday, Wednesday, and Friday. Start taking on: September 28, 2019 What changed:   medication strength  how much to take  when to take this   metoprolol succinate 25 MG 24 hr tablet Commonly known as: TOPROL-XL Take 1 tablet (25 mg total) by mouth daily. Start taking on: September 27, 2019   omeprazole 20 MG capsule Commonly known as: PRILOSEC Take 1 capsule (20 mg total) by mouth daily.   ondansetron 4 MG tablet Commonly known as: ZOFRAN Take 1 tablet (4 mg total) by mouth every 6 (six) hours as needed for nausea.   spironolactone 25 MG tablet Commonly known as: ALDACTONE Take 0.5 tablets (12.5 mg total) by mouth daily. Start taking on: September 27, 2019       Major procedures and Radiology Reports - PLEASE review detailed and final reports for all details, in brief -   CT Head Wo Contrast  Result Date: 09/24/2019 CLINICAL DATA:  Nausea, bilateral feet swelling for 5 days, headache for 1 day. EXAM: CT HEAD WITHOUT CONTRAST TECHNIQUE: Contiguous axial images were obtained from the base of the skull through the vertex without intravenous  contrast. COMPARISON:  None. FINDINGS: Brain: Generalized age related parenchymal volume loss with commensurate dilatation of the ventricles and sulci. Chronic small vessel ischemic changes within the bilateral periventricular and subcortical white matter regions. No mass, hemorrhage, edema or other evidence of acute parenchymal abnormality. No extra-axial hemorrhage. Vascular: Chronic calcified atherosclerotic changes of the large vessels at the skull base. No unexpected hyperdense vessel. Skull: Normal. Negative for fracture or focal lesion. Sinuses/Orbits: No acute finding. Other: None. IMPRESSION: 1. No acute findings. No intracranial mass, hemorrhage or edema. 2. Chronic small vessel ischemic changes in the white matter. Electronically Signed   By: Franki Cabot M.D.   On: 09/24/2019 11:52   DG Chest Port 1 View  Result Date: 09/24/2019 CLINICAL DATA:  Nausea, lower extremity swelling for 5 days, headache for 1 day. History of diabetes and hypertension. EXAM: PORTABLE CHEST 1 VIEW COMPARISON:  Chest x-rays dated 07/13/2019 and 06/20/2019. FINDINGS: Stable cardiomegaly. Chronic scarring/atelectasis at the lung bases. Lungs otherwise clear. No confluent opacity to suggest a developing pneumonia. No pleural effusion or pneumothorax is seen. Osseous structures about the chest are unremarkable. IMPRESSION: 1. No active disease. No evidence of pneumonia or pulmonary edema. 2. Stable cardiomegaly. Electronically Signed   By: Franki Cabot M.D.   On: 09/24/2019 11:53   ECHOCARDIOGRAM COMPLETE  Result Date: 09/25/2019    ECHOCARDIOGRAM REPORT   Patient Name:   ALEXZANDREA NORMINGTON Bares Date of Exam: 09/25/2019 Medical Rec #:  [id number]    Height:       64.5 in Accession #:    [id number]   Weight:       128.0 lb Date of Birth:  03-10-1920   BSA:          1.627  m Patient Age:    50 years     BP:           137/94 mmHg Patient Gender: F            HR:           102 bpm. Exam Location:  [facility] Procedure: 2D Echo, Cardiac  Doppler and Color Doppler Indications:    Abnormal ECG 794.31 / R94.31  History:        Patient has no prior history of Echocardiogram examinations. CAD                 and Previous Myocardial Infarction; Risk Factors:Dyslipidemia,                 Diabetes and Hypertension. CHRONIC KIDNEY DISEASE STAGE III                 (MODERATE), GERD.  Sonographer:    Alvino Chapel RCS Referring Phys: [place]  1. Diffuse hypokinesis with mid and basal wall akinesis . Left ventricular ejection fraction, by estimation, is 30 to 35%. The left ventricle has moderately decreased function. The left ventricle demonstrates regional wall motion abnormalities (see scoring diagram/findings for description). The left ventricular internal cavity size was moderately dilated. Left ventricular diastolic parameters are indeterminate.  2. Right ventricular systolic function is normal. The right ventricular size is normal. There is normal pulmonary artery systolic pressure.  3. Left atrial size was mildly dilated.  4. The mitral valve is degenerative. Moderate to severe mitral valve regurgitation. No evidence of mitral stenosis.  5. The aortic valve is tricuspid. Aortic valve regurgitation is mild. severe sclerosis with no stenosis. FINDINGS  Left Ventricle: Diffuse hypokinesis with mid and basal wall akinesis. Left ventricular ejection fraction, by estimation, is 30 to 35%. The left ventricle has moderately decreased function. The left ventricle demonstrates regional wall motion abnormalities. The left ventricular internal cavity size was moderately dilated. There is no left ventricular hypertrophy. Left ventricular diastolic parameters are indeterminate. Right Ventricle: The right ventricular size is normal. No increase in right ventricular wall thickness. Right ventricular systolic function is normal. There is normal pulmonary artery systolic pressure. The tricuspid regurgitant velocity is 2.52 m/s, and  with an assumed  right atrial pressure of 3 mmHg, the estimated right ventricular systolic pressure is 47.8 mmHg. Left Atrium: Left atrial size was mildly dilated. Right Atrium: Right atrial size was normal in size. Pericardium: There is no evidence of pericardial effusion. Mitral Valve: The mitral valve is degenerative in appearance. There is severe thickening of the mitral valve leaflet(s). There is severe calcification of the mitral valve leaflet(s). Severe mitral annular calcification. Moderate to severe mitral valve regurgitation. No evidence of mitral valve stenosis. Tricuspid Valve: The tricuspid valve is normal in structure. Tricuspid valve regurgitation is mild. Aortic Valve: The aortic valve is tricuspid. Aortic valve regurgitation is mild. Severe sclerosis with no stenosis. Pulmonic Valve: The pulmonic valve was normal in structure. Pulmonic valve regurgitation is mild. Aorta: The aortic root is normal in size and structure. IAS/Shunts: No atrial level shunt detected by color flow Doppler.  LEFT VENTRICLE PLAX 2D LVIDd:         5.68 cm LVIDs:         4.44 cm LV PW:         0.90 cm LV IVS:        1.05 cm LVOT diam:     1.80 cm LV SV:  42 LV SV Index:   26 LVOT Area:     2.54 cm  LV Volumes (MOD) LV vol d, MOD A2C: 105.0 ml LV vol d, MOD A4C: 77.7 ml LV vol s, MOD A2C: 61.6 ml LV vol s, MOD A4C: 54.3 ml LV SV MOD A2C:     43.4 ml LV SV MOD A4C:     77.7 ml LV SV MOD BP:      37.9 ml RIGHT VENTRICLE RV S prime:     8.70 cm/s TAPSE (M-mode): 1.1 cm LEFT ATRIUM              Index       RIGHT ATRIUM           Index LA diam:        3.60 cm  2.21 cm/m  RA Area:     16.00 cm LA Vol (A2C):   112.0 ml 68.84 ml/m RA Volume:   44.60 ml  27.41 ml/m LA Vol (A4C):   66.0 ml  40.57 ml/m LA Biplane Vol: 87.1 ml  53.53 ml/m  AORTIC VALVE LVOT Vmax:   98.90 cm/s LVOT Vmean:  59.900 cm/s LVOT VTI:    0.164 m  AORTA Ao Root diam: 3.10 cm MITRAL VALVE                TRICUSPID VALVE MV Area (PHT): 5.38 cm     TR Peak grad:   25.4  mmHg MV Decel Time: 141 msec     TR Vmax:        252.00 cm/s MR Peak grad: 122.8 mmHg MR Mean grad: 77.0 mmHg     SHUNTS MR Vmax:      554.00 cm/s   Systemic VTI:  0.16 m MR Vmean:     410.0 cm/s    Systemic Diam: 1.80 cm MV E velocity: 156.67 cm/s MV A velocity: 94.70 cm/s MV E/A ratio:  1.65 Charlton Haws MD Electronically signed by Charlton Haws MD Signature Date/Time: 09/25/2019/12:28:51 PM    Final     Micro Results   Recent Results (from the past 240 hour(s))  SARS Coronavirus 2 by RT PCR (hospital order, performed in [facility] hospital lab) Nasopharyngeal Nasopharyngeal Swab     Status: None   Collection Time: 09/24/19  4:20 PM   Specimen: Nasopharyngeal Swab  Result Value Ref Range Status   SARS Coronavirus 2 NEGATIVE NEGATIVE Final    Comment: (NOTE) SARS-CoV-2 target nucleic acids are NOT DETECTED.  The SARS-CoV-2 RNA is generally detectable in upper and lower respiratory specimens during the acute phase of infection. The lowest concentration of SARS-CoV-2 viral copies this assay can detect is 250 copies / mL. A negative result does not preclude SARS-CoV-2 infection and should not be used as the sole basis for treatment or other patient management decisions.  A negative result may occur with improper specimen collection / handling, submission of specimen other than nasopharyngeal swab, presence of viral mutation(s) within the areas targeted by this assay, and inadequate number of viral copies (<250 copies / mL). A negative result must be combined with clinical observations, patient history, and epidemiological information.  Fact Sheet for Patients:   BoilerBrush.com.cy  Fact Sheet for Healthcare Providers: https://pope.com/  This test is not yet approved or  cleared by the [place] FDA and has been authorized for detection and/or diagnosis of SARS-CoV-2 by FDA under an Emergency Use Authorization (EUA).  This EUA will  remain in effect (meaning this test can  be used) for the duration of the COVID-19 declaration under Section 564(b)(1) of the Act, 21 U.S.C. section 360bbb-3(b)(1), unless the authorization is terminated or revoked sooner.  Performed at [facility], [address], [place], [place] [address]        Today   Subjective    Iyahna Obriant today has no new complaints -Eager to go home, no shortness of breath at rest, no dyspnea on exertion, --Post ambulation O2 sats 95 to 97% on room air -Female relative at bedside, questions answered          Patient has been seen and examined prior to discharge   Objective   Blood pressure 136/79, pulse 90, temperature (!) 97.2 F (36.2 C), resp. rate 18, height 5' 4.5" (1.638 m), weight 58.1 kg, SpO2 93 %.   Intake/Output Summary (Last 24 hours) at 09/26/2019 1229 Last data filed at 09/26/2019 0920 Gross per 24 hour  Intake 960 ml  Output --  Net 960 ml    Exam Gen:- Awake Alert, no acute distress, speaking in complete sentences HEENT:- [place].AT, No sclera icterus Neck-Supple Neck,No JVD,.  Lungs-  CTAB , good air movement bilaterally  CV- S1, S2 normal, regular 3/6 mumur Abd-  +ve B.Sounds, Abd Soft, No tenderness,    Extremity/Skin:- No  edema,   good pulses Psych-affect is appropriate, oriented x3 Neuro-no new focal deficits, no tremors    Data Review   CBC w Diff:  Lab Results  Component Value Date   WBC 7.0 09/25/2019   HGB 11.5 (L) 09/25/2019   HCT 38.9 09/25/2019   PLT 277 09/25/2019   LYMPHOPCT 19 09/24/2019   MONOPCT 6 09/24/2019   EOSPCT 2 09/24/2019   BASOPCT 0 09/24/2019    CMP:  Lab Results  Component Value Date   NA 138 09/25/2019   K 4.3 09/25/2019   CL 104 09/25/2019   CO2 24 09/25/2019   BUN 16 09/25/2019   CREATININE 0.93 09/25/2019   PROT 7.6 09/24/2019   ALBUMIN 3.8 09/24/2019   BILITOT 0.8 09/24/2019   ALKPHOS 57 09/24/2019   AST 20 09/24/2019   ALT 15 09/24/2019  .   Total Discharge time is  about 33 minutes  Roxan Hockey M.D on 09/26/2019 at 12:29 PM  Go to [email or web address] -  for contact info  [facility] - Office  [phone]

## 2019-09-26 NOTE — Discharge Instructions (Signed)
1)Very low-salt diet advised--- avoid excessive salt intake, okay to use Mrs. Dash as  a salt  substitute 2)Weigh yourself daily, call if you gain more than 3 pounds in 1 day or more than 5 pounds in 1 week as your diuretic medications may need to be adjusted 3)Limit your Fluid  intake to no more than 60 ounces (1.8 Liters) per day 4) repeat BMP blood test advised within 1 week

## 2019-09-26 NOTE — Consult Note (Addendum)
Cardiology Consultation:   Patient ID: Regina Good MRN: [id number]; DOB: 1919-12-31  Admit date: 09/24/2019 Date of Consult: 09/26/2019  Primary Care Provider: Celene Squibb, MD [facility] Cardiologist: Carlyle Dolly, MD [facility] Electrophysiologist:  None    Patient Profile:   Regina Good is a 86 y.o. female with a hx of CAD, HTN, HLD, DM diet controlled who is being seen today for the evaluation of CHF and new cardiomyopathy at the request of [facility]on.  History of Present Illness:   Regina Good who is independent and still driving, takes care of her 18 yr old daughter with schitzophrenia. Patient has a CAD stent at [facility], history of hyperlipidemia, diet-controlled diabetes. She presented to the ED with generalized weakness, headache, dizziness and nausea with poor p.o. intake. Troponins have been flat She was noted to have an abnormal EKG with ST depressions in T wave inversions in the inferior leads-reviewed by Dr. Saunders Revel who didn't feel she had STEMI. Echocardiogram performed shows EF of 30 to 35% with wall motion abnormalities. She has been started on aspirin and beta-blocker. In ED 06/2019 with vague complaints and had Troponin 512 at that time-refused work up.  Patient denies any chest pain. Had DOE when walking yesterday and was given lasix. Does not want any testing or procedures done and makes it clear she is ready to die given her age. She also is ready to go to a nursing facility if needed. Just wants meds to keep her comfortable.    Past Medical History:  Diagnosis Date  . Anxiety   . Diabetes mellitus   . Family history of diabetes mellitus   . GERD (gastroesophageal reflux disease)   . History of melanoma   . Hyperlipidemia   . Hypertension     Past Surgical History:  Procedure Laterality Date  . APPENDECTOMY    . CORONARY STENT PLACEMENT  2004  . NM MYOVIEW LTD  2010  . RENAL ARTERY STENT  2006     Home Medications:  Prior to Admission  medications   Medication Sig Start Date End Date Taking? Authorizing Provider  ALPRAZolam Duanne Moron) 0.25 MG tablet Take 1-1.5 tablets by mouth daily. Takes 1 tab HS to help with sleep. If having bad day, takes 0.5 tab during the day if needed. 09/19/19  Yes [provider]  aspirin 81 MG chewable tablet Chew 1 tablet (81 mg total) by mouth daily. 07/13/19  Yes Idol, Almyra Free, PA-C  furosemide (LASIX) 20 MG tablet Take 10 mg by mouth daily. 08/12/19  Yes [provider]  atorvastatin (LIPITOR) 20 MG tablet Take 1 tablet (20 mg total) by mouth daily. 07/13/19   Evalee Jefferson, PA-C  omeprazole (PRILOSEC) 20 MG capsule Take 20 mg by mouth daily. 07/12/19   [provider]  sucralfate (CARAFATE) 1 g tablet Take 1 tablet (1 g total) by mouth 4 (four) times daily as needed. Patient not taking: Reported on 07/13/2019 06/20/19   Maudie Flakes, MD    Inpatient Medications: Scheduled Meds: . ALPRAZolam  0.25 mg Oral QHS  . aspirin  81 mg Oral Daily  . atorvastatin  20 mg Oral q1800  . enoxaparin (LOVENOX) injection  30 mg Subcutaneous Q24H  . metoprolol succinate  25 mg Oral Daily  . pantoprazole  40 mg Oral Daily   Continuous Infusions:  PRN Meds: acetaminophen **OR** acetaminophen, alum & mag hydroxide-simeth, ondansetron **OR** ondansetron (ZOFRAN) IV  Allergies:    Allergies  Allergen Reactions  .  Codeine Other (See Comments)    Sensation of being out of this world.  . Sulfonamide Derivatives Other (See Comments)    Makes her feel like dreaming while awake.    Social History:   Social History   Socioeconomic History  . Marital status: Widowed    Spouse name: Not on file  . Number of children: Not on file  . Years of education: Not on file  . Highest education level: Not on file  Occupational History  . Occupation: Retired, previously a Armed forces logistics/support/administrative officer  . Smoking status: Never Smoker  . Smokeless tobacco: Never Used  Vaping Use  . Vaping Use: Never used    Substance and Sexual Activity  . Alcohol use: No  . Drug use: No  . Sexual activity: Not on file  Other Topics Concern  . Not on file  Social History Narrative   Lives with spouse who is 50 and schizophrenic daughter   Social Determinants of Radio broadcast assistant Strain:   . Difficulty of Paying Living Expenses:   Food Insecurity:   . Worried About Charity fundraiser in the Last Year:   . Arboriculturist in the Last Year:   Transportation Needs:   . Film/video editor (Medical):   Marland Kitchen Lack of Transportation (Non-Medical):   Physical Activity:   . Days of Exercise per Week:   . Minutes of Exercise per Session:   Stress:   . Feeling of Stress :   Social Connections:   . Frequency of Communication with Friends and Family:   . Frequency of Social Gatherings with Friends and Family:   . Attends Religious Services:   . Active Member of Clubs or Organizations:   . Attends Archivist Meetings:   Marland Kitchen Marital Status:   Intimate Partner Violence:   . Fear of Current or Ex-Partner:   . Emotionally Abused:   Marland Kitchen Physically Abused:   . Sexually Abused:     Family History:     Family History  Problem Relation Age of Onset  . Pancreatic cancer Mother   . Coronary artery disease Father   . Diabetes Brother   . Colon cancer Sister   . Stroke Sister   . Stroke Sister   . Diabetes Son   . Stroke Son   . Schizophrenia Daughter      ROS:  Please see the history of present illness.  Review of Systems  Constitutional: Negative.  HENT: Negative.   Eyes: Negative.   Cardiovascular: Positive for dyspnea on exertion.  Respiratory: Negative.   Hematologic/Lymphatic: Negative.   Musculoskeletal: Negative.  Negative for joint pain.  Gastrointestinal: Positive for abdominal pain and heartburn.  Genitourinary: Negative.   Neurological: Negative.     All other ROS reviewed and negative.     Physical Exam/Data:   Vitals:   09/25/19 0611 09/25/19 1441 09/25/19  2104 09/26/19 0515  BP: (!) 137/94 (!) 188/95 (!) 135/93 136/79  Pulse: (!) 102 (!) 102 (!) 101 90  Resp: 16 20 20 18   Temp: (!) 97.5 F (36.4 C) 97.9 F (36.6 C) 98.5 F (36.9 C) (!) 97.2 F (36.2 C)  TempSrc: Oral Oral Oral   SpO2: 97% 93% 94% 93%  Weight:      Height:        Intake/Output Summary (Last 24 hours) at 09/26/2019 0830 Last data filed at 09/25/2019 1837 Gross per 24 hour  Intake 1200 ml  Output --  Net  1200 ml   Last 3 Weights 09/24/2019 07/13/2019 06/20/2019  Weight (lbs) 128 lb 130 lb 126 lb 1.7 oz  Weight (kg) 58.06 kg 58.968 kg 57.2 kg     Body mass index is 21.63 kg/m.  General:  Thin, in no acute distress HEENT: normal Lymph: no adenopathy Neck: increased JVD Endocrine:  No thryomegaly Vascular: No carotid bruits; FA pulses 2+ bilaterally without bruits  Cardiac:  normal S1, S2; RRR; 2/6 systolic murmur at the left sternal border Lungs: Decreased breath sounds with bibasilar rales  Abd: soft, nontender, no hepatomegaly  Ext: no edema Musculoskeletal:  No deformities, BUE and BLE strength normal and equal Skin: warm and dry  Neuro:  CNs 2-12 intact, no focal abnormalities noted Psych:  Normal affect   EKG:  The EKG was personally reviewed and demonstrates: Sinus tachycardia with intraventricular conduction delay frequent PVCs Telemetry:  Telemetry was personally reviewed and demonstrates: Normal sinus rhythm with PVCs and bigeminy  Relevant CV Studies: Echo 09/25/2019 IMPRESSIONS     1. Diffuse hypokinesis with mid and basal wall akinesis . Left  ventricular ejection fraction, by estimation, is 30 to 35%. The left  ventricle has moderately decreased function. The left ventricle  demonstrates regional wall motion abnormalities (see  scoring diagram/findings for description). The left ventricular internal  cavity size was moderately dilated. Left ventricular diastolic parameters  are indeterminate.   2. Right ventricular systolic function is  normal. The right ventricular  size is normal. There is normal pulmonary artery systolic pressure.   3. Left atrial size was mildly dilated.   4. The mitral valve is degenerative. Moderate to severe mitral valve  regurgitation. No evidence of mitral stenosis.   5. The aortic valve is tricuspid. Aortic valve regurgitation is mild.  severe sclerosis with no stenosis.   FINDINGS   Left Ventricle: Diffuse hypokinesis with mid and basal wall akinesis.  Left ventricular ejection fraction, by estimation, is 30 to 35%. The left  ventricle has moderately decreased function. The left ventricle  demonstrates regional wall motion  abnormalities. The left ventricular internal cavity size was moderately  dilated. There is no left ventricular hypertrophy. Left ventricular  diastolic parameters are indeterminate.   Laboratory Data:  High Sensitivity Troponin:   Recent Labs  Lab 09/24/19 1104 09/24/19 1259 09/24/19 1639 09/24/19 1958  TROPONINIHS 28* 27* 30* 29*     Chemistry Recent Labs  Lab 09/24/19 1104 09/25/19 0710  NA 135 138  K 4.4 4.3  CL 98 104  CO2 26 24  GLUCOSE 141* 128*  BUN 16 16  CREATININE 1.18* 0.93  CALCIUM 8.7* 8.5*  GFRNONAA 39* 52*  GFRAA 45* 60*  ANIONGAP 11 10    Recent Labs  Lab 09/24/19 1104  PROT 7.6  ALBUMIN 3.8  AST 20  ALT 15  ALKPHOS 57  BILITOT 0.8   Hematology Recent Labs  Lab 09/24/19 1104 09/25/19 0710  WBC 7.4 7.0  RBC 4.60 4.49  HGB 12.0 11.5*  HCT 39.5 38.9  MCV 85.9 86.6  MCH 26.1 25.6*  MCHC 30.4 29.6*  RDW 14.5 14.4  PLT 297 277   BNP Recent Labs  Lab 09/24/19 1639  BNP 1,614.0*    DDimer No results for input(s): DDIMER in the last 168 hours.   Radiology/Studies:  CT Head Wo Contrast  Result Date: 09/24/2019 CLINICAL DATA:  Nausea, bilateral feet swelling for 5 days, headache for 1 day. EXAM: CT HEAD WITHOUT CONTRAST TECHNIQUE: Contiguous axial images were obtained from the  base of the skull through the vertex  without intravenous contrast. COMPARISON:  None. FINDINGS: Brain: Generalized age related parenchymal volume loss with commensurate dilatation of the ventricles and sulci. Chronic small vessel ischemic changes within the bilateral periventricular and subcortical white matter regions. No mass, hemorrhage, edema or other evidence of acute parenchymal abnormality. No extra-axial hemorrhage. Vascular: Chronic calcified atherosclerotic changes of the large vessels at the skull base. No unexpected hyperdense vessel. Skull: Normal. Negative for fracture or focal lesion. Sinuses/Orbits: No acute finding. Other: None. IMPRESSION: 1. No acute findings. No intracranial mass, hemorrhage or edema. 2. Chronic small vessel ischemic changes in the white matter. Electronically Signed   By: Franki Cabot M.D.   On: 09/24/2019 11:52   DG Chest Port 1 View  Result Date: 09/24/2019 CLINICAL DATA:  Nausea, lower extremity swelling for 5 days, headache for 1 day. History of diabetes and hypertension. EXAM: PORTABLE CHEST 1 VIEW COMPARISON:  Chest x-rays dated 07/13/2019 and 06/20/2019. FINDINGS: Stable cardiomegaly. Chronic scarring/atelectasis at the lung bases. Lungs otherwise clear. No confluent opacity to suggest a developing pneumonia. No pleural effusion or pneumothorax is seen. Osseous structures about the chest are unremarkable. IMPRESSION: 1. No active disease. No evidence of pneumonia or pulmonary edema. 2. Stable cardiomegaly. Electronically Signed   By: Franki Cabot M.D.   On: 09/24/2019 11:53   ECHOCARDIOGRAM COMPLETE  Result Date: 09/25/2019    ECHOCARDIOGRAM REPORT   Patient Name:   Regina Good Date of Exam: 09/25/2019 Medical Rec #:  [id number]    Height:       64.5 in Accession #:    [id number]   Weight:       128.0 lb Date of Birth:  11/17/1919   BSA:          1.627 m Patient Age:    75 years     BP:           137/94 mmHg Patient Gender: F            HR:           102 bpm. Exam Location:  [facility] Procedure:  2D Echo, Cardiac Doppler and Color Doppler Indications:    Abnormal ECG 794.31 / R94.31  History:        Patient has no prior history of Echocardiogram examinations. CAD                 and Previous Myocardial Infarction; Risk Factors:Dyslipidemia,                 Diabetes and Hypertension. CHRONIC KIDNEY DISEASE STAGE III                 (MODERATE), GERD.  Sonographer:    Alvino Chapel RCS Referring Phys: [place]  1. Diffuse hypokinesis with mid and basal wall akinesis . Left ventricular ejection fraction, by estimation, is 30 to 35%. The left ventricle has moderately decreased function. The left ventricle demonstrates regional wall motion abnormalities (see scoring diagram/findings for description). The left ventricular internal cavity size was moderately dilated. Left ventricular diastolic parameters are indeterminate.  2. Right ventricular systolic function is normal. The right ventricular size is normal. There is normal pulmonary artery systolic pressure.  3. Left atrial size was mildly dilated.  4. The mitral valve is degenerative. Moderate to severe mitral valve regurgitation. No evidence of mitral stenosis.  5. The aortic valve is tricuspid. Aortic valve regurgitation is mild. severe sclerosis with no stenosis. FINDINGS  Left Ventricle: Diffuse hypokinesis with mid and basal wall akinesis. Left ventricular ejection fraction, by estimation, is 30 to 35%. The left ventricle has moderately decreased function. The left ventricle demonstrates regional wall motion abnormalities. The left ventricular internal cavity size was moderately dilated. There is no left ventricular hypertrophy. Left ventricular diastolic parameters are indeterminate. Right Ventricle: The right ventricular size is normal. No increase in right ventricular wall thickness. Right ventricular systolic function is normal. There is normal pulmonary artery systolic pressure. The tricuspid regurgitant velocity is 2.52 m/s, and   with an assumed right atrial pressure of 3 mmHg, the estimated right ventricular systolic pressure is 03.4 mmHg. Left Atrium: Left atrial size was mildly dilated. Right Atrium: Right atrial size was normal in size. Pericardium: There is no evidence of pericardial effusion. Mitral Valve: The mitral valve is degenerative in appearance. There is severe thickening of the mitral valve leaflet(s). There is severe calcification of the mitral valve leaflet(s). Severe mitral annular calcification. Moderate to severe mitral valve regurgitation. No evidence of mitral valve stenosis. Tricuspid Valve: The tricuspid valve is normal in structure. Tricuspid valve regurgitation is mild. Aortic Valve: The aortic valve is tricuspid. Aortic valve regurgitation is mild. Severe sclerosis with no stenosis. Pulmonic Valve: The pulmonic valve was normal in structure. Pulmonic valve regurgitation is mild. Aorta: The aortic root is normal in size and structure. IAS/Shunts: No atrial level shunt detected by color flow Doppler.  LEFT VENTRICLE PLAX 2D LVIDd:         5.68 cm LVIDs:         4.44 cm LV PW:         0.90 cm LV IVS:        1.05 cm LVOT diam:     1.80 cm LV SV:         42 LV SV Index:   26 LVOT Area:     2.54 cm  LV Volumes (MOD) LV vol d, MOD A2C: 105.0 ml LV vol d, MOD A4C: 77.7 ml LV vol s, MOD A2C: 61.6 ml LV vol s, MOD A4C: 54.3 ml LV SV MOD A2C:     43.4 ml LV SV MOD A4C:     77.7 ml LV SV MOD BP:      37.9 ml RIGHT VENTRICLE RV S prime:     8.70 cm/s TAPSE (M-mode): 1.1 cm LEFT ATRIUM              Index       RIGHT ATRIUM           Index LA diam:        3.60 cm  2.21 cm/m  RA Area:     16.00 cm LA Vol (A2C):   112.0 ml 68.84 ml/m RA Volume:   44.60 ml  27.41 ml/m LA Vol (A4C):   66.0 ml  40.57 ml/m LA Biplane Vol: 87.1 ml  53.53 ml/m  AORTIC VALVE LVOT Vmax:   98.90 cm/s LVOT Vmean:  59.900 cm/s LVOT VTI:    0.164 m  AORTA Ao Root diam: 3.10 cm MITRAL VALVE                TRICUSPID VALVE MV Area (PHT): 5.38 cm     TR  Peak grad:   25.4 mmHg MV Decel Time: 141 msec     TR Vmax:        252.00 cm/s MR Peak grad: 122.8 mmHg MR Mean grad: 77.0 mmHg     SHUNTS MR  Vmax:      554.00 cm/s   Systemic VTI:  0.16 m MR Vmean:     410.0 cm/s    Systemic Diam: 1.80 cm MV E velocity: 156.67 cm/s MV A velocity: 94.70 cm/s MV E/A ratio:  1.65 Jenkins Rouge MD Electronically signed by Jenkins Rouge MD Signature Date/Time: 09/25/2019/12:28:51 PM    Final        HEAR Score (for undifferentiated chest pain):       New York Heart Association (NYHA) Functional Class NYHA Class III  Assessment and Plan:   1. CAD with stent in 2005 at [facility] [place] 06/2019 with peak troponin of 512 refused work-up, no chest pain at this time on aspirin and Lipitor and Toprol 2. cardiomyopathy EF 30-35% withdiffuse HK with mid/basal AK. flat troponins, no chest pain. Troponins 512 in April and refused workup at that time.  Still does not want work-up but would like treated with medication.  Blood pressure up today.  Start aldactone 12.5mg  daily.  3. Acute on chronic systolic CHF BNP [id number] a dose of lasix still with heart failure on exam-repeat dose today 4. HLD on lipitor 5. Dizziness felt secondary to poor oral intake treated with hydration 6. DM-diet controlled 7. CKD stage 3-Crt 0.93       For questions or updates, please contact [place] Please consult [email or web address] for contact info under    Signed, Ermalinda Barrios, PA-C  09/26/2019 8:30 AM   Attending note Patient seen and discussed with PA Bonnell Public, I agree with her documentation. 86 yo female history of CAD, HL, HTN admitted with generalized weakness, nausea, decreased oral intake.   K 4.4 Cr 1.18 BUN 16 WBC 7.4 Hgb 12 Plt 297 BNP 1614 hstrop 28-->27-->30-->29 CXR no acute process CT head no acute process EKG SR, RAD, nonspecific conduction delay, inferior and lateral TWIs 09/2019 echo LVEF 30-35%,Diffuse hypokinesis with mid and basal wall akinesis.  mod to severe MR   Patient  presents with generalized fatigue. As part of workup found to have abnormal EKG, very mild flat elevated troponin. Echo shows LVEF 30-35% with WMAs, mod to severe MR. LV dysfunction is new diagnosis for the patient. She has not had specific cardiopulmonary symptoms. Started on medical therapy this admission with toprol 25. AKI on admission resolved. Historically somewhat labile renal function, with her age Cr is deception, GFR ranges 30s to 10s consistent with CKD III. With her labile renal function and advanced age would be hesitant for ACE/ARB/ARNI. Would start low dose aldactone 12.5mg  daily.   Labile fluid status at home, would be conservative with lasix just 20mg  on Mon Wed Fri  Prior admit with elevated trop, she had turned down further workup and continues to do so today. With her advanced age and lack of cardiopulmonary symptoms.  I think her decision is very reasonable. Would avoid aggressive outpatient titration of CHF meds  Ok for discharge from cardiac standpoint, we will sign off inpatient care and arrange outpatient f/u.    Carlyle Dolly MD

## 2019-10-26 NOTE — Progress Notes (Deleted)
Cardiology Office Note    Date:  10/26/2019   ID:  Regina Good, DOB 1919-04-07, MRN [id number]  PCP:  Celene Squibb, MD  Cardiologist: Carlyle Dolly, MD EPS: None  No chief complaint on file.   History of Present Illness:  Regina Good is a 86 y.o. female with a hx of CAD stent at [facility] 2005, HTN, HLD, DM diet controlled who is being seen today for the evaluation of CHF and new cardiomyopathy    She presented to the ED with generalized weakness, headache, dizziness and nausea with poor p.o. intake. Troponins have been flat She was noted to have an abnormal EKG with ST depressions in T wave inversions in the inferior leads-reviewed by Dr. Saunders Revel who didn't feel she had STEMI. Echocardiogram performed shows EF of 30 to 35% with wall motion abnormalities. Troponin 512, declined workup.    Past Medical History:  Diagnosis Date  . Anxiety   . Diabetes mellitus   . Family history of diabetes mellitus   . GERD (gastroesophageal reflux disease)   . History of melanoma   . Hyperlipidemia   . Hypertension     Past Surgical History:  Procedure Laterality Date  . APPENDECTOMY    . CORONARY STENT PLACEMENT  2004  . NM MYOVIEW LTD  2010  . RENAL ARTERY STENT  2006    Current Medications: No outpatient medications have been marked as taking for the 10/31/19 encounter (Appointment) with Imogene Burn, PA-C.     Allergies:   Codeine and Sulfonamide derivatives   Social History   Socioeconomic History  . Marital status: Widowed    Spouse name: Not on file  . Number of children: Not on file  . Years of education: Not on file  . Highest education level: Not on file  Occupational History  . Occupation: Retired, previously a Armed forces logistics/support/administrative officer  . Smoking status: Never Smoker  . Smokeless tobacco: Never Used  Vaping Use  . Vaping Use: Never used  Substance and Sexual Activity  . Alcohol use: No  . Drug use: No  . Sexual activity: Not on file  Other Topics Concern  .  Not on file  Social History Narrative   Lives with spouse who is 45 and schizophrenic daughter   Social Determinants of Radio broadcast assistant Strain:   . Difficulty of Paying Living Expenses:   Food Insecurity:   . Worried About Charity fundraiser in the Last Year:   . Arboriculturist in the Last Year:   Transportation Needs:   . Film/video editor (Medical):   Marland Kitchen Lack of Transportation (Non-Medical):   Physical Activity:   . Days of Exercise per Week:   . Minutes of Exercise per Session:   Stress:   . Feeling of Stress :   Social Connections:   . Frequency of Communication with Friends and Family:   . Frequency of Social Gatherings with Friends and Family:   . Attends Religious Services:   . Active Member of Clubs or Organizations:   . Attends Archivist Meetings:   Marland Kitchen Marital Status:      Family History:  The patient's ***family history includes Colon cancer in her sister; Coronary artery disease in her father; Diabetes in her brother and son; Pancreatic cancer in her mother; Schizophrenia in her daughter; Stroke in her sister, sister, and son.   ROS:   Please see the history of present illness.  ROS All other systems reviewed and are negative.   PHYSICAL EXAM:   VS:  There were no vitals taken for this visit.  Physical Exam  GEN: Well nourished, well developed, in no acute distress  HEENT: normal  Neck: no JVD, carotid bruits, or masses Cardiac:RRR; no murmurs, rubs, or gallops  Respiratory:  clear to auscultation bilaterally, normal work of breathing GI: soft, nontender, nondistended, + BS Ext: without cyanosis, clubbing, or edema, Good distal pulses bilaterally MS: no deformity or atrophy  Skin: warm and dry, no rash Neuro:  Alert and Oriented x 3, Strength and sensation are intact Psych: euthymic mood, full affect  Wt Readings from Last 3 Encounters:  09/24/19 128 lb (58.1 kg)  07/13/19 130 lb (59 kg)  06/20/19 126 lb 1.7 oz (57.2 kg)       Studies/Labs Reviewed:   EKG:  EKG is*** ordered today.  The ekg ordered today demonstrates ***  Recent Labs: 09/24/2019: ALT 15; B Natriuretic Peptide 1,614.0 09/25/2019: BUN 16; Creatinine, Ser 0.93; Hemoglobin 11.5; Platelets 277; Potassium 4.3; Sodium 138   Lipid Panel    Component Value Date/Time   CHOL 192 08/24/2008 2339   TRIG 137 08/24/2008 2339   HDL 44 08/24/2008 2339   CHOLHDL 4.4 Ratio 08/24/2008 2339   VLDL 27 08/24/2008 2339   LDLCALC 121 (H) 08/24/2008 2339    Additional studies/ records that were reviewed today include:  ECHOCARDIOGRAM COMPLETE   Result Date: 09/25/2019    ECHOCARDIOGRAM REPORT   Patient Name:   Regina Good Date of Exam: 09/25/2019 Medical Rec #:  [id number]    Height:       64.5 in Accession #:    [id number]   Weight:       128.0 lb Date of Birth:  April 11, 1919   BSA:          1.627 m Patient Age:    25 years     BP:           137/94 mmHg Patient Gender: F            HR:           102 bpm. Exam Location:  [facility] Procedure: 2D Echo, Cardiac Doppler and Color Doppler Indications:    Abnormal ECG 794.31 / R94.31  History:        Patient has no prior history of Echocardiogram examinations. CAD                 and Previous Myocardial Infarction; Risk Factors:Dyslipidemia,                 Diabetes and Hypertension. CHRONIC KIDNEY DISEASE STAGE III                 (MODERATE), GERD.  Sonographer:    Alvino Chapel RCS Referring Phys: Danielson  1. Diffuse hypokinesis with mid and basal wall akinesis . Left ventricular ejection fraction, by estimation, is 30 to 35%. The left ventricle has moderately decreased function. The left ventricle demonstrates regional wall motion abnormalities (see scoring diagram/findings for description). The left ventricular internal cavity size was moderately dilated. Left ventricular diastolic parameters are indeterminate.  2. Right ventricular systolic function is normal. The right ventricular size is normal.  There is normal pulmonary artery systolic pressure.  3. Left atrial size was mildly dilated.  4. The mitral valve is degenerative. Moderate to severe mitral valve regurgitation. No evidence of mitral stenosis.  5. The aortic valve  is tricuspid. Aortic valve regurgitation is mild. severe sclerosis with no stenosis. FINDINGS  Left Ventricle: Diffuse hypokinesis with mid and basal wall akinesis. Left ventricular ejection fraction, by estimation, is 30 to 35%. The left ventricle has moderately decreased function. The left ventricle demonstrates regional wall motion abnormalities. The left ventricular internal cavity size was moderately dilated. There is no left ventricular hypertrophy. Left ventricular diastolic parameters are indeterminate. Right Ventricle: The right ventricular size is normal. No increase in right ventricular wall thickness. Right ventricular systolic function is normal. There is normal pulmonary artery systolic pressure. The tricuspid regurgitant velocity is 2.52 m/s, and  with an assumed right atrial pressure of 3 mmHg, the estimated right ventricular systolic pressure is 08.1 mmHg. Left Atrium: Left atrial size was mildly dilated. Right Atrium: Right atrial size was normal in size. Pericardium: There is no evidence of pericardial effusion. Mitral Valve: The mitral valve is degenerative in appearance. There is severe thickening of the mitral valve leaflet(s). There is severe calcification of the mitral valve leaflet(s). Severe mitral annular calcification. Moderate to severe mitral valve regurgitation. No evidence of mitral valve stenosis. Tricuspid Valve: The tricuspid valve is normal in structure. Tricuspid valve regurgitation is mild. Aortic Valve: The aortic valve is tricuspid. Aortic valve regurgitation is mild. Severe sclerosis with no stenosis. Pulmonic Valve: The pulmonic valve was normal in structure. Pulmonic valve regurgitation is mild. Aorta: The aortic root is normal in size and  structure. IAS/Shunts: No atrial level shunt detected by color flow Doppler.  LEFT VENTRICLE PLAX 2D LVIDd:         5.68 cm LVIDs:         4.44 cm LV PW:         0.90 cm LV IVS:        1.05 cm LVOT diam:     1.80 cm LV SV:         42 LV SV Index:   26 LVOT Area:     2.54 cm  LV Volumes (MOD) LV vol d, MOD A2C: 105.0 ml LV vol d, MOD A4C: 77.7 ml LV vol s, MOD A2C: 61.6 ml LV vol s, MOD A4C: 54.3 ml LV SV MOD A2C:     43.4 ml LV SV MOD A4C:     77.7 ml LV SV MOD BP:      37.9 ml RIGHT VENTRICLE RV S prime:     8.70 cm/s TAPSE (M-mode): 1.1 cm LEFT ATRIUM              Index       RIGHT ATRIUM           Index LA diam:        3.60 cm  2.21 cm/m  RA Area:     16.00 cm LA Vol (A2C):   112.0 ml 68.84 ml/m RA Volume:   44.60 ml  27.41 ml/m LA Vol (A4C):   66.0 ml  40.57 ml/m LA Biplane Vol: 87.1 ml  53.53 ml/m  AORTIC VALVE LVOT Vmax:   98.90 cm/s LVOT Vmean:  59.900 cm/s LVOT VTI:    0.164 m  AORTA Ao Root diam: 3.10 cm MITRAL VALVE                TRICUSPID VALVE MV Area (PHT): 5.38 cm     TR Peak grad:   25.4 mmHg MV Decel Time: 141 msec     TR Vmax:        252.00 cm/s MR Peak grad:  122.8 mmHg MR Mean grad: 77.0 mmHg     SHUNTS MR Vmax:      554.00 cm/s   Systemic VTI:  0.16 m MR Vmean:     410.0 cm/s    Systemic Diam: 1.80 cm MV E velocity: 156.67 cm/s MV A velocity: 94.70 cm/s MV E/A ratio:  1.65 Jenkins Rouge MD Electronically signed by Jenkins Rouge MD Signature Date/Time: 09/25/2019/12:28:51 PM    Final              ASSESSMENT:    No diagnosis found.   PLAN:  In order of problems listed above:   1. CAD with stent in 2005 at [facility] [place] 06/2019 with peak troponin of 512 refused work-up, on aspirin and Lipitor and Toprol 2. cardiomyopathy EF 30-35% withdiffuse HK with mid/basal AK. flat troponins, no chest pain. Troponins 512 in April and refused workup at that time.  Still does not want work-up but would like treated with medication.    Start aldactone 12.5mg  daily.  3. chronic systolic CHF BNP  1610 4. HLD on lipitor 5. Dizziness felt secondary to poor oral intake treated with hydration 6. DM-diet controlled 7. CKD stage 3-Crt 0.93      Medication Adjustments/Labs and Tests Ordered: Current medicines are reviewed at length with the patient today.  Concerns regarding medicines are outlined above.  Medication changes, Labs and Tests ordered today are listed in the Patient Instructions below. There are no Patient Instructions on file for this visit.   Sumner Boast, PA-C  10/26/2019 2:46 PM    [facility] [place], [place], [place]  [address] Phone: [phone]; Fax: [phone]

## 2019-10-29 ENCOUNTER — Other Ambulatory Visit: Payer: Self-pay

## 2019-10-29 ENCOUNTER — Emergency Department (HOSPITAL_COMMUNITY)
Admission: EM | Admit: 2019-10-29 | Discharge: 2019-10-29 | Disposition: A | Discharge status: Home / Self Care | Payer: Medicare Other | Attending: Emergency Medicine | Admitting: Emergency Medicine

## 2019-10-29 ENCOUNTER — Encounter (HOSPITAL_COMMUNITY): Payer: Self-pay | Admitting: Emergency Medicine

## 2019-10-29 DIAGNOSIS — R300 Dysuria: Secondary | ICD-10-CM | POA: Diagnosis present

## 2019-10-29 DIAGNOSIS — Z5321 Procedure and treatment not carried out due to patient leaving prior to being seen by health care provider: Secondary | ICD-10-CM | POA: Insufficient documentation

## 2019-10-29 NOTE — ED Triage Notes (Signed)
Pt states she woke up at 0200 to urinate and states " I could only dribbled and I go all the time and it burns"

## 2019-10-31 ENCOUNTER — Ambulatory Visit: Payer: Medicare Other | Admitting: Physician Assistant

## 2021-01-07 DIAGNOSIS — Z23 Encounter for immunization: Secondary | ICD-10-CM | POA: Diagnosis not present

## 2021-01-07 DIAGNOSIS — E785 Hyperlipidemia, unspecified: Secondary | ICD-10-CM | POA: Diagnosis not present

## 2021-01-07 DIAGNOSIS — I509 Heart failure, unspecified: Secondary | ICD-10-CM | POA: Diagnosis not present

## 2021-01-07 DIAGNOSIS — K219 Gastro-esophageal reflux disease without esophagitis: Secondary | ICD-10-CM | POA: Diagnosis not present

## 2021-02-12 DIAGNOSIS — J069 Acute upper respiratory infection, unspecified: Secondary | ICD-10-CM | POA: Diagnosis not present

## 2021-02-26 DIAGNOSIS — L7621 Postprocedural hemorrhage and hematoma of skin and subcutaneous tissue following a dermatologic procedure: Secondary | ICD-10-CM | POA: Diagnosis not present

## 2021-02-26 DIAGNOSIS — L570 Actinic keratosis: Secondary | ICD-10-CM | POA: Diagnosis not present

## 2021-02-26 DIAGNOSIS — C44729 Squamous cell carcinoma of skin of left lower limb, including hip: Secondary | ICD-10-CM | POA: Diagnosis not present

## 2021-02-26 DIAGNOSIS — X32XXXD Exposure to sunlight, subsequent encounter: Secondary | ICD-10-CM | POA: Diagnosis not present

## 2021-03-25 DIAGNOSIS — Z85828 Personal history of other malignant neoplasm of skin: Secondary | ICD-10-CM | POA: Diagnosis not present

## 2021-03-25 DIAGNOSIS — L928 Other granulomatous disorders of the skin and subcutaneous tissue: Secondary | ICD-10-CM | POA: Diagnosis not present

## 2021-03-25 DIAGNOSIS — Z08 Encounter for follow-up examination after completed treatment for malignant neoplasm: Secondary | ICD-10-CM | POA: Diagnosis not present

## 2021-03-25 DIAGNOSIS — Z85820 Personal history of malignant melanoma of skin: Secondary | ICD-10-CM | POA: Diagnosis not present

## 2021-05-01 ENCOUNTER — Other Ambulatory Visit (HOSPITAL_COMMUNITY): Payer: Medicare Other

## 2021-05-01 ENCOUNTER — Other Ambulatory Visit: Payer: Self-pay

## 2021-05-01 ENCOUNTER — Observation Stay (HOSPITAL_BASED_OUTPATIENT_CLINIC_OR_DEPARTMENT_OTHER): Payer: Medicare Other

## 2021-05-01 ENCOUNTER — Observation Stay (HOSPITAL_COMMUNITY)
Admission: EM | Admit: 2021-05-01 | Discharge: 2021-05-02 | Disposition: A | Discharge status: Home / Self Care | Payer: Medicare Other | Attending: Internal Medicine | Admitting: Internal Medicine

## 2021-05-01 ENCOUNTER — Encounter (HOSPITAL_COMMUNITY): Payer: Self-pay

## 2021-05-01 ENCOUNTER — Emergency Department (HOSPITAL_COMMUNITY): Payer: Medicare Other

## 2021-05-01 DIAGNOSIS — I2510 Atherosclerotic heart disease of native coronary artery without angina pectoris: Secondary | ICD-10-CM | POA: Insufficient documentation

## 2021-05-01 DIAGNOSIS — R079 Chest pain, unspecified: Secondary | ICD-10-CM | POA: Diagnosis not present

## 2021-05-01 DIAGNOSIS — I10 Essential (primary) hypertension: Secondary | ICD-10-CM | POA: Diagnosis not present

## 2021-05-01 DIAGNOSIS — I252 Old myocardial infarction: Secondary | ICD-10-CM | POA: Insufficient documentation

## 2021-05-01 DIAGNOSIS — N183 Chronic kidney disease, stage 3 unspecified: Secondary | ICD-10-CM | POA: Insufficient documentation

## 2021-05-01 DIAGNOSIS — I200 Unstable angina: Secondary | ICD-10-CM | POA: Diagnosis not present

## 2021-05-01 DIAGNOSIS — E7800 Pure hypercholesterolemia, unspecified: Secondary | ICD-10-CM | POA: Diagnosis not present

## 2021-05-01 DIAGNOSIS — I214 Non-ST elevation (NSTEMI) myocardial infarction: Secondary | ICD-10-CM | POA: Diagnosis not present

## 2021-05-01 DIAGNOSIS — Z20822 Contact with and (suspected) exposure to covid-19: Secondary | ICD-10-CM | POA: Diagnosis not present

## 2021-05-01 DIAGNOSIS — F411 Generalized anxiety disorder: Secondary | ICD-10-CM

## 2021-05-01 DIAGNOSIS — I447 Left bundle-branch block, unspecified: Secondary | ICD-10-CM | POA: Diagnosis not present

## 2021-05-01 DIAGNOSIS — Z7982 Long term (current) use of aspirin: Secondary | ICD-10-CM | POA: Insufficient documentation

## 2021-05-01 DIAGNOSIS — E1122 Type 2 diabetes mellitus with diabetic chronic kidney disease: Secondary | ICD-10-CM | POA: Insufficient documentation

## 2021-05-01 DIAGNOSIS — E785 Hyperlipidemia, unspecified: Secondary | ICD-10-CM | POA: Insufficient documentation

## 2021-05-01 DIAGNOSIS — I501 Left ventricular failure: Secondary | ICD-10-CM | POA: Diagnosis not present

## 2021-05-01 DIAGNOSIS — I129 Hypertensive chronic kidney disease with stage 1 through stage 4 chronic kidney disease, or unspecified chronic kidney disease: Secondary | ICD-10-CM | POA: Diagnosis not present

## 2021-05-01 DIAGNOSIS — Z79899 Other long term (current) drug therapy: Secondary | ICD-10-CM | POA: Diagnosis not present

## 2021-05-01 DIAGNOSIS — R6889 Other general symptoms and signs: Secondary | ICD-10-CM | POA: Diagnosis not present

## 2021-05-01 DIAGNOSIS — I499 Cardiac arrhythmia, unspecified: Secondary | ICD-10-CM | POA: Diagnosis not present

## 2021-05-01 DIAGNOSIS — K219 Gastro-esophageal reflux disease without esophagitis: Secondary | ICD-10-CM | POA: Insufficient documentation

## 2021-05-01 DIAGNOSIS — Z743 Need for continuous supervision: Secondary | ICD-10-CM | POA: Diagnosis not present

## 2021-05-01 HISTORY — DX: Atherosclerotic heart disease of native coronary artery without angina pectoris: I25.10

## 2021-05-01 LAB — PHOSPHORUS: Phosphorus: 3 mg/dL (ref 2.5–4.6)

## 2021-05-01 LAB — COMPREHENSIVE METABOLIC PANEL
ALT: 19 U/L (ref 0–44)
AST: 22 U/L (ref 15–41)
Albumin: 4.1 g/dL (ref 3.5–5.0)
Alkaline Phosphatase: 78 U/L (ref 38–126)
Anion gap: 15 (ref 5–15)
BUN: 30 mg/dL — ABNORMAL HIGH (ref 8–23)
CO2: 24 mmol/L (ref 22–32)
Calcium: 9.3 mg/dL (ref 8.9–10.3)
Chloride: 97 mmol/L — ABNORMAL LOW (ref 98–111)
Creatinine, Ser: 1.21 mg/dL — ABNORMAL HIGH (ref 0.44–1.00)
GFR, Estimated: 40 mL/min — ABNORMAL LOW (ref >60)
Glucose, Bld: 250 mg/dL — ABNORMAL HIGH (ref 70–99)
Potassium: 3.8 mmol/L (ref 3.5–5.1)
Sodium: 136 mmol/L (ref 135–145)
Total Bilirubin: 0.7 mg/dL (ref 0.3–1.2)
Total Protein: 7.7 g/dL (ref 6.5–8.1)

## 2021-05-01 LAB — ECHOCARDIOGRAM LIMITED
Area-P 1/2: 3.08 cm2
Height: 65 in
S' Lateral: 3.8 cm
Weight: 1680 oz

## 2021-05-01 LAB — RESP PANEL BY RT-PCR (FLU A&B, COVID) ARPGX2
Influenza A by PCR: NEGATIVE
Influenza B by PCR: NEGATIVE
SARS Coronavirus 2 by RT PCR: NEGATIVE

## 2021-05-01 LAB — HEMOGLOBIN A1C
Hgb A1c MFr Bld: 6.4 % — ABNORMAL HIGH (ref 4.8–5.6)
Mean Plasma Glucose: 136.98 mg/dL

## 2021-05-01 LAB — CBC WITH DIFFERENTIAL/PLATELET
Abs Immature Granulocytes: 0.06 10*3/uL (ref 0.00–0.07)
Basophils Absolute: 0 10*3/uL (ref 0.0–0.1)
Basophils Relative: 0 %
Eosinophils Absolute: 0.1 10*3/uL (ref 0.0–0.5)
Eosinophils Relative: 1 %
HCT: 41.7 % (ref 36.0–46.0)
Hemoglobin: 12.6 g/dL (ref 12.0–15.0)
Immature Granulocytes: 1 %
Lymphocytes Relative: 12 %
Lymphs Abs: 1.4 10*3/uL (ref 0.7–4.0)
MCH: 27.3 pg (ref 26.0–34.0)
MCHC: 30.2 g/dL (ref 30.0–36.0)
MCV: 90.5 fL (ref 80.0–100.0)
Monocytes Absolute: 0.8 10*3/uL (ref 0.1–1.0)
Monocytes Relative: 7 %
Neutro Abs: 9.3 10*3/uL — ABNORMAL HIGH (ref 1.7–7.7)
Neutrophils Relative %: 79 %
Platelets: 267 10*3/uL (ref 150–400)
RBC: 4.61 MIL/uL (ref 3.87–5.11)
RDW: 13.4 % (ref 11.5–15.5)
WBC: 11.7 10*3/uL — ABNORMAL HIGH (ref 4.0–10.5)
nRBC: 0 % (ref 0.0–0.2)

## 2021-05-01 LAB — TROPONIN I (HIGH SENSITIVITY)
Troponin I (High Sensitivity): 67 ng/L — ABNORMAL HIGH (ref <18)
Troponin I (High Sensitivity): 77 ng/L — ABNORMAL HIGH (ref <18)
Troponin I (High Sensitivity): 81 ng/L — ABNORMAL HIGH
Troponin I (High Sensitivity): 68 ng/L — ABNORMAL HIGH
Troponin I (High Sensitivity): 59 ng/L — ABNORMAL HIGH (ref <18)
Troponin I (High Sensitivity): 59 ng/L — ABNORMAL HIGH (ref <18)

## 2021-05-01 LAB — LIPID PANEL
Cholesterol: 121 mg/dL (ref 0–200)
HDL: 50 mg/dL (ref >40)
LDL Cholesterol: 62 mg/dL (ref 0–99)
Total CHOL/HDL Ratio: 2.4 RATIO
Triglycerides: 47 mg/dL (ref <150)
VLDL: 9 mg/dL (ref 0–40)

## 2021-05-01 LAB — MAGNESIUM: Magnesium: 1.6 mg/dL — ABNORMAL LOW (ref 1.7–2.4)

## 2021-05-01 LAB — GLUCOSE, CAPILLARY
Glucose-Capillary: 257 mg/dL — ABNORMAL HIGH (ref 70–99)
Glucose-Capillary: 117 mg/dL — ABNORMAL HIGH (ref 70–99)

## 2021-05-01 LAB — HEPARIN LEVEL (UNFRACTIONATED): Heparin Unfractionated: 0.29 IU/mL — ABNORMAL LOW (ref 0.30–0.70)

## 2021-05-01 LAB — LIPASE, BLOOD: Lipase: 44 U/L (ref 11–51)

## 2021-05-01 MED ORDER — ATORVASTATIN CALCIUM 20 MG PO TABS
20.0000 mg | ORAL_TABLET | Freq: Every day | ORAL | Status: DC
  Administered 2021-05-01 – 2021-05-02 (×2): 20 mg via ORAL
  Filled 2021-05-01 (×2): qty 1

## 2021-05-01 MED ORDER — PERFLUTREN LIPID MICROSPHERE
1.0000 mL | INTRAVENOUS | Status: AC | PRN
  Administered 2021-05-01: 2 mL via INTRAVENOUS
  Filled 2021-05-01: qty 10

## 2021-05-01 MED ORDER — ONDANSETRON HCL 4 MG/2ML IJ SOLN: 4.0000 mg | Freq: Four times a day (QID) | INTRAMUSCULAR | Status: DC | PRN

## 2021-05-01 MED ORDER — INSULIN ASPART 100 UNIT/ML IJ SOLN
0.0000 [IU] | INTRAMUSCULAR | Status: DC
  Administered 2021-05-01: 5 [IU] via SUBCUTANEOUS
  Administered 2021-05-02: 2 [IU] via SUBCUTANEOUS

## 2021-05-01 MED ORDER — ACETAMINOPHEN 325 MG PO TABS: 650.0000 mg | ORAL_TABLET | ORAL | Status: DC | PRN

## 2021-05-01 MED ORDER — HEPARIN BOLUS VIA INFUSION
2800.0000 [IU] | Freq: Once | INTRAVENOUS | Status: AC
  Administered 2021-05-01: 2800 [IU] via INTRAVENOUS

## 2021-05-01 MED ORDER — ALPRAZOLAM 0.25 MG PO TABS
0.2500 mg | ORAL_TABLET | Freq: Every day | ORAL | Status: DC
  Administered 2021-05-01: 0.25 mg via ORAL
  Filled 2021-05-01: qty 1

## 2021-05-01 MED ORDER — ASPIRIN EC 81 MG PO TBEC
81.0000 mg | DELAYED_RELEASE_TABLET | Freq: Every day | ORAL | Status: DC
  Administered 2021-05-02: 81 mg via ORAL
  Filled 2021-05-01: qty 1

## 2021-05-01 MED ORDER — HEPARIN (PORCINE) 25000 UT/250ML-% IV SOLN
650.0000 [IU]/h | INTRAVENOUS | Status: DC
  Administered 2021-05-01: 16:00:00 600 [IU]/h via INTRAVENOUS
  Filled 2021-05-01: qty 250

## 2021-05-01 MED ORDER — SUCRALFATE 1 G PO TABS
1.0000 g | ORAL_TABLET | Freq: Three times a day (TID) | ORAL | Status: DC
  Administered 2021-05-01 – 2021-05-02 (×2): 1 g via ORAL
  Filled 2021-05-01 (×2): qty 1

## 2021-05-01 MED ORDER — BISMUTH SUBSALICYLATE 262 MG/15ML PO SUSP
30.0000 mL | Freq: Four times a day (QID) | ORAL | Status: DC | PRN
  Filled 2021-05-01: qty 118

## 2021-05-01 MED ORDER — SODIUM CHLORIDE 0.9 % IV BOLUS
500.0000 mL | Freq: Once | INTRAVENOUS | Status: AC
  Administered 2021-05-01: 500 mL via INTRAVENOUS

## 2021-05-01 MED ORDER — ASPIRIN 81 MG PO CHEW
324.0000 mg | CHEWABLE_TABLET | Freq: Once | ORAL | Status: AC
  Administered 2021-05-01: 324 mg via ORAL
  Filled 2021-05-01: qty 4

## 2021-05-01 MED ORDER — ACETAMINOPHEN 325 MG PO TABS: 650.0000 mg | ORAL_TABLET | Freq: Four times a day (QID) | ORAL | Status: DC | PRN

## 2021-05-01 NOTE — ED Provider Notes (Signed)
[facility] Provider Note   CSN: [id number] Arrival date & time: 05/01/21  1030     History  Chief Complaint  Patient presents with   Chest Pain    Regina Good is a 88 y.o. female.  HPI 88 year old female presents with severe chest pain.  At around 2 AM she had severe chest pain that felt like she needed to burp.  It was a heaviness/pressure in her chest.  She took multiple meds including things prescribed by her PCP she does remember the name of in addition to Pepto-Bismol and Kaopectate.  At around 4 AM the symptoms went away.  Symptoms are still gone but when she told family about it they called 911 and she came here to get evaluated.  She states it felt like the pain was worse than the 2 prior heart attacks she had but also different in the nature.  She denies any current abdominal pain.  No shortness of breath.  No vomiting.  Home Medications Prior to Admission medications   Medication Sig Start Date End Date Taking? Authorizing Provider  acetaminophen (TYLENOL) 325 MG tablet Take 2 tablets (650 mg total) by mouth every 6 (six) hours as needed for mild pain (or Fever >/= 101). 09/26/19   Emokpae, Courage, MD  ALPRAZolam (XANAX) 0.25 MG tablet Take 1 tablet (0.25 mg total) by mouth at bedtime. 09/26/19   Roxan Hockey, MD  aspirin 81 MG chewable tablet Chew 1 tablet (81 mg total) by mouth daily with breakfast. 09/26/19   Denton Brick, Courage, MD  atorvastatin (LIPITOR) 20 MG tablet Take 1 tablet (20 mg total) by mouth daily. 09/26/19   Roxan Hockey, MD  furosemide (LASIX) 40 MG tablet Take 1 tablet (40 mg total) by mouth every Monday, Wednesday, and Friday. 09/28/19   Roxan Hockey, MD  metoprolol succinate (TOPROL-XL) 25 MG 24 hr tablet Take 1 tablet (25 mg total) by mouth daily. 09/27/19   Roxan Hockey, MD  omeprazole (PRILOSEC) 20 MG capsule Take 1 capsule (20 mg total) by mouth daily. 09/26/19   Roxan Hockey, MD  ondansetron (ZOFRAN) 4 MG tablet Take  1 tablet (4 mg total) by mouth every 6 (six) hours as needed for nausea. 09/26/19   Roxan Hockey, MD  spironolactone (ALDACTONE) 25 MG tablet Take 0.5 tablets (12.5 mg total) by mouth daily. 09/27/19   Roxan Hockey, MD      Allergies    Codeine and Sulfonamide derivatives    Review of Systems   Review of Systems  Constitutional:  Negative for fever.  Respiratory:  Negative for shortness of breath.   Cardiovascular:  Positive for chest pain.  Gastrointestinal:  Positive for nausea. Negative for abdominal pain and vomiting.  Musculoskeletal:  Negative for back pain.   Physical Exam Updated Vital Signs BP (!) 142/71    Pulse 73    Temp 98.9 F (37.2 C)    Resp (!) 21    Ht 5\' 5"  (1.651 m)    Wt 47.6 kg    SpO2 96%    BMI 17.47 kg/m  Physical Exam Vitals and nursing note reviewed.  Constitutional:      General: She is not in acute distress.    Appearance: She is well-developed. She is not ill-appearing or diaphoretic.  HENT:     Head: Normocephalic and atraumatic.  Cardiovascular:     Rate and Rhythm: Normal rate and regular rhythm.     Pulses:  Radial pulses are 2+ on the right side and 2+ on the left side.     Heart sounds: Normal heart sounds.  Pulmonary:     Effort: Pulmonary effort is normal.     Breath sounds: Normal breath sounds.  Abdominal:     Palpations: Abdomen is soft.     Tenderness: There is no abdominal tenderness.  Skin:    General: Skin is warm and dry.  Neurological:     Mental Status: She is alert.    ED Results / Procedures / Treatments   Labs (all labs ordered are listed, but only abnormal results are displayed) Labs Reviewed  COMPREHENSIVE METABOLIC PANEL - Abnormal; Notable for the following components:      Result Value   Chloride 97 (*)    Glucose, Bld 250 (*)    BUN 30 (*)    Creatinine, Ser 1.21 (*)    GFR, Estimated 40 (*)    All other components within normal limits  CBC WITH DIFFERENTIAL/PLATELET - Abnormal; Notable for  the following components:   WBC 11.7 (*)    Neutro Abs 9.3 (*)    All other components within normal limits  TROPONIN I (HIGH SENSITIVITY) - Abnormal; Notable for the following components:   Troponin I (High Sensitivity) 67 (*)    All other components within normal limits  TROPONIN I (HIGH SENSITIVITY) - Abnormal; Notable for the following components:   Troponin I (High Sensitivity) 77 (*)    All other components within normal limits  RESP PANEL BY RT-PCR (FLU A&B, COVID) ARPGX2  LIPASE, BLOOD  TROPONIN I (HIGH SENSITIVITY)    EKG EKG Interpretation  Date/Time:  Wednesday May 01 2021 10:40:19 EST Ventricular Rate:  87 PR Interval:  162 QRS Duration: 160 QT Interval:  450 QTC Calculation: 542 R Axis:   -54 Text Interpretation: Sinus rhythm Left bundle branch block Confirmed by Sherwood Gambler [phone]) on 05/01/2021 10:57:23 AM  Radiology DG Chest 2 View  Result Date: 05/01/2021 CLINICAL DATA:  Chest pain EXAM: CHEST - 2 VIEW COMPARISON:  09/24/2019 FINDINGS: Mild cardiac enlargement without heart failure. Atherosclerotic aortic arch. Lungs clear without infiltrate or effusion. No acute skeletal abnormality. IMPRESSION: No active cardiopulmonary disease. Electronically Signed   By: Franchot Gallo M.D.   On: 05/01/2021 12:16    Procedures .Critical Care Performed by: Sherwood Gambler, MD Authorized by: Sherwood Gambler, MD   Critical care provider statement:    Critical care time (minutes):  30   Critical care time was exclusive of:  Separately billable procedures and treating other patients   Critical care was necessary to treat or prevent imminent or life-threatening deterioration of the following conditions:  Cardiac failure   Critical care was time spent personally by me on the following activities:  Development of treatment plan with patient or surrogate, discussions with consultants, evaluation of patient's response to treatment, examination of patient, ordering and  review of laboratory studies, ordering and review of radiographic studies, ordering and performing treatments and interventions, pulse oximetry, re-evaluation of patient's condition and review of old charts    Medications Ordered in ED Medications  aspirin chewable tablet 324 mg (324 mg Oral Given 05/01/21 1142)  sodium chloride 0.9 % bolus 500 mL (0 mLs Intravenous Stopped 05/01/21 1444)    ED Course/ Medical Decision Making/ A&P                           Medical Decision Making Amount  and/or Complexity of Data Reviewed Labs: ordered. Radiology: ordered.  Risk OTC drugs. Decision regarding hospitalization.   Patient is asymptomatic at this point.  Her symptoms sound like it could have been GI related in her chest but at the same time she has not a significant CAD history.  Chart review shows she had chest pain back in 2021 and was admitted and her EF was around 35-40% with some wall motion abnormalities.  At that time she chose not to do anything else or pursue interventions.  At this point, she remains chest pain-free but her troponins are more elevated than before and slightly rising.  I discussed with Dr. Harl Bowie, who recommends heparin and admission for echo and further work-up.  Patient is agreeing to this.  I have discussed with Dr. Sherral Hammers who will admit.  Chest x-ray images viewed by myself and show no obvious pneumonia or CHF.  ECG shows a left bundle branch block but no overt ischemia on my read.  Labs otherwise benign besides a mild bump in her creatinine.         Final Clinical Impression(s) / ED Diagnoses Final diagnoses:  NSTEMI (non-ST elevated myocardial infarction) [facility]    Rx / DC Orders ED Discharge Orders     None         Sherwood Gambler, MD 05/01/21 1458

## 2021-05-01 NOTE — Progress Notes (Signed)
*  PRELIMINARY RESULTS* Echocardiogram 2D Echocardiogram has been performed with [facility].  Samuel Germany 05/01/2021, 4:14 PM

## 2021-05-01 NOTE — Progress Notes (Signed)
ANTICOAGULATION CONSULT NOTE - Initial Consult  Pharmacy Consult for Heparin Indication: chest pain/ACS  Allergies  Allergen Reactions   Codeine Other (See Comments)    Sensation of being out of this world.   Sulfonamide Derivatives Other (See Comments)    Makes her feel like dreaming while awake.    Patient Measurements: Height: 5\' 5"  (165.1 cm) Weight: 47.6 kg (105 lb) IBW/kg (Calculated) : 57 HEPARIN DW (KG): 47.6   Vital Signs: Temp: 98.9 F (37.2 C) (02/15 1040) BP: 142/71 (02/15 1441) Pulse Rate: 73 (02/15 1441)  Labs: Recent Labs    05/01/21 1100 05/01/21 1311  HGB 12.6  --   HCT 41.7  --   PLT 267  --   CREATININE 1.21*  --   TROPONINIHS 67* 77*    Estimated Creatinine Clearance: 19 mL/min (A) (by C-G formula based on SCr of 1.21 mg/dL (H)).   Medical History: Past Medical History:  Diagnosis Date   Anxiety    Diabetes mellitus    Family history of diabetes mellitus    GERD (gastroesophageal reflux disease)    History of melanoma    Hyperlipidemia    Hypertension     Medications:  See med rec  Assessment: 88 yo Patient presents to ED with chest pain. No SOB. She said it felt like the pain she had with 2 prior heart attacks. She is not on oral anticoagulation at home. Pharmacy asked to start heparin  Goal of Therapy:  Heparin level 0.3-0.7 units/ml Monitor platelets by anticoagulation protocol: Yes   Plan:  Give 2800 units bolus x 1 Start heparin infusion at 600 units/hr Check anti-Xa level in ~8 hours and daily while on heparin Continue to monitor H&H and platelets  Isac Sarna, BS Vena Austria, BCPS Clinical Pharmacist Pager [phone] 05/01/2021,2:51 PM

## 2021-05-01 NOTE — H&P (Signed)
[facility] History and Physical  Regina Good LPF:[id number] DOB: 22-Dec-1919 DOA: 05/01/2021  Referring physician:  PCP: Celene Squibb, MD   Chief Complaint: Chest pain  HPI: Regina Good is a 88 y.o. WF PMHx Anxiety, DM Type II, CAD with stent placement, Essential HTN, HLD, patient states she has not had any pain the remainder of the day, negative SOB.  Patient also states she does not care what the echocardiogram study shows she is not having any procedures performed at her age.    Review of Systems:  Covid vaccination;  Constitutional:  No weight loss, night sweats, Fevers, chills, fatigue.  HEENT:  No headaches, Difficulty swallowing,Tooth/dental problems,Sore throat,  No sneezing, itching, ear ache, nasal congestion, post nasal drip,  Cardio-vascular:  No chest pain, Orthopnea, PND, swelling in lower extremities, anasarca, dizziness, palpitations  GI:  No heartburn, indigestion, abdominal pain, nausea, vomiting, diarrhea, change in bowel habits, loss of appetite  Resp:  No shortness of breath with exertion or at rest. No excess mucus, no productive cough, No non-productive cough, No coughing up of blood.No change in color of mucus.No wheezing.No chest wall deformity  Skin:  no rash or lesions.  GU:  no dysuria, change in color of urine, no urgency or frequency. No flank pain.  Musculoskeletal:  No joint pain or swelling. No decreased range of motion. No back pain.  Psych:  No change in mood or affect. No depression or anxiety. No memory loss.   Past Medical History:  Diagnosis Date   Anxiety    CAD (coronary artery disease), native coronary artery    Diabetes mellitus    Family history of diabetes mellitus    GERD (gastroesophageal reflux disease)    History of melanoma    Hyperlipidemia    Hypertension    Past Surgical History:  Procedure Laterality Date   APPENDECTOMY     CORONARY STENT PLACEMENT  2004   NM MYOVIEW LTD  2010   RENAL ARTERY STENT   2006   Social History:  reports that she has never smoked. She has never used smokeless tobacco. She reports that she does not drink alcohol and does not use drugs.  Allergies  Allergen Reactions   Codeine Other (See Comments)    Sensation of being out of this world.   Sulfonamide Derivatives Other (See Comments)    Makes her feel like dreaming while awake.    Family History  Problem Relation Age of Onset   Pancreatic cancer Mother    Coronary artery disease Father    Diabetes Brother    Colon cancer Sister    Stroke Sister    Stroke Sister    Diabetes Son    Stroke Son    Schizophrenia Daughter      Prior to Admission medications   Medication Sig Start Date End Date Taking? Authorizing Provider  acetaminophen (TYLENOL) 325 MG tablet Take 2 tablets (650 mg total) by mouth every 6 (six) hours as needed for mild pain (or Fever >/= 101). 09/26/19   Emokpae, Courage, MD  ALPRAZolam (XANAX) 0.25 MG tablet Take 1 tablet (0.25 mg total) by mouth at bedtime. 09/26/19   Roxan Hockey, MD  aspirin 81 MG chewable tablet Chew 1 tablet (81 mg total) by mouth daily with breakfast. 09/26/19   Denton Brick, Courage, MD  atorvastatin (LIPITOR) 20 MG tablet Take 1 tablet (20 mg total) by mouth daily. 09/26/19   Roxan Hockey, MD  furosemide (LASIX) 40 MG tablet Take 1 tablet (  40 mg total) by mouth every Monday, Wednesday, and Friday. 09/28/19   Roxan Hockey, MD  metoprolol succinate (TOPROL-XL) 25 MG 24 hr tablet Take 1 tablet (25 mg total) by mouth daily. 09/27/19   Roxan Hockey, MD  omeprazole (PRILOSEC) 20 MG capsule Take 1 capsule (20 mg total) by mouth daily. 09/26/19   Roxan Hockey, MD  ondansetron (ZOFRAN) 4 MG tablet Take 1 tablet (4 mg total) by mouth every 6 (six) hours as needed for nausea. 09/26/19   Roxan Hockey, MD  spironolactone (ALDACTONE) 25 MG tablet Take 0.5 tablets (12.5 mg total) by mouth daily. 09/27/19   Roxan Hockey, MD     Consultants:  Cardiology Dr.  Harl Bowie  Procedures/Significant Events:    I have personally reviewed and interpreted all radiology studies and my findings are as above.   VENTILATOR SETTINGS:    Cultures   Antimicrobials:    Devices    LINES / TUBES:      Continuous Infusions:  heparin 600 Units/hr (05/01/21 1535)    Physical Exam: Vitals:   05/01/21 1530 05/01/21 1630 05/01/21 1730 05/01/21 1800  BP: (!) 155/63 (!) 147/61 (!) 145/65 (!) 143/63  Pulse: 69   80  Resp: 13   16  Temp:      SpO2: 91%   92%  Weight:      Height:        Wt Readings from Last 3 Encounters:  05/01/21 47.6 kg  10/29/19 58.1 kg  09/24/19 58.1 kg    General: A/O x4 No acute respiratory distress Eyes: negative scleral hemorrhage, negative anisocoria, negative icterus ENT: Negative Runny nose, negative gingival bleeding, Neck:  Negative scars, masses, torticollis, lymphadenopathy, JVD Lungs: Clear to auscultation bilaterally without wheezes or crackles Cardiovascular: Regular rate and rhythm without murmur gallop or rub normal S1 and S2 Abdomen: negative abdominal pain, nondistended, positive soft, bowel sounds, no rebound, no ascites, no appreciable mass Extremities: No significant cyanosis, clubbing, or edema bilateral lower extremities Skin: Negative rashes, lesions, ulcers Psychiatric:  Negative depression, negative anxiety, negative fatigue, negative mania  Central nervous system:  Cranial nerves II through XII intact, tongue/uvula midline, all extremities muscle strength 5/5, sensation intact throughout,         Labs on Admission:  Basic Metabolic Panel: Recent Labs  Lab 05/01/21 1100 05/01/21 1511  NA 136  --   K 3.8  --   CL 97*  --   CO2 24  --   GLUCOSE 250*  --   BUN 30*  --   CREATININE 1.21*  --   CALCIUM 9.3  --   MG  --  1.6*  PHOS  --  3.0   Liver Function Tests: Recent Labs  Lab 05/01/21 1100  AST 22  ALT 19  ALKPHOS 78  BILITOT 0.7  PROT 7.7  ALBUMIN 4.1   Recent Labs   Lab 05/01/21 1100  LIPASE 44   No results for input(s): AMMONIA in the last 168 hours. CBC: Recent Labs  Lab 05/01/21 1100  WBC 11.7*  NEUTROABS 9.3*  HGB 12.6  HCT 41.7  MCV 90.5  PLT 267   Cardiac Enzymes: No results for input(s): CKTOTAL, CKMB, CKMBINDEX, TROPONINI in the last 168 hours.  BNP (last 3 results) No results for input(s): BNP in the last 8760 hours.  ProBNP (last 3 results) No results for input(s): PROBNP in the last 8760 hours.  CBG: No results for input(s): GLUCAP in the last 168 hours.  Radiological Exams on  Admission: DG Chest 2 View  Result Date: 05/01/2021 CLINICAL DATA:  Chest pain EXAM: CHEST - 2 VIEW COMPARISON:  09/24/2019 FINDINGS: Mild cardiac enlargement without heart failure. Atherosclerotic aortic arch. Lungs clear without infiltrate or effusion. No acute skeletal abnormality. IMPRESSION: No active cardiopulmonary disease. Electronically Signed   By: Franchot Gallo M.D.   On: 05/01/2021 12:16   ECHOCARDIOGRAM LIMITED  Result Date: 05/01/2021    ECHOCARDIOGRAM LIMITED REPORT   Patient Name:   Regina Good Hild Date of Exam: 05/01/2021 Medical Rec #:  [id number]    Height:       65.0 in Accession #:    [id number]   Weight:       105.0 lb Date of Birth:  Jul 07, 1919   BSA:          1.504 m Patient Age:    48 years     BP:           142/71 mmHg Patient Gender: F            HR:           72 bpm. Exam Location:  [facility] Procedure: Limited Echo Indications:    Limited echo evaluate LV function and wall motion  History:        Patient has prior history of Echocardiogram examinations, most                 recent 09/25/2019. Previous Myocardial Infarction and CAD; Risk                 Factors:Hypertension, Diabetes and Dyslipidemia. CHRONIC KIDNEY                 DISEASE STAGE III (MODERATE), GERD.  Sonographer:    Alvino Chapel RCS Referring Phys: [id number] [place]  1. Left ventricular ejection fraction, by estimation, is 35 to 40%. The left  ventricle has moderately decreased function. The left ventricle demonstrates global hypokinesis. Left ventricular diastolic parameters are indeterminate.  2. Right ventricular systolic function is normal. The right ventricular size is normal.  3. Moderate mitral annular calcification.  4. There is moderate calcification of the aortic valve. There is moderate thickening of the aortic valve.  5. The inferior vena cava is normal in size with greater than 50% respiratory variability, suggesting right atrial pressure of 3 mmHg.  6. Limited echo to evaluate LV function FINDINGS  Left Ventricle: Left ventricular ejection fraction, by estimation, is 35 to 40%. The left ventricle has moderately decreased function. The left ventricle demonstrates global hypokinesis. Definity contrast agent was given IV to delineate the left ventricular endocardial borders. The left ventricular internal cavity size was normal in size. There is no left ventricular hypertrophy. Left ventricular diastolic parameters are indeterminate. Right Ventricle: The right ventricular size is normal. No increase in right ventricular wall thickness. Right ventricular systolic function is normal. Pericardium: There is no evidence of pericardial effusion. Mitral Valve: There is moderate thickening of the mitral valve leaflet(s). There is moderate calcification of the mitral valve leaflet(s). Moderate mitral annular calcification. Aortic Valve: There is moderate calcification of the aortic valve. There is moderate thickening of the aortic valve. There is moderate aortic valve annular calcification. Venous: The inferior vena cava is normal in size with greater than 50% respiratory variability, suggesting right atrial pressure of 3 mmHg. LEFT VENTRICLE PLAX 2D LVIDd:         4.70 cm LVIDs:         3.80 cm  LV PW:         1.00 cm LV IVS:        1.10 cm LVOT diam:     1.60 cm LVOT Area:     2.01 cm  LEFT ATRIUM         Index LA diam:    4.60 cm 3.06 cm/m   AORTA Ao  Root diam: 3.20 cm MITRAL VALVE MV Area (PHT): 3.08 cm     SHUNTS MV Decel Time: 246 msec     Systemic Diam: 1.60 cm MV E velocity: 136.00 cm/s MV A velocity: 129.00 cm/s MV E/A ratio:  1.05 Carlyle Dolly MD Electronically signed by Carlyle Dolly MD Signature Date/Time: 05/01/2021/4:19:23 PM    Final     EKG: Independently reviewed.   Assessment/Plan Principal Problem:   Chest pain Active Problems:   Type 2 diabetes mellitus with stage 3 chronic kidney disease (HCC)   HLD (hyperlipidemia)   Anxiety state   Essential hypertension   MYOCARDIAL INFARCTION, HX OF   CAD (coronary artery disease), native coronary artery   Chest pain/elevated troponin - Dr. Harl Bowie cardiology consulted by ED staff - Echocardiogram pending - Patient also states she does not care what the echocardiogram study shows she is not having any procedures performed at her age. - Hemoglobin A1c pending -Strict in and out - Daily weight -BP currently controlled without medication  Essential HTN - See chest pain  HLD -Lipitor 20 mg daily - Lipid panel pending  DM type II controlled with DM nephrology -2/15 hemoglobin A1c= 6.4 -Sensitive SSI  Anxiety - Xanax 0.25 mg QHS   Code Status: DNR (DVT Prophylaxis: Heparin drip Family Communication:   Status is: Inpatient    Dispo: The patient is from: Home              Anticipated d/c is to: Home              Anticipated d/c date is: 1 day              Patient currently is not medically stable to d/c.     Data Reviewed: Care during the described time interval was provided by me .  I have reviewed this patient's available data, including medical history, events of note, physical examination, and all test results as part of my evaluation.   The patient is critically ill with multiple organ systems failure and requires high complexity decision making for assessment and support, frequent evaluation and titration of therapies, application of advanced  monitoring technologies and extensive interpretation of multiple databases. Critical Care Time devoted to patient care services described in this note  Time spent: 70 minutes   Daphna Lafuente, [facility]

## 2021-05-01 NOTE — ED Triage Notes (Signed)
Pt bib ems from home for cp that started at 2 am that lasted until just prior to ems arrival.  Denies cp at present.  Bgl by ems 364.  Vss by ems.  Resp even and unlabored.  A&O x 4.  Sr with pvc noted on ems ekg.

## 2021-05-02 DIAGNOSIS — Z79899 Other long term (current) drug therapy: Secondary | ICD-10-CM | POA: Diagnosis not present

## 2021-05-02 DIAGNOSIS — E785 Hyperlipidemia, unspecified: Secondary | ICD-10-CM | POA: Diagnosis not present

## 2021-05-02 DIAGNOSIS — Z20822 Contact with and (suspected) exposure to covid-19: Secondary | ICD-10-CM | POA: Diagnosis not present

## 2021-05-02 DIAGNOSIS — I5022 Chronic systolic (congestive) heart failure: Secondary | ICD-10-CM

## 2021-05-02 DIAGNOSIS — I129 Hypertensive chronic kidney disease with stage 1 through stage 4 chronic kidney disease, or unspecified chronic kidney disease: Secondary | ICD-10-CM | POA: Diagnosis not present

## 2021-05-02 DIAGNOSIS — I10 Essential (primary) hypertension: Secondary | ICD-10-CM | POA: Diagnosis not present

## 2021-05-02 DIAGNOSIS — Z7982 Long term (current) use of aspirin: Secondary | ICD-10-CM | POA: Diagnosis not present

## 2021-05-02 DIAGNOSIS — K219 Gastro-esophageal reflux disease without esophagitis: Secondary | ICD-10-CM | POA: Diagnosis not present

## 2021-05-02 DIAGNOSIS — I2510 Atherosclerotic heart disease of native coronary artery without angina pectoris: Secondary | ICD-10-CM | POA: Diagnosis not present

## 2021-05-02 DIAGNOSIS — I214 Non-ST elevation (NSTEMI) myocardial infarction: Secondary | ICD-10-CM

## 2021-05-02 DIAGNOSIS — E1122 Type 2 diabetes mellitus with diabetic chronic kidney disease: Secondary | ICD-10-CM | POA: Diagnosis not present

## 2021-05-02 DIAGNOSIS — N183 Chronic kidney disease, stage 3 unspecified: Secondary | ICD-10-CM | POA: Diagnosis not present

## 2021-05-02 DIAGNOSIS — I252 Old myocardial infarction: Secondary | ICD-10-CM | POA: Diagnosis not present

## 2021-05-02 DIAGNOSIS — F411 Generalized anxiety disorder: Secondary | ICD-10-CM | POA: Diagnosis not present

## 2021-05-02 DIAGNOSIS — I200 Unstable angina: Secondary | ICD-10-CM | POA: Diagnosis not present

## 2021-05-02 LAB — MAGNESIUM: Magnesium: 1.6 mg/dL — ABNORMAL LOW (ref 1.7–2.4)

## 2021-05-02 LAB — CBC
HCT: 35.8 % — ABNORMAL LOW (ref 36.0–46.0)
Hemoglobin: 11.2 g/dL — ABNORMAL LOW (ref 12.0–15.0)
MCH: 28.9 pg (ref 26.0–34.0)
MCHC: 31.3 g/dL (ref 30.0–36.0)
MCV: 92.3 fL (ref 80.0–100.0)
Platelets: 228 10*3/uL (ref 150–400)
RBC: 3.88 MIL/uL (ref 3.87–5.11)
RDW: 13.7 % (ref 11.5–15.5)
WBC: 11.7 10*3/uL — ABNORMAL HIGH (ref 4.0–10.5)
nRBC: 0 % (ref 0.0–0.2)

## 2021-05-02 LAB — GLUCOSE, CAPILLARY
Glucose-Capillary: 93 mg/dL (ref 70–99)
Glucose-Capillary: 104 mg/dL — ABNORMAL HIGH (ref 70–99)
Glucose-Capillary: 151 mg/dL — ABNORMAL HIGH (ref 70–99)

## 2021-05-02 LAB — COMPREHENSIVE METABOLIC PANEL
ALT: 14 U/L (ref 0–44)
AST: 26 U/L (ref 15–41)
Albumin: 3.2 g/dL — ABNORMAL LOW (ref 3.5–5.0)
Alkaline Phosphatase: 61 U/L (ref 38–126)
Anion gap: 11 (ref 5–15)
BUN: 26 mg/dL — ABNORMAL HIGH (ref 8–23)
CO2: 22 mmol/L (ref 22–32)
Calcium: 8.5 mg/dL — ABNORMAL LOW (ref 8.9–10.3)
Chloride: 104 mmol/L (ref 98–111)
Creatinine, Ser: 1.07 mg/dL — ABNORMAL HIGH (ref 0.44–1.00)
GFR, Estimated: 47 mL/min — ABNORMAL LOW (ref >60)
Glucose, Bld: 108 mg/dL — ABNORMAL HIGH (ref 70–99)
Potassium: 3.6 mmol/L (ref 3.5–5.1)
Sodium: 137 mmol/L (ref 135–145)
Total Bilirubin: 0.9 mg/dL (ref 0.3–1.2)
Total Protein: 6.3 g/dL — ABNORMAL LOW (ref 6.5–8.1)

## 2021-05-02 LAB — BRAIN NATRIURETIC PEPTIDE: B Natriuretic Peptide: 1088 pg/mL — ABNORMAL HIGH (ref 0.0–100.0)

## 2021-05-02 LAB — TROPONIN I (HIGH SENSITIVITY): Troponin I (High Sensitivity): 2359 ng/L (ref <18)

## 2021-05-02 LAB — PHOSPHORUS: Phosphorus: 3 mg/dL (ref 2.5–4.6)

## 2021-05-02 MED ORDER — METOPROLOL SUCCINATE ER 25 MG PO TB24
25.0000 mg | ORAL_TABLET | Freq: Every day | ORAL | Status: DC
  Administered 2021-05-02: 25 mg via ORAL
  Filled 2021-05-02: qty 1

## 2021-05-02 MED ORDER — CLOPIDOGREL BISULFATE 75 MG PO TABS: 75.0000 mg | ORAL_TABLET | Freq: Every day | ORAL | 0 refills | Status: DC

## 2021-05-02 MED ORDER — SPIRONOLACTONE 25 MG PO TABS
12.5000 mg | ORAL_TABLET | Freq: Every day | ORAL | Status: DC
  Administered 2021-05-02: 12.5 mg via ORAL
  Filled 2021-05-02 (×2): qty 0.5

## 2021-05-02 MED ORDER — CLOPIDOGREL BISULFATE 75 MG PO TABS
75.0000 mg | ORAL_TABLET | Freq: Every day | ORAL | Status: DC
  Administered 2021-05-02: 75 mg via ORAL
  Filled 2021-05-02: qty 1

## 2021-05-02 NOTE — Progress Notes (Signed)
Date and time results received: 05/02/21 0636  Critical Value: Troponin 2,359  Name of Provider Notified: Dr. Clearence Ped  Orders Received? Or Actions Taken?: No new orders. EKG obtained this morning. Patient on heparin drip at this time. Patient denies any chest pain.

## 2021-05-02 NOTE — Consult Note (Addendum)
Cardiology Consultation:   Patient ID: Regina Good MRN: [id number]; DOB: Feb 15, 1920  Admit date: 05/01/2021 Date of Consult: 05/02/2021  PCP:  Celene Squibb, MD   [facility] Cardiologist:  Carlyle Dolly, MD        Patient Profile:   Regina Good is a 88 y.o. female with a hx of CAD (s/p stenting in 2005, MI in 06/2019 and refused additional work-up at that time, admit in 09/2019 for fatigue and abnormal EKG and med management recommended), HFrEF (EF 30-35% in 09/2019), HTN, HLD, Type 2 DM and GERD who is being seen 05/02/2021 for the evaluation of NSTEMI at the request of Dr. Sherral Hammers.  History of Present Illness:   [name] was last evaluated by the Cardiology service during admission in 09/2019 for generalized fatigue and was found to have an abnormal EKG with ST depression along with T wave inversion along the inferior leads and echocardiogram showed a reduced EF of 30 to 35%. She declined further work-up given her age and was open to medical therapy. Was started on Toprol-XL 25mg  daily, Spironolactone 12.5 mg daily and Lasix 40 mg MWF.   She presented back to [facility] ED on 05/01/2021 for evaluation of chest pain which had started earlier that morning. In talking with the patient today, she reports living with her daughter but was previously driving 6 months ago. She is mostly in the house throughout the day but denies any recent chest pain or dyspnea on exertion with her routine activities up until the night prior to admission. Reports she felt like she had indigestion throughout the night and was taking OTC medicines for reflux with some improvement but symptoms persisted. She came to the ED at the encouragement of family members. She denies any recurrent pain since admission and no pain this morning. No recent orthopnea, PND or pitting edema.   Initial labs show WBC 11.7, Hgb 12.6, platelets 267, Na+ 136, K+ 3.8 and creatinine 1.21. Negative for COVID and influenza. BNP  1088. Initial Hs Troponin values flat at 67, 77, 81, 68, 59 and 59 with most recent value this AM being elevated to 2359. FLP shows total cholesterol 121, triglycerides 47, HDL 50 and LDL 62. CXR with no active cardiopulmonary disease.  EKG shows normal sinus rhythm, heart rate 87 with LBBB and her LBBB has been noted on prior tracings. Limited echocardiogram shows a reduced EF of 35 to 40% with global hypokinesis. Noted to have moderate calcification of the aortic valve.  She has been started on ASA 81 mg daily along with Atorvastatin 20 mg daily and IV Heparin.  She is refusing blood draws this AM given her frequent labs since admission and wants to go home today. Says she has "lived a good life and is ready to be reunited with her husband of 87 years who passed away 7+ years ago". Does not want any additional procedures or labs.     Past Medical History:  Diagnosis Date   Anxiety    CAD (coronary artery disease), native coronary artery    Diabetes mellitus    Family history of diabetes mellitus    GERD (gastroesophageal reflux disease)    History of melanoma    Hyperlipidemia    Hypertension     Past Surgical History:  Procedure Laterality Date   APPENDECTOMY     CORONARY STENT PLACEMENT  2004   NM MYOVIEW LTD  2010   RENAL ARTERY STENT  2006  Home Medications:  Prior to Admission medications   Medication Sig Start Date End Date Taking? Authorizing Provider  acetaminophen (TYLENOL) 325 MG tablet Take 2 tablets (650 mg total) by mouth every 6 (six) hours as needed for mild pain (or Fever >/= 101). 09/26/19  Yes Emokpae, Courage, MD  ALPRAZolam (XANAX) 0.25 MG tablet Take 1 tablet (0.25 mg total) by mouth at bedtime. 09/26/19  Yes Emokpae, Courage, MD  atorvastatin (LIPITOR) 20 MG tablet Take 1 tablet (20 mg total) by mouth daily. 09/26/19  Yes Emokpae, Courage, MD  bismuth subsalicylate (PEPTO BISMOL) 262 MG chewable tablet Chew 262 mg by mouth as needed.   Yes [provider]  bismuth subsalicylate (PEPTO BISMOL) 262 MG/15ML suspension Take 30 mLs by mouth every 6 (six) hours as needed.   Yes [provider]  furosemide (LASIX) 40 MG tablet Take 1 tablet (40 mg total) by mouth every Monday, Wednesday, and Friday. 09/28/19  Yes Emokpae, Courage, MD  metoprolol succinate (TOPROL-XL) 25 MG 24 hr tablet Take 1 tablet (25 mg total) by mouth daily. 09/27/19  Yes Emokpae, Courage, MD  omeprazole (PRILOSEC) 20 MG capsule Take 1 capsule (20 mg total) by mouth daily. 09/26/19  Yes Emokpae, Courage, MD  spironolactone (ALDACTONE) 25 MG tablet Take 0.5 tablets (12.5 mg total) by mouth daily. 09/27/19  Yes Emokpae, Courage, MD  sucralfate (CARAFATE) 1 g tablet Take 1 g by mouth 3 (three) times daily. 02/14/21  Yes [provider]  aspirin 81 MG chewable tablet Chew 1 tablet (81 mg total) by mouth daily with breakfast. Patient not taking: Reported on 05/01/2021 09/26/19   Roxan Hockey, MD  ondansetron (ZOFRAN) 4 MG tablet Take 1 tablet (4 mg total) by mouth every 6 (six) hours as needed for nausea. Patient not taking: Reported on 05/01/2021 09/26/19   Roxan Hockey, MD    Inpatient Medications: Scheduled Meds:  ALPRAZolam  0.25 mg Oral QHS   aspirin EC  81 mg Oral Daily   atorvastatin  20 mg Oral Daily   insulin aspart  0-9 Units Subcutaneous Q4H   metoprolol succinate  25 mg Oral Daily   spironolactone  12.5 mg Oral Daily   sucralfate  1 g Oral TID   Continuous Infusions:  heparin 650 Units/hr (05/02/21 0047)   PRN Meds: acetaminophen, bismuth subsalicylate, ondansetron (ZOFRAN) IV  Allergies:    Allergies  Allergen Reactions   Codeine Other (See Comments)    Sensation of being out of this world.   Sulfonamide Derivatives Other (See Comments)    Makes her feel like dreaming while awake.    Social History:   Social History   Socioeconomic History   Marital status: Widowed    Spouse name: Not on file   Number of children: Not on  file   Years of education: Not on file   Highest education level: Not on file  Occupational History   Occupation: Retired, previously a Insurance claims handler  Tobacco Use   Smoking status: Never   Smokeless tobacco: Never  Vaping Use   Vaping Use: Never used  Substance and Sexual Activity   Alcohol use: No   Drug use: No   Sexual activity: Not on file  Other Topics Concern   Not on file  Social History Narrative   Lives with spouse who is 51 and schizophrenic daughter   Social Determinants of Radio broadcast assistant Strain: Not on file  Food Insecurity: Not on file  Transportation Needs: Not on file  Physical Activity: Not on file  Stress: Not on file  Social Connections: Not on file  Intimate Partner Violence: Not on file    Family History:    Family History  Problem Relation Age of Onset   Pancreatic cancer Mother    Coronary artery disease Father    Diabetes Brother    Colon cancer Sister    Stroke Sister    Stroke Sister    Diabetes Son    Stroke Son    Schizophrenia Daughter      ROS:  Please see the history of present illness.   All other ROS reviewed and negative.     Physical Exam/Data:   Vitals:   05/01/21 1800 05/01/21 2010 05/02/21 0159 05/02/21 0419  BP: (!) 143/63 (!) 148/90 136/78 (!) 147/69  Pulse: 80 (!) 109 86 94  Resp: 16 20 19 16   Temp:  97.9 F (36.6 C) 98 F (36.7 C) (!) 97.5 F (36.4 C)  TempSrc:    Oral  SpO2: 92% 97% 100% 98%  Weight:  52.4 kg  52 kg  Height:  5\' 5"  (7.494 m)      Intake/Output Summary (Last 24 hours) at 05/02/2021 0830 Last data filed at 05/02/2021 0603 Gross per 24 hour  Intake 1089.85 ml  Output 200 ml  Net 889.85 ml   Last 3 Weights 05/02/2021 05/01/2021 05/01/2021  Weight (lbs) 114 lb 11.2 oz 115 lb 8.3 oz 105 lb  Weight (kg) 52.028 kg 52.4 kg 47.628 kg     Body mass index is 19.09 kg/m.  General: Pleasant elderly female appearing in no acute distress. Hard of hearing.  HEENT: normal Neck: no  JVD Vascular: No carotid bruits; Distal pulses 2+ bilaterally Cardiac:  normal S1, S2; RRR; 2/6 SEM along RUSB.  Lungs:  clear to auscultation bilaterally, no wheezing, rhonchi or rales  Abd: soft, nontender, no hepatomegaly  Ext: no pitting edema Musculoskeletal:  No deformities, BUE and BLE strength normal and equal Skin: warm and dry  Neuro:  CNs 2-12 intact, no focal abnormalities noted Psych:  Normal affect   EKG:  The EKG was personally reviewed and demonstrates: NSR, heart rate 87 with LBBB.  Telemetry:  Telemetry was personally reviewed and demonstrates: NSR, HR in 80's to 90's with occasional PVC's and episodes of ventricular trigeminy.   Relevant CV Studies:  Echocardiogram: 09/2019 IMPRESSIONS     1. Diffuse hypokinesis with mid and basal wall akinesis . Left  ventricular ejection fraction, by estimation, is 30 to 35%. The left  ventricle has moderately decreased function. The left ventricle  demonstrates regional wall motion abnormalities (see  scoring diagram/findings for description). The left ventricular internal  cavity size was moderately dilated. Left ventricular diastolic parameters  are indeterminate.   2. Right ventricular systolic function is normal. The right ventricular  size is normal. There is normal pulmonary artery systolic pressure.   3. Left atrial size was mildly dilated.   4. The mitral valve is degenerative. Moderate to severe mitral valve  regurgitation. No evidence of mitral stenosis.   5. The aortic valve is tricuspid. Aortic valve regurgitation is mild.  severe sclerosis with no stenosis.   Limited Echo: 05/01/2021 IMPRESSIONS     1. Left ventricular ejection fraction, by estimation, is 35 to 40%. The  left ventricle has moderately decreased function. The left ventricle  demonstrates global hypokinesis. Left ventricular diastolic parameters are  indeterminate.   2. Right ventricular systolic function is normal. The right ventricular  size is normal.   3. Moderate mitral annular calcification.   4. There is moderate calcification of the aortic valve. There is moderate  thickening of the aortic valve.   5. The inferior vena cava is normal in size with greater than 50%  respiratory variability, suggesting right atrial pressure of 3 mmHg.   6. Limited echo to evaluate LV function   Laboratory Data:  High Sensitivity Troponin:   Recent Labs  Lab 05/01/21 1511 05/01/21 1708 05/01/21 1918 05/01/21 2129 05/02/21 0443  TROPONINIHS 81* 68* 59* 59* 2,359*     Chemistry Recent Labs  Lab 05/01/21 1100 05/01/21 1511 05/02/21 0443  NA 136  --  137  K 3.8  --  3.6  CL 97*  --  104  CO2 24  --  22  GLUCOSE 250*  --  108*  BUN 30*  --  26*  CREATININE 1.21*  --  1.07*  CALCIUM 9.3  --  8.5*  MG  --  1.6* 1.6*  GFRNONAA 40*  --  47*  ANIONGAP 15  --  11    Recent Labs  Lab 05/01/21 1100 05/02/21 0443  PROT 7.7 6.3*  ALBUMIN 4.1 3.2*  AST 22 26  ALT 19 14  ALKPHOS 78 61  BILITOT 0.7 0.9   Lipids  Recent Labs  Lab 05/01/21 1511  CHOL 121  TRIG 47  HDL 50  LDLCALC 62  CHOLHDL 2.4    Hematology Recent Labs  Lab 05/01/21 1100 05/02/21 0443  WBC 11.7* 11.7*  RBC 4.61 3.88  HGB 12.6 11.2*  HCT 41.7 35.8*  MCV 90.5 92.3  MCH 27.3 28.9  MCHC 30.2 31.3  RDW 13.4 13.7  PLT 267 228   Thyroid No results for input(s): TSH, FREET4 in the last 168 hours.  BNP Recent Labs  Lab 05/02/21 0443  BNP 1,088.0*    DDimer No results for input(s): DDIMER in the last 168 hours.   Radiology/Studies:  DG Chest 2 View  Result Date: 05/01/2021 CLINICAL DATA:  Chest pain EXAM: CHEST - 2 VIEW COMPARISON:  09/24/2019 FINDINGS: Mild cardiac enlargement without heart failure. Atherosclerotic aortic arch. Lungs clear without infiltrate or effusion. No acute skeletal abnormality. IMPRESSION: No active cardiopulmonary disease. Electronically Signed   By: Franchot Gallo M.D.   On: 05/01/2021 12:16       Assessment and Plan:   1. NSTEMI/CAD - She has known CAD with prior stenting in 2005 and had a recurrent MI in 06/2019 and refused additional testing or work-up at that time. Presented with pain starting the night prior to admission which she initially thought was acid reflux but symptoms persisted. Thankfully, she denies any recurrent pain since admission. - Most recent Hs Troponin value had trended up to 2359 and repeat limited echocardiogram shows a reduced EF of 35 to 40% with global hypokinesis which is overall similar to prior imaging in 2021.  - She has refused additional work-up in the past given her advanced age and declines additional work-up at this time which certainly seems reasonable. Ideally, would treat with IV Heparin for 48 hours but she is refusing additional lab draws and wishes to go home today. Says she is ready to be reunited with her husband of 52 years who passed away 7+ years ago.  - She has been continued on ASA and PTA Atorvastatin 20mg  daily. Will order her PTA Toprol-XL and Spironolactone as well given her stable BP. If she has recurrent pain, could add Imdur. Would  provide with Rx for SL NTG to have at home to help with symptom management.   2. HFrEF - Her EF was at 30-35% in 09/2019 and similar by repeat limited echo this admission at 35-40%. BNP at 1088 on admission but she does not appear volume overloaded by examination. She was on Toprol-XL 25mg  daily, Spironolactone 12.5mg  daily and Lasix 40mg  MWF prior to admission. Would continue current regimen. Would not aggressively treat with the addition of an ARB or Entresto given concerns for intermittent hypotension in the setting of her advanced age. Would also not use an SGLT2 inhibitor given her variable appetite.   3. HLD - FLP shows LDL at 62. She has been continued on PTA Atorvastatin 20mg  daily.     Risk Assessment/Risk Scores:     TIMI Risk Score for Unstable Angina or Non-ST Elevation MI:   The  patient's TIMI risk score is 6, which indicates a 41% risk of all cause mortality, new or recurrent myocardial infarction or need for urgent revascularization in the next 14 days.   For questions or updates, please contact [place] Please consult [email or web address] for contact info under    Signed, Erma Heritage, PA-C  05/02/2021 8:30 AM  Attending note      NSTEMI -history of prior CAD as reported above - trop up to 2359, EKG LBBB that is new - 09/2019 echo LVEF 30-35% - 04/2021 echo LVEF 35-40%  - consistent with prior admissions she is not interested in invasive testing and at her age very sound decision that I would agree with. She is adamant about going home as opposed to additioanal 24 hours of IV heparin. I think reasonable decision as she has been consistent about her wishes over the years to avoid extensive medical testing and treamtents.  - medical therapy with torpol 25, atorva 20, ASA 81. Add plavix 75mg  daily for medically managed NSTEMI   2. Chronic systolic HF - diagnosed 0/9233,  echo LVEF 30-35% -at the time given advanced age patient did not want to pursue invasive testing, she remains consistent with this decision - limited medication therapy given advanced age, less benefit and risk for side effects. Continue current regimen   Ok for discharge, we will arrange follow up.   Carlyle Dolly MD

## 2021-05-02 NOTE — Plan of Care (Signed)

## 2021-05-02 NOTE — Discharge Summary (Signed)
Physician Discharge Summary  Regina Good GBT:[id number] DOB: 12/11/1919 DOA: 05/01/2021  PCP: Celene Squibb, MD  Admit date: 05/01/2021 Discharge date: 05/02/2021  Time spent: 35 minutes  Recommendations for Outpatient Follow-up:  Per cardiology recommendation patient cleared for discharge.  See below plan of care  1. NSTEMI/CAD - She has known CAD with prior stenting in 2005 and had a recurrent MI in 06/2019 and refused additional testing or work-up at that time. Presented with pain starting the night prior to admission which she initially thought was acid reflux but symptoms persisted. Thankfully, she denies any recurrent pain since admission. - Most recent Hs Troponin value had trended up to 2359 and repeat limited echocardiogram shows a reduced EF of 35 to 40% with global hypokinesis which is overall similar to prior imaging in 2021.  - She has refused additional work-up in the past given her advanced age and declines additional work-up at this time which certainly seems reasonable. Ideally, would treat with IV Heparin for 48 hours but she is refusing additional lab draws and wishes to go home today. Says she is ready to be reunited with her husband of 32 years who passed away 7+ years ago.  - She has been continued on ASA and PTA Atorvastatin 20mg  daily. Will order her PTA Toprol-XL and Spironolactone as well given her stable BP. If she has recurrent pain, could add Imdur. Would provide with Rx for SL NTG to have at home to help with symptom management.    2. HFrEF - Her EF was at 30-35% in 09/2019 and similar by repeat limited echo this admission at 35-40%. BNP at 1088 on admission but she does not appear volume overloaded by examination. She was on Toprol-XL 25mg  daily, Spironolactone 12.5mg  daily and Lasix 40mg  MWF prior to admission. Would continue current regimen. Would not aggressively treat with the addition of an ARB or Entresto given concerns for intermittent hypotension in the setting  of her advanced age. Would also not use an SGLT2 inhibitor given her variable appetite.    3. HLD - FLP shows LDL at 62. She has been continued on PTA Atorvastatin 20mg  daily.    Discharge Diagnoses:  Principal Problem:   Chest pain Active Problems:   Type 2 diabetes mellitus with stage 3 chronic kidney disease (HCC)   HLD (hyperlipidemia)   Anxiety state   Essential hypertension   MYOCARDIAL INFARCTION, HX OF   CAD (coronary artery disease), native coronary artery   Discharge Condition: Stable  Diet recommendation: Heart healthy  Filed Weights   05/01/21 1037 05/01/21 2010 05/02/21 0419  Weight: 47.6 kg 52.4 kg 52 kg    History of present illness:   Regina Good is a 88 y.o. WF PMHx Anxiety, DM Type II, CAD with stent placement, Essential HTN, HLD, patient states she has not had any pain the remainder of the day, negative SOB.  Patient also states she does not care what the echocardiogram study shows she is not having any procedures performed at her age.  Hospital Course:  See above  Procedures: 2/15 Echocardiogram limited: Left Ventricle: LVEF=35 to 40%. The left ventricle has moderately decreased function. The left  ventricle demonstrates global hypokinesis.  -Left ventricular diastolic parameters are indeterminate.    Consultations: Cardiology Dr. Harl Bowie     Discharge Exam: Vitals:   05/01/21 2010 05/02/21 0159 05/02/21 0419 05/02/21 0919  BP: (!) 148/90 136/78 (!) 147/69 (!) 135/54  Pulse: (!) 109 86 94 92  Resp: 20 19  16   Temp: 97.9 F (36.6 C) 98 F (36.7 C) (!) 97.5 F (36.4 C)   TempSrc:   Oral   SpO2: 97% 100% 98%   Weight: 52.4 kg  52 kg   Height: 5\' 5"  (1.651 m)       General: A/O x4 No acute respiratory distress Eyes: negative scleral hemorrhage, negative anisocoria, negative icterus ENT: Negative Runny nose, negative gingival bleeding, Neck:  Negative scars, masses, torticollis, lymphadenopathy, JVD Lungs: Clear to auscultation  bilaterally without wheezes or crackles Cardiovascular: Regular rate and rhythm without murmur gallop or rub normal S1 and S2  Discharge Instructions   Allergies as of 05/02/2021       Reactions   Codeine Other (See Comments)   Sensation of being out of this world.   Sulfonamide Derivatives Other (See Comments)   Makes her feel like dreaming while awake.        Medication List     STOP taking these medications    ondansetron 4 MG tablet Commonly known as: ZOFRAN       TAKE these medications    acetaminophen 325 MG tablet Commonly known as: TYLENOL Take 2 tablets (650 mg total) by mouth every 6 (six) hours as needed for mild pain (or Fever >/= 101).   ALPRAZolam 0.25 MG tablet Commonly known as: XANAX Take 1 tablet (0.25 mg total) by mouth at bedtime.   aspirin 81 MG chewable tablet Chew 1 tablet (81 mg total) by mouth daily with breakfast.   atorvastatin 20 MG tablet Commonly known as: Lipitor Take 1 tablet (20 mg total) by mouth daily.   bismuth subsalicylate 297 LG/92JJ suspension Commonly known as: PEPTO BISMOL Take 30 mLs by mouth every 6 (six) hours as needed. What changed: Another medication with the same name was removed. Continue taking this medication, and follow the directions you see here.   clopidogrel 75 MG tablet Commonly known as: PLAVIX Take 1 tablet (75 mg total) by mouth daily.   furosemide 40 MG tablet Commonly known as: LASIX Take 1 tablet (40 mg total) by mouth every Monday, Wednesday, and Friday.   metoprolol succinate 25 MG 24 hr tablet Commonly known as: TOPROL-XL Take 1 tablet (25 mg total) by mouth daily.   omeprazole 20 MG capsule Commonly known as: PRILOSEC Take 1 capsule (20 mg total) by mouth daily.   spironolactone 25 MG tablet Commonly known as: ALDACTONE Take 0.5 tablets (12.5 mg total) by mouth daily.   sucralfate 1 g tablet Commonly known as: CARAFATE Take 1 g by mouth 3 (three) times daily.       Allergies   Allergen Reactions   Codeine Other (See Comments)    Sensation of being out of this world.   Sulfonamide Derivatives Other (See Comments)    Makes her feel like dreaming while awake.      The results of significant diagnostics from this hospitalization (including imaging, microbiology, ancillary and laboratory) are listed below for reference.    Significant Diagnostic Studies: DG Chest 2 View  Result Date: 05/01/2021 CLINICAL DATA:  Chest pain EXAM: CHEST - 2 VIEW COMPARISON:  09/24/2019 FINDINGS: Mild cardiac enlargement without heart failure. Atherosclerotic aortic arch. Lungs clear without infiltrate or effusion. No acute skeletal abnormality. IMPRESSION: No active cardiopulmonary disease. Electronically Signed   By: Franchot Gallo M.D.   On: 05/01/2021 12:16   ECHOCARDIOGRAM LIMITED  Result Date: 05/01/2021    ECHOCARDIOGRAM LIMITED REPORT   Patient Name:   Regina Good Date  of Exam: 05/01/2021 Medical Rec #:  [id number]    Height:       65.0 in Accession #:    [id number]   Weight:       105.0 lb Date of Birth:  1919-12-05   BSA:          1.504 m Patient Age:    37 years     BP:           142/71 mmHg Patient Gender: F            HR:           72 bpm. Exam Location:  [facility] Procedure: Limited Echo Indications:    Limited echo evaluate LV function and wall motion  History:        Patient has prior history of Echocardiogram examinations, most                 recent 09/25/2019. Previous Myocardial Infarction and CAD; Risk                 Factors:Hypertension, Diabetes and Dyslipidemia. CHRONIC KIDNEY                 DISEASE STAGE III (MODERATE), GERD.  Sonographer:    Alvino Chapel RCS Referring Phys: [id number] Sims  1. Left ventricular ejection fraction, by estimation, is 35 to 40%. The left ventricle has moderately decreased function. The left ventricle demonstrates global hypokinesis. Left ventricular diastolic parameters are indeterminate.  2. Right ventricular  systolic function is normal. The right ventricular size is normal.  3. Moderate mitral annular calcification.  4. There is moderate calcification of the aortic valve. There is moderate thickening of the aortic valve.  5. The inferior vena cava is normal in size with greater than 50% respiratory variability, suggesting right atrial pressure of 3 mmHg.  6. Limited echo to evaluate LV function FINDINGS  Left Ventricle: Left ventricular ejection fraction, by estimation, is 35 to 40%. The left ventricle has moderately decreased function. The left ventricle demonstrates global hypokinesis. Definity contrast agent was given IV to delineate the left ventricular endocardial borders. The left ventricular internal cavity size was normal in size. There is no left ventricular hypertrophy. Left ventricular diastolic parameters are indeterminate. Right Ventricle: The right ventricular size is normal. No increase in right ventricular wall thickness. Right ventricular systolic function is normal. Pericardium: There is no evidence of pericardial effusion. Mitral Valve: There is moderate thickening of the mitral valve leaflet(s). There is moderate calcification of the mitral valve leaflet(s). Moderate mitral annular calcification. Aortic Valve: There is moderate calcification of the aortic valve. There is moderate thickening of the aortic valve. There is moderate aortic valve annular calcification. Venous: The inferior vena cava is normal in size with greater than 50% respiratory variability, suggesting right atrial pressure of 3 mmHg. LEFT VENTRICLE PLAX 2D LVIDd:         4.70 cm LVIDs:         3.80 cm LV PW:         1.00 cm LV IVS:        1.10 cm LVOT diam:     1.60 cm LVOT Area:     2.01 cm  LEFT ATRIUM         Index LA diam:    4.60 cm 3.06 cm/m   AORTA Ao Root diam: 3.20 cm MITRAL VALVE MV Area (PHT): 3.08 cm     SHUNTS MV Decel Time: 246 msec  Systemic Diam: 1.60 cm MV E velocity: 136.00 cm/s MV A velocity: 129.00 cm/s MV  E/A ratio:  1.05 Carlyle Dolly MD Electronically signed by Carlyle Dolly MD Signature Date/Time: 05/01/2021/4:19:23 PM    Final     Microbiology: Recent Results (from the past 240 hour(s))  Resp Panel by RT-PCR (Flu A&B, Covid) Nasopharyngeal Swab     Status: None   Collection Time: 05/01/21  2:58 PM   Specimen: Nasopharyngeal Swab; Nasopharyngeal(NP) swabs in vial transport medium  Result Value Ref Range Status   SARS Coronavirus 2 by RT PCR NEGATIVE NEGATIVE Final    Comment: (NOTE) SARS-CoV-2 target nucleic acids are NOT DETECTED.  The SARS-CoV-2 RNA is generally detectable in upper respiratory specimens during the acute phase of infection. The lowest concentration of SARS-CoV-2 viral copies this assay can detect is 138 copies/mL. A negative result does not preclude SARS-Cov-2 infection and should not be used as the sole basis for treatment or other patient management decisions. A negative result may occur with  improper specimen collection/handling, submission of specimen other than nasopharyngeal swab, presence of viral mutation(s) within the areas targeted by this assay, and inadequate number of viral copies(<138 copies/mL). A negative result must be combined with clinical observations, patient history, and epidemiological information. The expected result is Negative.  Fact Sheet for Patients:  EntrepreneurPulse.com.au  Fact Sheet for Healthcare Providers:  IncredibleEmployment.be  This test is no t yet approved or cleared by the [place] FDA and  has been authorized for detection and/or diagnosis of SARS-CoV-2 by FDA under an Emergency Use Authorization (EUA). This EUA will remain  in effect (meaning this test can be used) for the duration of the COVID-19 declaration under Section 564(b)(1) of the Act, 21 U.S.C.section 360bbb-3(b)(1), unless the authorization is terminated  or revoked sooner.       Influenza A by PCR NEGATIVE  NEGATIVE Final   Influenza B by PCR NEGATIVE NEGATIVE Final    Comment: (NOTE) The Xpert Xpress SARS-CoV-2/FLU/RSV plus assay is intended as an aid in the diagnosis of influenza from Nasopharyngeal swab specimens and should not be used as a sole basis for treatment. Nasal washings and aspirates are unacceptable for Xpert Xpress SARS-CoV-2/FLU/RSV testing.  Fact Sheet for Patients: EntrepreneurPulse.com.au  Fact Sheet for Healthcare Providers: IncredibleEmployment.be  This test is not yet approved or cleared by the [place] FDA and has been authorized for detection and/or diagnosis of SARS-CoV-2 by FDA under an Emergency Use Authorization (EUA). This EUA will remain in effect (meaning this test can be used) for the duration of the COVID-19 declaration under Section 564(b)(1) of the Act, 21 U.S.C. section 360bbb-3(b)(1), unless the authorization is terminated or revoked.  Performed at [facility], [address], [place], [place] [address]      Labs: Basic Metabolic Panel: Recent Labs  Lab 05/01/21 1100 05/01/21 1511 05/02/21 0443  NA 136  --  137  K 3.8  --  3.6  CL 97*  --  104  CO2 24  --  22  GLUCOSE 250*  --  108*  BUN 30*  --  26*  CREATININE 1.21*  --  1.07*  CALCIUM 9.3  --  8.5*  MG  --  1.6* 1.6*  PHOS  --  3.0 3.0   Liver Function Tests: Recent Labs  Lab 05/01/21 1100 05/02/21 0443  AST 22 26  ALT 19 14  ALKPHOS 78 61  BILITOT 0.7 0.9  PROT 7.7 6.3*  ALBUMIN 4.1 3.2*  Recent Labs  Lab 05/01/21 1100  LIPASE 44   No results for input(s): AMMONIA in the last 168 hours. CBC: Recent Labs  Lab 05/01/21 1100 05/02/21 0443  WBC 11.7* 11.7*  NEUTROABS 9.3*  --   HGB 12.6 11.2*  HCT 41.7 35.8*  MCV 90.5 92.3  PLT 267 228   Cardiac Enzymes: No results for input(s): CKTOTAL, CKMB, CKMBINDEX, TROPONINI in the last 168 hours. BNP: BNP (last 3 results) Recent Labs    05/02/21 0443  BNP 1,088.0*     ProBNP (last 3 results) No results for input(s): PROBNP in the last 8760 hours.  CBG: Recent Labs  Lab 05/01/21 2015 05/01/21 2329 05/02/21 0416 05/02/21 0742  GLUCAP 257* 117* 93 104*       Signed:  Dia Crawford, MD [facility]

## 2021-05-02 NOTE — Progress Notes (Signed)
ANTICOAGULATION CONSULT NOTE - Follow Up Consult  Pharmacy Consult for heparin Indication: chest pain/ACS  Labs: Recent Labs    05/01/21 1100 05/01/21 1311 05/01/21 1708 05/01/21 1918 05/01/21 2129  HGB 12.6  --   --   --   --   HCT 41.7  --   --   --   --   PLT 267  --   --   --   --   HEPARINUNFRC  --   --   --  0.29*  --   CREATININE 1.21*  --   --   --   --   TROPONINIHS 67*   < > 68* 59* 59*   < > = values in this interval not displayed.    Assessment: 88yo female slightly subtherapeutic on heparin with initial dosing for CP though lab was drawn a few hours early and bolus may have affected level; no infusion issues or signs of bleeding per RN.  Goal of Therapy:  Heparin level 0.3-0.7 units/ml   Plan:  Will increase heparin infusion by 1 unit/kg/hr to 650 units/hr and check level in 8 hours.    Wynona Neat, PharmD, BCPS  05/02/2021,12:29 AM

## 2021-05-02 NOTE — Progress Notes (Signed)
Alert and verbal. Able to make needs known to staff. Discharge summary and education provided to pt and son. Pt verbalized understanding. Personal belongings packed by pt son and given to pt. Pt propelled via wheelchair to hospital entrance where pt got into personal vehicle driven by pt son. No distress noted at time of departure.

## 2021-05-02 NOTE — Progress Notes (Signed)
°  Transition of Care (TOC) Screening Note   Patient Details  Name: Regina Good Date of Birth: 08-02-1919   Transition of Care [facility]) CM/SW Contact:    Iona Beard, [place] Phone Number: 05/02/2021, 11:55 AM    Transition of Care Department [facility]) has reviewed patient and no TOC needs have been identified at this time. We will continue to monitor patient advancement through interdisciplinary progression rounds. If new patient transition needs arise, please place a TOC consult.

## 2021-05-09 DIAGNOSIS — I214 Non-ST elevation (NSTEMI) myocardial infarction: Secondary | ICD-10-CM | POA: Diagnosis not present

## 2021-05-09 DIAGNOSIS — R739 Hyperglycemia, unspecified: Secondary | ICD-10-CM | POA: Diagnosis not present

## 2021-05-09 DIAGNOSIS — I509 Heart failure, unspecified: Secondary | ICD-10-CM | POA: Diagnosis not present

## 2021-05-09 DIAGNOSIS — E785 Hyperlipidemia, unspecified: Secondary | ICD-10-CM | POA: Diagnosis not present

## 2021-06-06 DIAGNOSIS — R350 Frequency of micturition: Secondary | ICD-10-CM | POA: Diagnosis not present

## 2021-06-13 DIAGNOSIS — N390 Urinary tract infection, site not specified: Secondary | ICD-10-CM | POA: Diagnosis not present

## 2021-06-13 DIAGNOSIS — R110 Nausea: Secondary | ICD-10-CM | POA: Diagnosis not present

## 2021-06-20 ENCOUNTER — Emergency Department (HOSPITAL_COMMUNITY): Payer: Medicare Other

## 2021-06-20 ENCOUNTER — Other Ambulatory Visit: Payer: Self-pay

## 2021-06-20 ENCOUNTER — Encounter (HOSPITAL_COMMUNITY): Payer: Self-pay

## 2021-06-20 ENCOUNTER — Emergency Department (HOSPITAL_COMMUNITY)
Admission: EM | Admit: 2021-06-20 | Discharge: 2021-06-20 | Disposition: A | Discharge status: Home / Self Care | Payer: Medicare Other | Attending: Emergency Medicine | Admitting: Emergency Medicine

## 2021-06-20 DIAGNOSIS — Z7984 Long term (current) use of oral hypoglycemic drugs: Secondary | ICD-10-CM | POA: Insufficient documentation

## 2021-06-20 DIAGNOSIS — E119 Type 2 diabetes mellitus without complications: Secondary | ICD-10-CM | POA: Insufficient documentation

## 2021-06-20 DIAGNOSIS — I129 Hypertensive chronic kidney disease with stage 1 through stage 4 chronic kidney disease, or unspecified chronic kidney disease: Secondary | ICD-10-CM | POA: Insufficient documentation

## 2021-06-20 DIAGNOSIS — Z79899 Other long term (current) drug therapy: Secondary | ICD-10-CM | POA: Diagnosis not present

## 2021-06-20 DIAGNOSIS — M47816 Spondylosis without myelopathy or radiculopathy, lumbar region: Secondary | ICD-10-CM | POA: Diagnosis not present

## 2021-06-20 DIAGNOSIS — N183 Chronic kidney disease, stage 3 unspecified: Secondary | ICD-10-CM | POA: Diagnosis not present

## 2021-06-20 DIAGNOSIS — I2510 Atherosclerotic heart disease of native coronary artery without angina pectoris: Secondary | ICD-10-CM | POA: Diagnosis not present

## 2021-06-20 DIAGNOSIS — K5900 Constipation, unspecified: Secondary | ICD-10-CM | POA: Insufficient documentation

## 2021-06-20 DIAGNOSIS — Z7982 Long term (current) use of aspirin: Secondary | ICD-10-CM | POA: Diagnosis not present

## 2021-06-20 DIAGNOSIS — M160 Bilateral primary osteoarthritis of hip: Secondary | ICD-10-CM | POA: Diagnosis not present

## 2021-06-20 LAB — CBC WITH DIFFERENTIAL/PLATELET
Abs Immature Granulocytes: 0.03 10*3/uL (ref 0.00–0.07)
Basophils Absolute: 0 10*3/uL (ref 0.0–0.1)
Basophils Relative: 1 %
Eosinophils Absolute: 0.1 10*3/uL (ref 0.0–0.5)
Eosinophils Relative: 2 %
HCT: 36.2 % (ref 36.0–46.0)
Hemoglobin: 11.1 g/dL — ABNORMAL LOW (ref 12.0–15.0)
Immature Granulocytes: 0 %
Lymphocytes Relative: 18 %
Lymphs Abs: 1.4 10*3/uL (ref 0.7–4.0)
MCH: 26.9 pg (ref 26.0–34.0)
MCHC: 30.7 g/dL (ref 30.0–36.0)
MCV: 87.7 fL (ref 80.0–100.0)
Monocytes Absolute: 0.5 10*3/uL (ref 0.1–1.0)
Monocytes Relative: 6 %
Neutro Abs: 5.6 10*3/uL (ref 1.7–7.7)
Neutrophils Relative %: 73 %
Platelets: 339 10*3/uL (ref 150–400)
RBC: 4.13 MIL/uL (ref 3.87–5.11)
RDW: 13.7 % (ref 11.5–15.5)
WBC: 7.6 10*3/uL (ref 4.0–10.5)
nRBC: 0 % (ref 0.0–0.2)

## 2021-06-20 LAB — COMPREHENSIVE METABOLIC PANEL
ALT: 15 U/L (ref 0–44)
AST: 18 U/L (ref 15–41)
Albumin: 3.7 g/dL (ref 3.5–5.0)
Alkaline Phosphatase: 43 U/L (ref 38–126)
Anion gap: 11 (ref 5–15)
BUN: 23 mg/dL (ref 8–23)
CO2: 27 mmol/L (ref 22–32)
Calcium: 9.1 mg/dL (ref 8.9–10.3)
Chloride: 97 mmol/L — ABNORMAL LOW (ref 98–111)
Creatinine, Ser: 1.22 mg/dL — ABNORMAL HIGH (ref 0.44–1.00)
GFR, Estimated: 40 mL/min — ABNORMAL LOW (ref >60)
Glucose, Bld: 129 mg/dL — ABNORMAL HIGH (ref 70–99)
Potassium: 4.2 mmol/L (ref 3.5–5.1)
Sodium: 135 mmol/L (ref 135–145)
Total Bilirubin: 0.8 mg/dL (ref 0.3–1.2)
Total Protein: 7.2 g/dL (ref 6.5–8.1)

## 2021-06-20 LAB — URINALYSIS, ROUTINE W REFLEX MICROSCOPIC
Bilirubin Urine: NEGATIVE
Glucose, UA: NEGATIVE mg/dL
Ketones, ur: NEGATIVE mg/dL
Leukocytes,Ua: NEGATIVE
Nitrite: NEGATIVE
Protein, ur: NEGATIVE mg/dL
Specific Gravity, Urine: 1.015 (ref 1.005–1.030)
pH: 6.5 (ref 5.0–8.0)

## 2021-06-20 LAB — URINALYSIS, MICROSCOPIC (REFLEX)
Bacteria, UA: NONE SEEN
RBC / HPF: >50 RBC/hpf (ref 0–5)

## 2021-06-20 LAB — POC OCCULT BLOOD, ED: Fecal Occult Bld: NEGATIVE

## 2021-06-20 MED ORDER — BISACODYL 10 MG RE SUPP
10.0000 mg | Freq: Once | RECTAL | Status: AC
  Administered 2021-06-20: 10 mg via RECTAL
  Filled 2021-06-20: qty 1

## 2021-06-20 MED ORDER — MAGNESIUM CITRATE PO SOLN: ORAL | 0 refills | Status: DC

## 2021-06-20 NOTE — Discharge Instructions (Signed)
As discussed, drink one half of the bottle of magnesium citrate.  If after 20 minutes you are not having cramping or a bowel movement, drink the other half.  I also want you to continue taking your MiraLAX daily as discussed to help keep your stools softer.  This medicine can be taken daily without any complications or side effects. ?

## 2021-06-20 NOTE — ED Triage Notes (Signed)
Patient from home due to constipation for 5 days. Has tried some suppositories without relief.  ?

## 2021-06-20 NOTE — ED Provider Notes (Signed)
?[facility] ?Provider Note ? ? ?CSN: [id number] ?Arrival date & time: 06/20/21  2440 ? ?  ? ?History ? ?Chief Complaint  ?Patient presents with  ? Constipation  ? ? ?Regina Good is a 88 y.o. female with a history significant for type 2 diabetes, hypertension, CAD with history of MI, GERD and chronic kidney disease stage III presenting for evaluation of a 5-day history of constipation.  She states she typically has very regular has several bowel movements per day but has not had a bowel movement in 5 days.  She denies abdominal pain, she states that she has the urge to have a bowel movement but when she tries to go despite straining she is unable to have a bowel movement.  She denies rectal pain, she denies abdominal pain or distention, no nausea or vomiting and has been eating meals.  Denies fevers or chills.  She did have a recent UTI and has completed a course of antibiotics. ? ?The history is provided by the patient and a caregiver Freight forwarder at bedside).  ? ?  ? ?Home Medications ?Prior to Admission medications   ?Medication Sig Start Date End Date Taking? Authorizing Provider  ?acetaminophen (TYLENOL) 325 MG tablet Take 2 tablets (650 mg total) by mouth every 6 (six) hours as needed for mild pain (or Fever >/= 101). 09/26/19  Yes Emokpae, Courage, MD  ?ALPRAZolam (XANAX) 0.25 MG tablet Take 1 tablet (0.25 mg total) by mouth at bedtime. 09/26/19  Yes Roxan Hockey, MD  ?aspirin 81 MG chewable tablet Chew 1 tablet (81 mg total) by mouth daily with breakfast. 09/26/19  Yes Emokpae, Courage, MD  ?atorvastatin (LIPITOR) 20 MG tablet Take 1 tablet (20 mg total) by mouth daily. 09/26/19  Yes Roxan Hockey, MD  ?clopidogrel (PLAVIX) 75 MG tablet Take 1 tablet (75 mg total) by mouth daily. 05/02/21  Yes Allie Bossier, MD  ?furosemide (LASIX) 40 MG tablet Take 1 tablet (40 mg total) by mouth every Monday, Wednesday, and Friday. 09/28/19  Yes Roxan Hockey, MD  ?magnesium citrate SOLN As directed.  06/20/21  Yes Flint Hakeem, Almyra Free, PA-C  ?metFORMIN (GLUCOPHAGE) 500 MG tablet Take 500 mg by mouth daily. 05/09/21  Yes [provider]  ?metoprolol succinate (TOPROL-XL) 25 MG 24 hr tablet Take 1 tablet (25 mg total) by mouth daily. 09/27/19  Yes Roxan Hockey, MD  ?omeprazole (PRILOSEC) 20 MG capsule Take 1 capsule (20 mg total) by mouth daily. 09/26/19  Yes Roxan Hockey, MD  ?spironolactone (ALDACTONE) 25 MG tablet Take 0.5 tablets (12.5 mg total) by mouth daily. 09/27/19  Yes Emokpae, Courage, MD  ?sucralfate (CARAFATE) 1 g tablet Take 1 g by mouth 2 (two) times daily. 02/14/21  Yes [provider]  ?bismuth subsalicylate (PEPTO BISMOL) 262 MG/15ML suspension Take 30 mLs by mouth every 6 (six) hours as needed.    [provider]  ?   ? ?Allergies    ?Codeine and Sulfonamide derivatives   ? ?Review of Systems   ?Review of Systems  ?Constitutional:  Negative for appetite change, chills and fever.  ?HENT:  Negative for congestion.   ?Eyes: Negative.   ?Respiratory:  Negative for chest tightness and shortness of breath.   ?Cardiovascular:  Negative for chest pain.  ?Gastrointestinal:  Positive for constipation. Negative for abdominal distention, abdominal pain, nausea and vomiting.  ?     Reports she had abdominal pain when she was diagnosed with a UTI, this has resolved after being on antibiotics  ?Genitourinary:  Negative.  Negative for dysuria.  ?Skin: Negative.  Negative for rash and wound.  ?Neurological:  Negative for dizziness, weakness, light-headedness, numbness and headaches.  ?Psychiatric/Behavioral: Negative.    ? ?Physical Exam ?Updated Vital Signs ?BP (!) 164/86 (BP Location: Right Arm)   Pulse 70   Temp (!) 97.5 ?F (36.4 ?C) (Oral)   Resp 18   Ht '5\' 5"'$  (1.651 m)   Wt 48.1 kg   SpO2 98%   BMI 17.64 kg/m?  ?Physical Exam ?Vitals and nursing note reviewed. Exam conducted with a chaperone present.  ?Constitutional:   ?   Appearance: She is well-developed.  ?   Comments: Hard of  hearing  ?HENT:  ?   Head: Normocephalic and atraumatic.  ?Eyes:  ?   Conjunctiva/sclera: Conjunctivae normal.  ?Cardiovascular:  ?   Rate and Rhythm: Normal rate and regular rhythm.  ?   Heart sounds: Normal heart sounds.  ?Pulmonary:  ?   Effort: Pulmonary effort is normal.  ?   Breath sounds: Normal breath sounds. No wheezing.  ?Abdominal:  ?   General: Bowel sounds are increased. There is no distension.  ?   Palpations: Abdomen is soft.  ?   Tenderness: There is no abdominal tenderness. There is no guarding or rebound.  ?Genitourinary: ?   Rectum: Normal. Guaiac result negative. No mass.  ?   Comments: No stool in the rectal vault. ?Musculoskeletal:     ?   General: Normal range of motion.  ?   Cervical back: Normal range of motion.  ?Skin: ?   General: Skin is warm and dry.  ?Neurological:  ?   Mental Status: She is alert.  ? ? ?ED Results / Procedures / Treatments   ?Labs ?(all labs ordered are listed, but only abnormal results are displayed) ?Labs Reviewed  ?CBC WITH DIFFERENTIAL/PLATELET - Abnormal; Notable for the following components:  ?    Result Value  ? Hemoglobin 11.1 (*)   ? All other components within normal limits  ?COMPREHENSIVE METABOLIC PANEL - Abnormal; Notable for the following components:  ? Chloride 97 (*)   ? Glucose, Bld 129 (*)   ? Creatinine, Ser 1.22 (*)   ? GFR, Estimated 40 (*)   ? All other components within normal limits  ?URINALYSIS, ROUTINE W REFLEX MICROSCOPIC - Abnormal; Notable for the following components:  ? Hgb urine dipstick MODERATE (*)   ? All other components within normal limits  ?URINALYSIS, MICROSCOPIC (REFLEX)  ?POC OCCULT BLOOD, ED  ? ? ?EKG ?None ? ?Radiology ?DG Abd 2 Views ? ?Result Date: 06/20/2021 ?CLINICAL DATA:  Constipation. EXAM: ABDOMEN - 2 VIEW COMPARISON:  Abdominal radiographs 06/09/2016 FINDINGS: A moderate amount of stool is present in the colon. Scattered gas is present in nondilated loops of small bowel without evidence of obstruction. Atherosclerotic  vascular calcification, pelvic phleboliths, and degenerative changes in the lumbar spine and right greater than left hips are noted. No consolidation is evident in the lung bases. IMPRESSION: Moderate colonic stool burden.  No evidence of bowel obstruction. Electronically Signed   By: Logan Bores M.D.   On: 06/20/2021 11:18   ? ?Procedures ?Procedures  ? ? ?Medications Ordered in ED ?Medications  ?bisacodyl (DULCOLAX) suppository 10 mg (10 mg Rectal Given 06/20/21 1218)  ? ? ?ED Course/ Medical Decision Making/ A&P ?  ?                        ?Medical Decision Making ?Pt  with moderate constipation without signs of obstruction or impaction.  Labs reassuring, potassium normal range. She was given a dulcolax suppository here with plans to try magnesium citrate once home to stimulate bm.  She was just started on miralax as well, has had 2 doses - explained how this works and takes several days to see results - encouraged to continue taking this daily to keep stools soft, even she is able to pass this current stool burden.  Return precautions outlined.  Plan agreeable with pt and caregiver at bedside.   ? ?Discussed with Dr. Regenia Skeeter prior to dc home. ? ?Amount and/or Complexity of Data Reviewed ?Independent Historian: caregiver ?Labs: ordered. ?   Details: normal potassium ?Radiology: ordered and independent interpretation performed. ?   Details: constipation without impaction or obstructive pattern ? ?Risk ?OTC drugs. ? ? ? ? ? ? ? ? ? ? ?Final Clinical Impression(s) / ED Diagnoses ?Final diagnoses:  ?Constipation, unspecified constipation type  ? ? ?Rx / DC Orders ?ED Discharge Orders   ? ?      Ordered  ?  magnesium citrate SOLN       ? 06/20/21 1225  ? ?  ?  ? ?  ? ? ?  ?Evalee Jefferson, PA-C ?06/21/21 1214 ? ?  ?Sherwood Gambler, MD ?06/21/21 1304 ? ?

## 2021-06-25 DIAGNOSIS — R350 Frequency of micturition: Secondary | ICD-10-CM | POA: Diagnosis not present

## 2021-08-21 DIAGNOSIS — E440 Moderate protein-calorie malnutrition: Secondary | ICD-10-CM | POA: Diagnosis not present

## 2021-08-21 DIAGNOSIS — K582 Mixed irritable bowel syndrome: Secondary | ICD-10-CM | POA: Diagnosis not present

## 2021-08-29 ENCOUNTER — Emergency Department (HOSPITAL_COMMUNITY): Payer: Medicare Other

## 2021-08-29 ENCOUNTER — Encounter (HOSPITAL_COMMUNITY): Payer: Self-pay | Admitting: *Deleted

## 2021-08-29 ENCOUNTER — Inpatient Hospital Stay (HOSPITAL_COMMUNITY)
Admission: EM | Admit: 2021-08-29 | Discharge: 2021-09-01 | DRG: 687 | Disposition: A | Discharge status: Home / Self Care | Payer: Medicare Other | Attending: Family Medicine | Admitting: Family Medicine

## 2021-08-29 ENCOUNTER — Other Ambulatory Visit: Payer: Self-pay

## 2021-08-29 DIAGNOSIS — K219 Gastro-esophageal reflux disease without esophagitis: Secondary | ICD-10-CM | POA: Diagnosis not present

## 2021-08-29 DIAGNOSIS — I700 Atherosclerosis of aorta: Secondary | ICD-10-CM | POA: Diagnosis not present

## 2021-08-29 DIAGNOSIS — N1832 Chronic kidney disease, stage 3b: Secondary | ICD-10-CM | POA: Diagnosis present

## 2021-08-29 DIAGNOSIS — Z7984 Long term (current) use of oral hypoglycemic drugs: Secondary | ICD-10-CM

## 2021-08-29 DIAGNOSIS — Z85820 Personal history of malignant melanoma of skin: Secondary | ICD-10-CM | POA: Diagnosis not present

## 2021-08-29 DIAGNOSIS — C642 Malignant neoplasm of left kidney, except renal pelvis: Secondary | ICD-10-CM | POA: Diagnosis not present

## 2021-08-29 DIAGNOSIS — C772 Secondary and unspecified malignant neoplasm of intra-abdominal lymph nodes: Secondary | ICD-10-CM | POA: Diagnosis present

## 2021-08-29 DIAGNOSIS — I429 Cardiomyopathy, unspecified: Secondary | ICD-10-CM | POA: Diagnosis not present

## 2021-08-29 DIAGNOSIS — Z7902 Long term (current) use of antithrombotics/antiplatelets: Secondary | ICD-10-CM

## 2021-08-29 DIAGNOSIS — D62 Acute posthemorrhagic anemia: Secondary | ICD-10-CM | POA: Diagnosis not present

## 2021-08-29 DIAGNOSIS — Z955 Presence of coronary angioplasty implant and graft: Secondary | ICD-10-CM

## 2021-08-29 DIAGNOSIS — K5732 Diverticulitis of large intestine without perforation or abscess without bleeding: Secondary | ICD-10-CM | POA: Diagnosis present

## 2021-08-29 DIAGNOSIS — D631 Anemia in chronic kidney disease: Secondary | ICD-10-CM | POA: Diagnosis not present

## 2021-08-29 DIAGNOSIS — Z800 Family history of malignant neoplasm of digestive organs: Secondary | ICD-10-CM

## 2021-08-29 DIAGNOSIS — I129 Hypertensive chronic kidney disease with stage 1 through stage 4 chronic kidney disease, or unspecified chronic kidney disease: Secondary | ICD-10-CM | POA: Diagnosis not present

## 2021-08-29 DIAGNOSIS — K5900 Constipation, unspecified: Secondary | ICD-10-CM | POA: Diagnosis not present

## 2021-08-29 DIAGNOSIS — R310 Gross hematuria: Secondary | ICD-10-CM | POA: Diagnosis present

## 2021-08-29 DIAGNOSIS — I2510 Atherosclerotic heart disease of native coronary artery without angina pectoris: Secondary | ICD-10-CM | POA: Diagnosis present

## 2021-08-29 DIAGNOSIS — E871 Hypo-osmolality and hyponatremia: Secondary | ICD-10-CM | POA: Diagnosis present

## 2021-08-29 DIAGNOSIS — Z66 Do not resuscitate: Secondary | ICD-10-CM | POA: Diagnosis not present

## 2021-08-29 DIAGNOSIS — K5792 Diverticulitis of intestine, part unspecified, without perforation or abscess without bleeding: Secondary | ICD-10-CM | POA: Diagnosis not present

## 2021-08-29 DIAGNOSIS — Z7982 Long term (current) use of aspirin: Secondary | ICD-10-CM | POA: Diagnosis not present

## 2021-08-29 DIAGNOSIS — Z8249 Family history of ischemic heart disease and other diseases of the circulatory system: Secondary | ICD-10-CM

## 2021-08-29 DIAGNOSIS — Z79899 Other long term (current) drug therapy: Secondary | ICD-10-CM | POA: Diagnosis not present

## 2021-08-29 DIAGNOSIS — Z818 Family history of other mental and behavioral disorders: Secondary | ICD-10-CM

## 2021-08-29 DIAGNOSIS — Z823 Family history of stroke: Secondary | ICD-10-CM

## 2021-08-29 DIAGNOSIS — Z833 Family history of diabetes mellitus: Secondary | ICD-10-CM | POA: Diagnosis not present

## 2021-08-29 DIAGNOSIS — R1032 Left lower quadrant pain: Secondary | ICD-10-CM | POA: Diagnosis not present

## 2021-08-29 DIAGNOSIS — E785 Hyperlipidemia, unspecified: Secondary | ICD-10-CM | POA: Diagnosis not present

## 2021-08-29 DIAGNOSIS — Z882 Allergy status to sulfonamides status: Secondary | ICD-10-CM

## 2021-08-29 DIAGNOSIS — R319 Hematuria, unspecified: Secondary | ICD-10-CM | POA: Diagnosis not present

## 2021-08-29 DIAGNOSIS — E1122 Type 2 diabetes mellitus with diabetic chronic kidney disease: Secondary | ICD-10-CM | POA: Diagnosis not present

## 2021-08-29 DIAGNOSIS — N2889 Other specified disorders of kidney and ureter: Secondary | ICD-10-CM

## 2021-08-29 DIAGNOSIS — Z885 Allergy status to narcotic agent status: Secondary | ICD-10-CM

## 2021-08-29 LAB — CBC WITH DIFFERENTIAL/PLATELET
Abs Immature Granulocytes: 0.03 10*3/uL (ref 0.00–0.07)
Basophils Absolute: 0 10*3/uL (ref 0.0–0.1)
Basophils Relative: 1 %
Eosinophils Absolute: 0.4 10*3/uL (ref 0.0–0.5)
Eosinophils Relative: 5 %
HCT: 35.2 % — ABNORMAL LOW (ref 36.0–46.0)
Hemoglobin: 11.2 g/dL — ABNORMAL LOW (ref 12.0–15.0)
Immature Granulocytes: 0 %
Lymphocytes Relative: 23 %
Lymphs Abs: 1.8 10*3/uL (ref 0.7–4.0)
MCH: 27.1 pg (ref 26.0–34.0)
MCHC: 31.8 g/dL (ref 30.0–36.0)
MCV: 85.2 fL (ref 80.0–100.0)
Monocytes Absolute: 0.6 10*3/uL (ref 0.1–1.0)
Monocytes Relative: 7 %
Neutro Abs: 5.1 10*3/uL (ref 1.7–7.7)
Neutrophils Relative %: 64 %
Platelets: 380 10*3/uL (ref 150–400)
RBC: 4.13 MIL/uL (ref 3.87–5.11)
RDW: 14.3 % (ref 11.5–15.5)
WBC: 8 10*3/uL (ref 4.0–10.5)
nRBC: 0 % (ref 0.0–0.2)

## 2021-08-29 LAB — URINALYSIS, ROUTINE W REFLEX MICROSCOPIC
Bacteria, UA: NONE SEEN
Bilirubin Urine: NEGATIVE
Glucose, UA: NEGATIVE mg/dL
Ketones, ur: NEGATIVE mg/dL
Nitrite: NEGATIVE
Protein, ur: 100 mg/dL — AB
RBC / HPF: >50 RBC/hpf — ABNORMAL HIGH (ref 0–5)
Specific Gravity, Urine: 1.028 (ref 1.005–1.030)
WBC, UA: >50 WBC/hpf — ABNORMAL HIGH (ref 0–5)
pH: 7 (ref 5.0–8.0)

## 2021-08-29 LAB — GLUCOSE, CAPILLARY
Glucose-Capillary: 154 mg/dL — ABNORMAL HIGH (ref 70–99)
Glucose-Capillary: 143 mg/dL — ABNORMAL HIGH (ref 70–99)

## 2021-08-29 LAB — LIPASE, BLOOD: Lipase: 44 U/L (ref 11–51)

## 2021-08-29 LAB — COMPREHENSIVE METABOLIC PANEL
ALT: 11 U/L (ref 0–44)
AST: 16 U/L (ref 15–41)
Albumin: 3.6 g/dL (ref 3.5–5.0)
Alkaline Phosphatase: 52 U/L (ref 38–126)
Anion gap: 8 (ref 5–15)
BUN: 34 mg/dL — ABNORMAL HIGH (ref 8–23)
CO2: 28 mmol/L (ref 22–32)
Calcium: 8.7 mg/dL — ABNORMAL LOW (ref 8.9–10.3)
Chloride: 94 mmol/L — ABNORMAL LOW (ref 98–111)
Creatinine, Ser: 1.18 mg/dL — ABNORMAL HIGH (ref 0.44–1.00)
GFR, Estimated: 41 mL/min — ABNORMAL LOW (ref >60)
Glucose, Bld: 120 mg/dL — ABNORMAL HIGH (ref 70–99)
Potassium: 4.1 mmol/L (ref 3.5–5.1)
Sodium: 130 mmol/L — ABNORMAL LOW (ref 135–145)
Total Bilirubin: 0.7 mg/dL (ref 0.3–1.2)
Total Protein: 7.5 g/dL (ref 6.5–8.1)

## 2021-08-29 LAB — URINALYSIS, MICROSCOPIC (REFLEX)
Bacteria, UA: NONE SEEN
RBC / HPF: >50 RBC/hpf (ref 0–5)
Squamous Epithelial / HPF: NONE SEEN (ref 0–5)
WBC, UA: NONE SEEN WBC/hpf (ref 0–5)

## 2021-08-29 LAB — PROTIME-INR
INR: 1 (ref 0.8–1.2)
Prothrombin Time: 12.9 seconds (ref 11.4–15.2)

## 2021-08-29 MED ORDER — SODIUM CHLORIDE 0.9 % IV SOLN: 3.0000 g | Freq: Four times a day (QID) | INTRAVENOUS | Status: DC

## 2021-08-29 MED ORDER — SODIUM CHLORIDE 0.9 % IV SOLN: 1.5000 g | Freq: Four times a day (QID) | INTRAVENOUS | Status: DC

## 2021-08-29 MED ORDER — AMPICILLIN-SULBACTAM SODIUM 1.5 (1-0.5) G IJ SOLR
1.5000 g | Freq: Two times a day (BID) | INTRAMUSCULAR | Status: DC
Start: 2021-08-29 — End: 2021-09-01
  Administered 2021-08-29 – 2021-08-31 (×5): 1.5 g via INTRAVENOUS
  Filled 2021-08-29 (×9): qty 4

## 2021-08-29 MED ORDER — SODIUM CHLORIDE 0.9% FLUSH: 3.0000 mL | INTRAVENOUS | Status: DC | PRN

## 2021-08-29 MED ORDER — HEPARIN SODIUM (PORCINE) 5000 UNIT/ML IJ SOLN: 5000.0000 [IU] | Freq: Three times a day (TID) | INTRAMUSCULAR | Status: DC

## 2021-08-29 MED ORDER — MORPHINE SULFATE (PF) 4 MG/ML IV SOLN
2.0000 mg | Freq: Once | INTRAVENOUS | Status: AC
  Administered 2021-08-29: 2 mg via INTRAVENOUS
  Filled 2021-08-29: qty 1

## 2021-08-29 MED ORDER — PANTOPRAZOLE SODIUM 40 MG PO TBEC
40.0000 mg | DELAYED_RELEASE_TABLET | Freq: Every day | ORAL | Status: DC
  Administered 2021-08-30 – 2021-09-01 (×3): 40 mg via ORAL
  Filled 2021-08-29 (×3): qty 1

## 2021-08-29 MED ORDER — ASPIRIN 81 MG PO TBEC: 81.0000 mg | DELAYED_RELEASE_TABLET | Freq: Every day | ORAL | Status: DC

## 2021-08-29 MED ORDER — SODIUM CHLORIDE 0.9% FLUSH
3.0000 mL | Freq: Two times a day (BID) | INTRAVENOUS | Status: DC
  Administered 2021-08-29 – 2021-09-01 (×5): 3 mL via INTRAVENOUS

## 2021-08-29 MED ORDER — BISACODYL 10 MG RE SUPP: 10.0000 mg | Freq: Every day | RECTAL | Status: DC | PRN

## 2021-08-29 MED ORDER — SODIUM CHLORIDE 0.9% FLUSH
3.0000 mL | Freq: Two times a day (BID) | INTRAVENOUS | Status: DC
  Administered 2021-08-30 (×2): 3 mL via INTRAVENOUS

## 2021-08-29 MED ORDER — SODIUM CHLORIDE 0.9 % IV SOLN: INTRAVENOUS | Status: DC | PRN

## 2021-08-29 MED ORDER — ONDANSETRON HCL 4 MG/2ML IJ SOLN
4.0000 mg | Freq: Once | INTRAMUSCULAR | Status: AC
  Administered 2021-08-29: 4 mg via INTRAVENOUS
  Filled 2021-08-29: qty 2

## 2021-08-29 MED ORDER — HYDRALAZINE HCL 20 MG/ML IJ SOLN: 10.0000 mg | Freq: Four times a day (QID) | INTRAMUSCULAR | Status: DC | PRN

## 2021-08-29 MED ORDER — ONDANSETRON HCL 4 MG/2ML IJ SOLN
4.0000 mg | Freq: Four times a day (QID) | INTRAMUSCULAR | Status: DC | PRN
  Administered 2021-08-29: 4 mg via INTRAVENOUS
  Filled 2021-08-29: qty 2

## 2021-08-29 MED ORDER — METOPROLOL SUCCINATE ER 25 MG PO TB24
25.0000 mg | ORAL_TABLET | Freq: Every day | ORAL | Status: DC
  Administered 2021-08-29 – 2021-09-01 (×4): 25 mg via ORAL
  Filled 2021-08-29 (×4): qty 1

## 2021-08-29 MED ORDER — FENTANYL CITRATE PF 50 MCG/ML IJ SOSY
25.0000 ug | PREFILLED_SYRINGE | INTRAMUSCULAR | Status: DC | PRN
  Administered 2021-08-29: 25 ug via INTRAVENOUS
  Filled 2021-08-29: qty 1

## 2021-08-29 MED ORDER — SODIUM CHLORIDE 0.9 % IV SOLN
1.5000 g | Freq: Once | INTRAVENOUS | Status: AC
  Administered 2021-08-29: 1.5 g via INTRAVENOUS
  Filled 2021-08-29: qty 4

## 2021-08-29 MED ORDER — IOHEXOL 300 MG/ML  SOLN
75.0000 mL | Freq: Once | INTRAMUSCULAR | Status: AC | PRN
  Administered 2021-08-29: 75 mL via INTRAVENOUS

## 2021-08-29 MED ORDER — PROCHLORPERAZINE 25 MG RE SUPP
25.0000 mg | Freq: Once | RECTAL | Status: AC
  Administered 2021-08-29: 25 mg via RECTAL
  Filled 2021-08-29: qty 1

## 2021-08-29 MED ORDER — BISACODYL 10 MG RE SUPP
10.0000 mg | Freq: Once | RECTAL | Status: AC
  Administered 2021-08-29: 10 mg via RECTAL
  Filled 2021-08-29: qty 1

## 2021-08-29 MED ORDER — ALPRAZOLAM 0.25 MG PO TABS
0.2500 mg | ORAL_TABLET | Freq: Every day | ORAL | Status: DC
  Administered 2021-08-29 – 2021-08-31 (×3): 0.25 mg via ORAL
  Filled 2021-08-29 (×3): qty 1

## 2021-08-29 MED ORDER — MORPHINE SULFATE (PF) 4 MG/ML IV SOLN
4.0000 mg | Freq: Once | INTRAVENOUS | Status: AC
  Administered 2021-08-29: 4 mg via INTRAVENOUS
  Filled 2021-08-29: qty 1

## 2021-08-29 MED ORDER — TRAZODONE HCL 50 MG PO TABS
50.0000 mg | ORAL_TABLET | Freq: Every evening | ORAL | Status: DC | PRN
  Administered 2021-08-31 – 2021-09-01 (×2): 50 mg via ORAL
  Filled 2021-08-29 (×2): qty 1

## 2021-08-29 MED ORDER — ACETAMINOPHEN 325 MG PO TABS
650.0000 mg | ORAL_TABLET | Freq: Four times a day (QID) | ORAL | Status: DC | PRN
  Filled 2021-08-29: qty 2

## 2021-08-29 MED ORDER — OXYCODONE HCL 5 MG PO TABS: 5.0000 mg | ORAL_TABLET | ORAL | Status: DC | PRN

## 2021-08-29 MED ORDER — INSULIN ASPART 100 UNIT/ML IJ SOLN
0.0000 [IU] | Freq: Three times a day (TID) | INTRAMUSCULAR | Status: DC
  Administered 2021-08-30: 1 [IU] via SUBCUTANEOUS
  Administered 2021-08-31: 2 [IU] via SUBCUTANEOUS

## 2021-08-29 MED ORDER — SODIUM CHLORIDE 0.9 % IV SOLN: INTRAVENOUS | Status: DC

## 2021-08-29 MED ORDER — POLYETHYLENE GLYCOL 3350 17 G PO PACK
17.0000 g | PACK | Freq: Two times a day (BID) | ORAL | Status: DC
  Administered 2021-08-30 – 2021-09-01 (×5): 17 g via ORAL
  Filled 2021-08-29 (×5): qty 1

## 2021-08-29 MED ORDER — LACTULOSE 10 GM/15ML PO SOLN
30.0000 g | Freq: Once | ORAL | Status: AC
  Administered 2021-08-29: 30 g via ORAL
  Filled 2021-08-29: qty 60

## 2021-08-29 MED ORDER — KETOROLAC TROMETHAMINE 15 MG/ML IJ SOLN
15.0000 mg | Freq: Three times a day (TID) | INTRAMUSCULAR | Status: AC
Start: 2021-08-29 — End: 2021-08-30
  Administered 2021-08-29 – 2021-08-30 (×3): 15 mg via INTRAVENOUS
  Filled 2021-08-29 (×3): qty 1

## 2021-08-29 MED ORDER — ACETAMINOPHEN 650 MG RE SUPP: 650.0000 mg | Freq: Four times a day (QID) | RECTAL | Status: DC | PRN

## 2021-08-29 MED ORDER — INSULIN ASPART 100 UNIT/ML IJ SOLN: 0.0000 [IU] | Freq: Every day | INTRAMUSCULAR | Status: DC

## 2021-08-29 NOTE — ED Notes (Signed)
Attempt to pass foley catheter was unsuccessful.  Will have another staff member try.

## 2021-08-29 NOTE — ED Provider Notes (Signed)
[facility] Provider Note  CSN: [id number] Arrival date & time: 08/29/21 6269  Chief Complaint(s) Hematuria  HPI Regina Good is a 88 y.o. female with PMH CAD, T2DM, GERD, HTN, HLD who presents emergency department for evaluation of hematuria and abdominal pain.  Patient states that over the last 3 days she has had gradually worsening lower abdominal pain.  She endorses nausea but denies vomiting.  States that she saw bright red blood in her urine but denies hematochezia.  Currently denies chest pain, shortness of breath, headache, fever or other systemic symptoms.  She states this is a new problem for her.   Past Medical History Past Medical History:  Diagnosis Date   Anxiety    CAD (coronary artery disease), native coronary artery    Diabetes mellitus    Family history of diabetes mellitus    GERD (gastroesophageal reflux disease)    History of melanoma    Hyperlipidemia    Hypertension    Patient Active Problem List   Diagnosis Date Noted   Diverticulitis 08/29/2021   Chest pain 05/01/2021   Cardiomyopathy ([place]) 09/25/2019   Nausea 09/24/2019   Dizziness 09/24/2019   ACUTE CYSTITIS 07/27/2008   ALLERGIC RHINITIS 05/22/2008   CHRONIC KIDNEY DISEASE STAGE III (MODERATE) 02/18/2008   Type 2 diabetes mellitus with stage 3 chronic kidney disease ([place]) 02/04/2008   HLD (hyperlipidemia) 02/04/2008   Anxiety state 02/04/2008   Essential hypertension 02/04/2008   MYOCARDIAL INFARCTION, HX OF 02/04/2008   CAD (coronary artery disease), native coronary artery 02/04/2008   GERD 02/04/2008   CAROTID BRUIT, RIGHT 02/04/2008   MELANOMA, HX OF 02/04/2008   Home Medication(s) Prior to Admission medications   Medication Sig Start Date End Date Taking? Authorizing Provider  acetaminophen (TYLENOL) 325 MG tablet Take 2 tablets (650 mg total) by mouth every 6 (six) hours as needed for mild pain (or Fever >/= 101). 09/26/19   Emokpae, Courage, MD  ALPRAZolam (XANAX)  0.25 MG tablet Take 1 tablet (0.25 mg total) by mouth at bedtime. 09/26/19   Roxan Hockey, MD  aspirin 81 MG chewable tablet Chew 1 tablet (81 mg total) by mouth daily with breakfast. 09/26/19   Denton Brick, Courage, MD  atorvastatin (LIPITOR) 20 MG tablet Take 1 tablet (20 mg total) by mouth daily. 09/26/19   Roxan Hockey, MD  bismuth subsalicylate (PEPTO BISMOL) 262 MG/15ML suspension Take 30 mLs by mouth every 6 (six) hours as needed.    [provider]  clopidogrel (PLAVIX) 75 MG tablet Take 1 tablet (75 mg total) by mouth daily. 05/02/21   Allie Bossier, MD  furosemide (LASIX) 40 MG tablet Take 1 tablet (40 mg total) by mouth every Monday, Wednesday, and Friday. 09/28/19   Roxan Hockey, MD  magnesium citrate SOLN As directed. 06/20/21   Evalee Jefferson, PA-C  metFORMIN (GLUCOPHAGE) 500 MG tablet Take 500 mg by mouth daily. 05/09/21   [provider]  metoprolol succinate (TOPROL-XL) 25 MG 24 hr tablet Take 1 tablet (25 mg total) by mouth daily. 09/27/19   Roxan Hockey, MD  omeprazole (PRILOSEC) 20 MG capsule Take 1 capsule (20 mg total) by mouth daily. 09/26/19   Roxan Hockey, MD  spironolactone (ALDACTONE) 25 MG tablet Take 0.5 tablets (12.5 mg total) by mouth daily. 09/27/19   Roxan Hockey, MD  sucralfate (CARAFATE) 1 g tablet Take 1 g by mouth 2 (two) times daily. 02/14/21   [provider]  Past Surgical History Past Surgical History:  Procedure Laterality Date   APPENDECTOMY     CORONARY STENT PLACEMENT  2004   NM MYOVIEW LTD  2010   RENAL ARTERY STENT  2006   Family History Family History  Problem Relation Age of Onset   Pancreatic cancer Mother    Coronary artery disease Father    Diabetes Brother    Colon cancer Sister    Stroke Sister    Stroke Sister    Diabetes Son    Stroke Son    Schizophrenia Daughter      Social History Social History   Tobacco Use   Smoking status: Never   Smokeless tobacco: Never  Vaping Use   Vaping Use: Never used  Substance Use Topics   Alcohol use: No   Drug use: No   Allergies Codeine and Sulfonamide derivatives  Review of Systems Review of Systems  Gastrointestinal:  Positive for abdominal pain and nausea.  Genitourinary:  Positive for dysuria and hematuria.    Physical Exam Vital Signs  I have reviewed the triage vital signs BP (!) 187/67   Pulse 74   Temp 97.8 F (36.6 C) (Oral)   Resp 18   Ht '5\' 5"'$  (1.651 m)   Wt 47.6 kg   SpO2 98%   BMI 17.47 kg/m   Physical Exam Vitals and nursing note reviewed.  Constitutional:      General: She is not in acute distress.    Appearance: She is well-developed.  HENT:     Head: Normocephalic and atraumatic.  Eyes:     Conjunctiva/sclera: Conjunctivae normal.  Cardiovascular:     Rate and Rhythm: Normal rate and regular rhythm.     Heart sounds: No murmur heard. Pulmonary:     Effort: Pulmonary effort is normal. No respiratory distress.     Breath sounds: Normal breath sounds.  Abdominal:     Palpations: Abdomen is soft.     Tenderness: There is abdominal tenderness.  Musculoskeletal:        General: No swelling.     Cervical back: Neck supple.  Skin:    General: Skin is warm and dry.     Capillary Refill: Capillary refill takes less than 2 seconds.  Neurological:     Mental Status: She is alert.  Psychiatric:        Mood and Affect: Mood normal.     ED Results and Treatments Labs (all labs ordered are listed, but only abnormal results are displayed) Labs Reviewed  CBC WITH DIFFERENTIAL/PLATELET - Abnormal; Notable for the following components:      Result Value   Hemoglobin 11.2 (*)    HCT 35.2 (*)    All other components within normal limits  COMPREHENSIVE METABOLIC PANEL - Abnormal; Notable for the following components:   Sodium 130 (*)    Chloride 94 (*)    Glucose, Bld  120 (*)    BUN 34 (*)    Creatinine, Ser 1.18 (*)    Calcium 8.7 (*)    GFR, Estimated 41 (*)    All other components within normal limits  URINALYSIS, ROUTINE W REFLEX MICROSCOPIC - Abnormal; Notable for the following components:   Color, Urine RED (*)    APPearance TURBID (*)    Glucose, UA   (*)    Value: TEST NOT REPORTED DUE TO COLOR INTERFERENCE OF URINE PIGMENT   Hgb urine dipstick   (*)    Value: TEST NOT REPORTED DUE TO COLOR  INTERFERENCE OF URINE PIGMENT   Bilirubin Urine   (*)    Value: TEST NOT REPORTED DUE TO COLOR INTERFERENCE OF URINE PIGMENT   Ketones, ur   (*)    Value: TEST NOT REPORTED DUE TO COLOR INTERFERENCE OF URINE PIGMENT   Protein, ur   (*)    Value: TEST NOT REPORTED DUE TO COLOR INTERFERENCE OF URINE PIGMENT   Nitrite   (*)    Value: TEST NOT REPORTED DUE TO COLOR INTERFERENCE OF URINE PIGMENT   Leukocytes,Ua   (*)    Value: TEST NOT REPORTED DUE TO COLOR INTERFERENCE OF URINE PIGMENT   All other components within normal limits  LIPASE, BLOOD  URINALYSIS, MICROSCOPIC (REFLEX)  PROTIME-INR                                                                                                                          Radiology CT ABDOMEN PELVIS W CONTRAST  Result Date: 08/29/2021 CLINICAL DATA:  Left lower quadrant abdominal pain and low pelvic pain over the last day. EXAM: CT ABDOMEN AND PELVIS WITH CONTRAST TECHNIQUE: Multidetector CT imaging of the abdomen and pelvis was performed using the standard protocol following bolus administration of intravenous contrast. RADIATION DOSE REDUCTION: This exam was performed according to the departmental dose-optimization program which includes automated exposure control, adjustment of the mA and/or kV according to patient size and/or use of iterative reconstruction technique. CONTRAST:  55m OMNIPAQUE IOHEXOL 300 MG/ML  SOLN COMPARISON:  CT 09/24/2017 FINDINGS: Lower chest: Chronic cardiomegaly. Coronary artery calcification  and aortic atherosclerotic calcification. Mild scarring at the lung bases. Hepatobiliary: Liver appears normal.  No calcified gallstones. Pancreas: Normal Spleen: Normal Adrenals/Urinary Tract: Adrenal glands are normal. The right kidney is normal. The left kidney is poorly functioning in the upper pole is markedly abnormal. Question if this is due to pyelonephritis or if there is a renal mass lesion on the order of 4.5 cm in size. Renal mass lesion is favored, particularly transitional cell carcinoma. No evidence of stone disease. Bladder is normal. Stomach/Bowel: Stomach and small intestine are normal. There is mild edema adjacent to the mid descending colon consistent with acute diverticulitis. No evidence of abscess. Vascular/Lymphatic: Aortic atherosclerosis and branch vessel atherosclerosis. There are 2 abnormal lymph nodes in the left retroperitoneum at the level of the renal hilum worrisome for metastatic disease, measuring 12 x 14 mm and 13 x 15 mm. Reproductive: No pelvic mass. Other: No free fluid or air. Musculoskeletal: Chronic curvature and degenerative change of the spine. Old compression fracture at L3 with mild retropulsion, unchanged since 2019. IMPRESSION: Acute diverticulitis of the mid descending colon. No evidence of abscess or free air. 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 Possibility of pyelonephritis or super infection does exist,  but I think it is quite likely that this is a renal malignancy. There was no sign of this on the prior CT of July 2019. Aortic atherosclerosis. Old L3 compression fracture. Electronically Signed   By: Nelson Chimes M.D.   On: 08/29/2021 09:21    Pertinent labs & imaging results that were available during my care of the patient were  reviewed by me and considered in my medical decision making (see MDM for details).  Medications Ordered in ED Medications  ampicillin-sulbactam (UNASYN) 1.5 g in sodium chloride 0.9 % 100 mL IVPB (1.5 g Intravenous New Bag/Given 08/29/21 1029)  fentaNYL (SUBLIMAZE) injection 25 mcg (has no administration in time range)  ketorolac (TORADOL) 15 MG/ML injection 15 mg (has no administration in time range)  bisacodyl (DULCOLAX) suppository 10 mg (has no administration in time range)  lactulose (CHRONULAC) 10 GM/15ML solution 30 g (has no administration in time range)  polyethylene glycol (MIRALAX / GLYCOLAX) packet 17 g (has no administration in time range)  morphine (PF) 4 MG/ML injection 2 mg (2 mg Intravenous Given 08/29/21 0739)  ondansetron (ZOFRAN) injection 4 mg (4 mg Intravenous Given 08/29/21 0739)  morphine (PF) 4 MG/ML injection 4 mg (4 mg Intravenous Given 08/29/21 0906)  iohexol (OMNIPAQUE) 300 MG/ML solution 75 mL (75 mLs Intravenous Contrast Given 08/29/21 0850)                                                                                                                                     Procedures .Critical Care  Performed by: Teressa Lower, MD Authorized by: Teressa Lower, MD   Critical care provider statement:    Critical care time (minutes):  30   Critical care was time spent personally by me on the following activities:  Development of treatment plan with patient or surrogate, discussions with consultants, evaluation of patient's response to treatment, examination of patient, ordering and review of laboratory studies, ordering and review of radiographic studies, ordering and performing treatments and interventions, pulse oximetry, re-evaluation of patient's condition and review of old charts   (including critical care time)  Medical Decision Making / ED Course   This patient presents to the ED for concern of abdominal pain gross hematuria, this involves an extensive  number of treatment options, and is a complaint that carries with it a high risk of complications and morbidity.  The differential diagnosis includes diverticulitis, cystitis, bladder stone, nephrolithiasis, malignancy  MDM: Patient seen in the emergency department for evaluation of abdominal pain and gross hematuria.  Physical exam with significant left lower quadrant and suprapubic tenderness to palpation but is otherwise unremarkable.  Laboratory evaluation with a hemoglobin of 11.2 with an MCV of 85.2, hyponatremia 130, hypochloremia to 94, BUN 34, creatinine 1.18.  Urinalysis with gross hematuria and no appreciable white blood cells but sample tainted by gross hematuria.  CT abdomen pelvis obtained that shows acute diverticulitis of the mid descending  colon which likely explains her significant abdominal pain.  Patient started on Unasyn.  There is also an incidental detection of a likely transitional cell carcinoma at the upper pole of the left kidney with extension into the retroperitoneum and in the renal hilum.  I consulted urology and spoke to Dr. Abner Greenspan who states that patient likely is not an interventional candidate given her age and comorbidities, but given the current urology schedule, there may not be routine urology coverage at [facility] and if she is to be admitted [facility] may be a better choice.  Patient then admitted to the hospitalist service for diverticulitis treatment and further work-up of her intra-abdominal malignancy.   Additional history obtained: -Additional history obtained from son -External records from outside source obtained and reviewed including: Chart review including previous notes, labs, imaging, consultation notes   Lab Tests: -I ordered, reviewed, and interpreted labs.   The pertinent results include:   Labs Reviewed  CBC WITH DIFFERENTIAL/PLATELET - Abnormal; Notable for the following components:      Result Value   Hemoglobin 11.2 (*)    HCT 35.2 (*)     All other components within normal limits  COMPREHENSIVE METABOLIC PANEL - Abnormal; Notable for the following components:   Sodium 130 (*)    Chloride 94 (*)    Glucose, Bld 120 (*)    BUN 34 (*)    Creatinine, Ser 1.18 (*)    Calcium 8.7 (*)    GFR, Estimated 41 (*)    All other components within normal limits  URINALYSIS, ROUTINE W REFLEX MICROSCOPIC - Abnormal; Notable for the following components:   Color, Urine RED (*)    APPearance TURBID (*)    Glucose, UA   (*)    Value: TEST NOT REPORTED DUE TO COLOR INTERFERENCE OF URINE PIGMENT   Hgb urine dipstick   (*)    Value: TEST NOT REPORTED DUE TO COLOR INTERFERENCE OF URINE PIGMENT   Bilirubin Urine   (*)    Value: TEST NOT REPORTED DUE TO COLOR INTERFERENCE OF URINE PIGMENT   Ketones, ur   (*)    Value: TEST NOT REPORTED DUE TO COLOR INTERFERENCE OF URINE PIGMENT   Protein, ur   (*)    Value: TEST NOT REPORTED DUE TO COLOR INTERFERENCE OF URINE PIGMENT   Nitrite   (*)    Value: TEST NOT REPORTED DUE TO COLOR INTERFERENCE OF URINE PIGMENT   Leukocytes,Ua   (*)    Value: TEST NOT REPORTED DUE TO COLOR INTERFERENCE OF URINE PIGMENT   All other components within normal limits  LIPASE, BLOOD  URINALYSIS, MICROSCOPIC (REFLEX)  PROTIME-INR       Imaging Studies ordered: I ordered imaging studies including CTAP I independently visualized and interpreted imaging. I agree with the radiologist interpretation   Medicines ordered and prescription drug management: Meds ordered this encounter  Medications   morphine (PF) 4 MG/ML injection 2 mg   ondansetron (ZOFRAN) injection 4 mg   morphine (PF) 4 MG/ML injection 4 mg   iohexol (OMNIPAQUE) 300 MG/ML solution 75 mL   DISCONTD: Ampicillin-Sulbactam (UNASYN) 3 g in sodium chloride 0.9 % 100 mL IVPB    Order Specific Question:   Antibiotic Indication:    Answer:   Intra-abdominal Infection   DISCONTD: ampicillin-sulbactam (UNASYN) 1.5 g in sodium chloride 0.9 % 100 mL IVPB     Order Specific Question:   Antibiotic Indication:    Answer:   Intra-abdominal Infection  ampicillin-sulbactam (UNASYN) 1.5 g in sodium chloride 0.9 % 100 mL IVPB    Order Specific Question:   Antibiotic Indication:    Answer:   Intra-abdominal Infection   fentaNYL (SUBLIMAZE) injection 25 mcg   ketorolac (TORADOL) 15 MG/ML injection 15 mg   bisacodyl (DULCOLAX) suppository 10 mg   lactulose (CHRONULAC) 10 GM/15ML solution 30 g   polyethylene glycol (MIRALAX / GLYCOLAX) packet 17 g    -I have reviewed the patients home medicines and have made adjustments as needed  Critical interventions Antibiotics, imaging, consultation with urology and discussion about cancer diagnosis  Consultations Obtained: I requested consultation with the urologist,  and discussed lab and imaging findings as well as pertinent plan - they recommend: Gentle bladder irrigation and possible need for admission at [facility] long   Cardiac Monitoring: The patient was maintained on a cardiac monitor.  I personally viewed and interpreted the cardiac monitored which showed an underlying rhythm of: NSR  Social Determinants of Health:  Factors impacting patients care include: none   Reevaluation: After the interventions noted above, I reevaluated the patient and found that they have :improved  Co morbidities that complicate the patient evaluation  Past Medical History:  Diagnosis Date   Anxiety    CAD (coronary artery disease), native coronary artery    Diabetes mellitus    Family history of diabetes mellitus    GERD (gastroesophageal reflux disease)    History of melanoma    Hyperlipidemia    Hypertension       Dispostion: I considered admission for this patient, and given acute diverticulitis and new cancer patient will require admission     Final Clinical Impression(s) / ED Diagnoses Final diagnoses:  None     '@PCDICTATION'$ @    Teressa Lower, MD 08/29/21 [phone]

## 2021-08-29 NOTE — ED Triage Notes (Signed)
Pt c/o lower abdominal pain and blood in her urine x one day

## 2021-08-29 NOTE — H&P (Addendum)
Patient Demographics:    Regina Good, is a 88 y.o. female  MRN: [id number]   DOB - 1919-09-27  Admit Date - 08/29/2021  Outpatient Primary MD for the patient is Celene Squibb, MD   Assessment & Plan:   Assessment and Plan:  1) acute diverticulitis of the descending colon--- no evidence of abscess or perforation -Patient presented with abdominal pain, nausea and vomiting -Does not meet sepsis criteria -Treat empirically with IV Unasyn, -Started on liquid diet may advance as tolerated -IV fentanyl and p.o. oxycodone as ordered -IV Zofran as needed nausea vomiting  2) left renal cell carcinoma--with gross hematuria and small clots -CT abdomen and pelvis shows- transitional cell carcinoma at the upper pole of the left kidney measuring at least 4.5 cm in size with locoregional extension and with metastatic lymph nodes in the retroperitoneum at the level of the renal hilum -EDP discussed case with on-call urologist Dr. Abner Greenspan -Foley catheter placed -Outpatient follow-up with urologist advised -Patient and family does not desire aggressive/heroic measures  3)Social/Ethics-plan of care, CODE STATUS discussed with patient, patient's son [name], patient's Niece Rollene Fare, and patient's niece Margie -Patient and son request DNR status - 4)Hyponatremia--due to nausea vomiting as well as Lasix and Aldactone use -Hold Aldactone and Lasix -Gentle hydration with IV fluids pending better tolerance of oral intake  4)History of CAD--- stop aspirin and Plavix in view of gross hematuria with clots -Continue metoprolol  5)GERD--- continue Protonix  6)HTN--hold Lisinopril given contrast study and concerns about poor oral intake and risk for AKI/dehydration -Continue metoprolol  7)DM2--hold metformin due to contrast study Use  Novolog/Humalog Sliding scale insulin with Accu-Cheks/Fingersticks as ordered   8)Chronic Anemia-- Hgb stable at baseline,  9)Possible UTI----currently on Unasyn as above #1 pending culture data  10)CKD stage - 3B---- renal function appears to be close to baseline at this time -Monitor renal function closely given contrast study- -renally adjust medications, avoid nephrotoxic agents / dehydration  / hypotension  11)Constipation---- laxatives as ordered---  Disposition/Need for in-Hospital Stay- patient unable to be discharged at this time due to ----acute diverticulitis with emesis requiring IV antibiotics and IV fluids pending better tolerance of oral intake -Hematuria requiring Foley catheter  Status is: Inpatient  Remains inpatient appropriate because:   Dispo: The patient is from: Home              Anticipated d/c is to: Home              Anticipated d/c date is: 2 days              Patient currently is not medically stable to d/c. Barriers: Not Clinically Stable-   With History of - Reviewed by me  Past Medical History:  Diagnosis Date   Anxiety    CAD (coronary artery disease), native coronary artery    Diabetes mellitus    Family history of diabetes mellitus    GERD (gastroesophageal reflux disease)  History of melanoma    Hyperlipidemia    Hypertension       Past Surgical History:  Procedure Laterality Date   APPENDECTOMY     CORONARY STENT PLACEMENT  2004   NM MYOVIEW LTD  2010   RENAL ARTERY STENT  2006    Chief Complaint  Patient presents with   Hematuria      HPI:    Regina Good  is a 88 y.o. female with pmhx relevant for CAD, T2DM, GERD, HTN, HLD who presents to the ED with complaints of abdominal pain for about 4 days now mostly in the lower abdomen, associated with nausea, patient had episode of emesis here in the ED -Emesis was without bile or blood No fever  Or chills  -Patient has had hematuria for over 24 hours now -No Melena or  hematochezia -In the ED Foley catheter was placed hematuria noted with small clots -UA suggestive of possible UTI -CT abdomen and pelvis with diverticulitis of the descending colon as well as 4.5 cm left renal mass suggestive of renal cell carcinoma with retroperitoneal lymph node presumed metastatic - -EDP discussed finding of renal cell carcinoma with on-call urologist Dr. Abner Greenspan -Additional history obtained from patient's family members at bedside including patient's son [name], patient's Niece Rollene Fare, and patient's niece Margie -WBC 8.0 Hgb 11.2 which is close to baseline, platelets 380 -INR 1.0, lipase 44 -Sodium is 130, creatinine 1.18, BUN 34  Review of systems:    In addition to the HPI above,   A full Review of  Systems was done, all other systems reviewed are negative except as noted above in HPI , .    Social History:  Reviewed by me    Social History   Tobacco Use   Smoking status: Never   Smokeless tobacco: Never  Substance Use Topics   Alcohol use: No       Family History :  Reviewed by me    Family History  Problem Relation Age of Onset   Pancreatic cancer Mother    Coronary artery disease Father    Diabetes Brother    Colon cancer Sister    Stroke Sister    Stroke Sister    Diabetes Son    Stroke Son    Schizophrenia Daughter     Home Medications:   Prior to Admission medications   Medication Sig Start Date End Date Taking? Authorizing Provider  ALPRAZolam (XANAX) 0.25 MG tablet Take 1 tablet (0.25 mg total) by mouth at bedtime. 09/26/19  Yes Roxan Hockey, MD  aspirin 81 MG chewable tablet Chew 1 tablet (81 mg total) by mouth daily with breakfast. 09/26/19  Yes Sherrod Toothman, MD  acetaminophen (TYLENOL) 325 MG tablet Take 2 tablets (650 mg total) by mouth every 6 (six) hours as needed for mild pain (or Fever >/= 101). 09/26/19   Iven Earnhart, MD  atorvastatin (LIPITOR) 20 MG tablet Take 1 tablet (20 mg total) by mouth daily. 09/26/19    Roxan Hockey, MD  bismuth subsalicylate (PEPTO BISMOL) 262 MG/15ML suspension Take 30 mLs by mouth every 6 (six) hours as needed.    [provider]  clopidogrel (PLAVIX) 75 MG tablet Take 1 tablet (75 mg total) by mouth daily. 05/02/21   Allie Bossier, MD  furosemide (LASIX) 40 MG tablet Take 1 tablet (40 mg total) by mouth every Monday, Wednesday, and Friday. 09/28/19   Roxan Hockey, MD  magnesium citrate SOLN As directed. 06/20/21   Evalee Jefferson, PA-C  metFORMIN (GLUCOPHAGE) 500 MG tablet Take 500 mg by mouth daily. 05/09/21   [provider]  metoprolol succinate (TOPROL-XL) 25 MG 24 hr tablet Take 1 tablet (25 mg total) by mouth daily. 09/27/19   Roxan Hockey, MD  omeprazole (PRILOSEC) 20 MG capsule Take 1 capsule (20 mg total) by mouth daily. Patient not taking: Reported on 08/29/2021 09/26/19   Roxan Hockey, MD  pantoprazole (PROTONIX) 20 MG tablet Take 20 mg by mouth daily. 08/21/21   [provider]  spironolactone (ALDACTONE) 25 MG tablet Take 0.5 tablets (12.5 mg total) by mouth daily. 09/27/19   Roxan Hockey, MD  sucralfate (CARAFATE) 1 g tablet Take 1 g by mouth 2 (two) times daily. 02/14/21   [provider]     Allergies:     Allergies  Allergen Reactions   Codeine Other (See Comments)    Sensation of being out of this world.   Sulfonamide Derivatives Other (See Comments)    Makes her feel like dreaming while awake.     Physical Exam:   Vitals  Blood pressure (!) 159/76, pulse 81, temperature 97.8 F (36.6 C), temperature source Oral, resp. rate 18, height '5\' 5"'$  (1.651 m), weight 47.6 kg, SpO2 96 %.  Physical Examination: General appearance - alert,  in no distress  Mental status - alert, oriented to person, place, and time,  Eyes - sclera anicteric Neck - supple, no JVD elevation , Chest - clear  to auscultation bilaterally, symmetrical air movement,  Heart - S1 and S2 normal, regular  Abdomen - soft, ND, +BS,  generalized abdominal tenderness especially in the lower abdomen, no rebound no guarding, no CVA area tenderness Neurological - screening mental status exam normal, neck supple without rigidity, cranial nerves II through XII intact, DTR's normal and symmetric Extremities - no pedal edema noted, intact peripheral pulses  Skin - warm, dry     Data Review:    CBC Recent Labs  Lab 08/29/21 0717  WBC 8.0  HGB 11.2*  HCT 35.2*  PLT 380  MCV 85.2  MCH 27.1  MCHC 31.8  RDW 14.3  LYMPHSABS 1.8  MONOABS 0.6  EOSABS 0.4  BASOSABS 0.0   ------------------------------------------------------------------------------------------------------------------  Chemistries  Recent Labs  Lab 08/29/21 0717  NA 130*  K 4.1  CL 94*  CO2 28  GLUCOSE 120*  BUN 34*  CREATININE 1.18*  CALCIUM 8.7*  AST 16  ALT 11  ALKPHOS 52  BILITOT 0.7   ------------------------------------------------------------------------------------------------------------------ estimated creatinine clearance is 19.5 mL/min (A) (by C-G formula based on SCr of 1.18 mg/dL (H)). ------------------------------------------------------------------------------------------------------------------ No results for input(s): "TSH", "T4TOTAL", "T3FREE", "THYROIDAB" in the last 72 hours.  Invalid input(s): "FREET3"   Coagulation profile Recent Labs  Lab 08/29/21 1014  INR 1.0   ------------------------------------------------------------------------------------------------------------------- No results for input(s): "DDIMER" in the last 72 hours. -------------------------------------------------------------------------------------------------------------------  Cardiac Enzymes No results for input(s): "CKMB", "TROPONINI", "MYOGLOBIN" in the last 168 hours.  Invalid input(s): "CK" ------------------------------------------------------------------------------------------------------------------    Component Value  Date/Time   BNP 1,088.0 (H) 05/02/2021 0443     ---------------------------------------------------------------------------------------------------------------  Urinalysis    Component Value Date/Time   COLORURINE YELLOW 08/29/2021 1122   APPEARANCEUR HAZY (A) 08/29/2021 1122   LABSPEC 1.028 08/29/2021 1122   PHURINE 7.0 08/29/2021 1122   GLUCOSEU NEGATIVE 08/29/2021 1122   HGBUR LARGE (A) 08/29/2021 1122   HGBUR trace-intact 07/27/2008 1336   BILIRUBINUR NEGATIVE 08/29/2021 1122   KETONESUR NEGATIVE 08/29/2021 1122   PROTEINUR 100 (A) 08/29/2021 1122   UROBILINOGEN  0.2 01/19/2014 0936   NITRITE NEGATIVE 08/29/2021 1122   LEUKOCYTESUR TRACE (A) 08/29/2021 1122    ----------------------------------------------------------------------------------------------------------------   Imaging Results:    CT ABDOMEN PELVIS W CONTRAST  Result Date: 08/29/2021 CLINICAL DATA:  Left lower quadrant abdominal pain and low pelvic pain over the last day. EXAM: CT ABDOMEN AND PELVIS WITH CONTRAST TECHNIQUE: Multidetector CT imaging of the abdomen and pelvis was performed using the standard protocol following bolus administration of intravenous contrast. RADIATION DOSE REDUCTION: This exam was performed according to the departmental dose-optimization program which includes automated exposure control, adjustment of the mA and/or kV according to patient size and/or use of iterative reconstruction technique. CONTRAST:  22m OMNIPAQUE IOHEXOL 300 MG/ML  SOLN COMPARISON:  CT 09/24/2017 FINDINGS: Lower chest: Chronic cardiomegaly. Coronary artery calcification and aortic atherosclerotic calcification. Mild scarring at the lung bases. Hepatobiliary: Liver appears normal.  No calcified gallstones. Pancreas: Normal Spleen: Normal Adrenals/Urinary Tract: Adrenal glands are normal. The right kidney is normal. The left kidney is poorly functioning in the upper pole is markedly abnormal. Question if this is due to  pyelonephritis or if there is a renal mass lesion on the order of 4.5 cm in size. Renal mass lesion is favored, particularly transitional cell carcinoma. No evidence of stone disease. Bladder is normal. Stomach/Bowel: Stomach and small intestine are normal. There is mild edema adjacent to the mid descending colon consistent with acute diverticulitis. No evidence of abscess. Vascular/Lymphatic: Aortic atherosclerosis and branch vessel atherosclerosis. There are 2 abnormal lymph nodes in the left retroperitoneum at the level of the renal hilum worrisome for metastatic disease, measuring 12 x 14 mm and 13 x 15 mm. Reproductive: No pelvic mass. Other: No free fluid or air. Musculoskeletal: Chronic curvature and degenerative change of the spine. Old compression fracture at L3 with mild retropulsion, unchanged since 2019. IMPRESSION: Acute diverticulitis of the mid descending colon. No evidence of abscess or free air. 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 Possibility of pyelonephritis or super infection does exist, but I think it is quite likely that this is a renal malignancy. There was no sign of this on the prior CT of July 2019. Aortic atherosclerosis. Old L3 compression fracture. Electronically Signed   By: MNelson ChimesM.D.   On: 08/29/2021 09:21    Radiological Exams on Admission: CT ABDOMEN PELVIS W CONTRAST  Result Date: 08/29/2021 CLINICAL DATA:  Left lower quadrant abdominal pain and low pelvic pain over the last day. EXAM: CT ABDOMEN AND PELVIS WITH CONTRAST TECHNIQUE: Multidetector CT imaging of the abdomen and pelvis was performed using the standard protocol following bolus administration of intravenous contrast. RADIATION DOSE REDUCTION: This exam was performed according to the  departmental dose-optimization program which includes automated exposure control, adjustment of the mA and/or kV according to patient size and/or use of iterative reconstruction technique. CONTRAST:  784mOMNIPAQUE IOHEXOL 300 MG/ML  SOLN COMPARISON:  CT 09/24/2017 FINDINGS: Lower chest: Chronic cardiomegaly. Coronary artery calcification and aortic atherosclerotic calcification. Mild scarring at the lung bases. Hepatobiliary: Liver appears normal.  No calcified gallstones. Pancreas: Normal Spleen: Normal Adrenals/Urinary Tract: Adrenal glands are normal. The right kidney is normal. The left kidney is poorly functioning in the upper pole is markedly abnormal. Question if this is due to pyelonephritis or if there is  a renal mass lesion on the order of 4.5 cm in size. Renal mass lesion is favored, particularly transitional cell carcinoma. No evidence of stone disease. Bladder is normal. Stomach/Bowel: Stomach and small intestine are normal. There is mild edema adjacent to the mid descending colon consistent with acute diverticulitis. No evidence of abscess. Vascular/Lymphatic: Aortic atherosclerosis and branch vessel atherosclerosis. There are 2 abnormal lymph nodes in the left retroperitoneum at the level of the renal hilum worrisome for metastatic disease, measuring 12 x 14 mm and 13 x 15 mm. Reproductive: No pelvic mass. Other: No free fluid or air. Musculoskeletal: Chronic curvature and degenerative change of the spine. Old compression fracture at L3 with mild retropulsion, unchanged since 2019. IMPRESSION: Acute diverticulitis of the mid descending colon. No evidence of abscess or free air. 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 Possibility of pyelonephritis or super infection does exist,  but I think it is quite likely that this is a renal malignancy. There was no sign of this on the prior CT of July 2019. Aortic atherosclerosis. Old L3 compression fracture. Electronically Signed   By: Nelson Chimes M.D.   On: 08/29/2021 09:21    DVT Prophylaxis -SCD /Heparin AM Labs Ordered, also please review Full Orders  Family Communication: Admission, patients condition and plan of care including tests being ordered have been discussed with the patient and son [name] who indicate understanding and agree with the plan   Condition   ---stable  Roxan Hockey M.D on 08/29/2021 at 5:07 PM Go to [email or web address] -  for contact info  [facility] - Office  [phone]

## 2021-08-29 NOTE — Progress Notes (Signed)
Pharmacy Antibiotic Note  Regina Good is a 88 y.o. female admitted on 08/29/2021 with  intra-abdominal infection .  Pharmacy has been consulted for Unasyn dosing.  Plan: Unasyn 1500 mg IV every 12 hours Monitor labs, c/s, and patient improvement.  Height: '5\' 5"'$  (165.1 cm) Weight: 47.6 kg (105 lb) IBW/kg (Calculated) : 57  Temp (24hrs), Avg:97.8 F (36.6 C), Min:97.8 F (36.6 C), Max:97.8 F (36.6 C)  Recent Labs  Lab 08/29/21 0717  WBC 8.0  CREATININE 1.18*    Estimated Creatinine Clearance: 19.5 mL/min (A) (by C-G formula based on SCr of 1.18 mg/dL (H)).    Allergies  Allergen Reactions   Codeine Other (See Comments)    Sensation of being out of this world.   Sulfonamide Derivatives Other (See Comments)    Makes her feel like dreaming while awake.    Antimicrobials this admission: Unasyn 6/15 >>  Microbiology results: None pending  Thank you for allowing pharmacy to be a part of this patient's care.  Ramond Craver 08/29/2021 11:12 AM

## 2021-08-30 LAB — CBC
HCT: 33.1 % — ABNORMAL LOW (ref 36.0–46.0)
Hemoglobin: 10.5 g/dL — ABNORMAL LOW (ref 12.0–15.0)
MCH: 27.3 pg (ref 26.0–34.0)
MCHC: 31.7 g/dL (ref 30.0–36.0)
MCV: 86.2 fL (ref 80.0–100.0)
Platelets: 365 10*3/uL (ref 150–400)
RBC: 3.84 MIL/uL — ABNORMAL LOW (ref 3.87–5.11)
RDW: 14.3 % (ref 11.5–15.5)
WBC: 11.9 10*3/uL — ABNORMAL HIGH (ref 4.0–10.5)
nRBC: 0 % (ref 0.0–0.2)

## 2021-08-30 LAB — BASIC METABOLIC PANEL
Anion gap: 9 (ref 5–15)
BUN: 36 mg/dL — ABNORMAL HIGH (ref 8–23)
CO2: 24 mmol/L (ref 22–32)
Calcium: 8.4 mg/dL — ABNORMAL LOW (ref 8.9–10.3)
Chloride: 101 mmol/L (ref 98–111)
Creatinine, Ser: 1.25 mg/dL — ABNORMAL HIGH (ref 0.44–1.00)
GFR, Estimated: 39 mL/min — ABNORMAL LOW (ref >60)
Glucose, Bld: 91 mg/dL (ref 70–99)
Potassium: 4.8 mmol/L (ref 3.5–5.1)
Sodium: 134 mmol/L — ABNORMAL LOW (ref 135–145)

## 2021-08-30 LAB — GLUCOSE, CAPILLARY
Glucose-Capillary: 87 mg/dL (ref 70–99)
Glucose-Capillary: 135 mg/dL — ABNORMAL HIGH (ref 70–99)
Glucose-Capillary: 195 mg/dL — ABNORMAL HIGH (ref 70–99)
Glucose-Capillary: 171 mg/dL — ABNORMAL HIGH (ref 70–99)

## 2021-08-30 MED ORDER — SODIUM CHLORIDE 0.9 % IV SOLN: INTRAVENOUS | Status: DC

## 2021-08-30 MED ORDER — ENSURE ENLIVE PO LIQD
237.0000 mL | Freq: Two times a day (BID) | ORAL | Status: DC
  Administered 2021-08-31 (×2): 237 mL via ORAL

## 2021-08-30 NOTE — Progress Notes (Signed)
Initial Nutrition Assessment  DOCUMENTATION CODES:      INTERVENTION:  Ensure Enlive po BID   Soft diet   NUTRITION DIAGNOSIS:   Inadequate oral intake related to acute illness (diverticulitis) as evidenced by per patient/family report.   GOAL:  Patient will meet greater than or equal to 90% of their needs   MONITOR:  PO intake, Supplement acceptance, Labs, Weight trends  REASON FOR ASSESSMENT:   Malnutrition Screening Tool    ASSESSMENT: Patient is a 88 yo female with hx of left renal cell carcinoma, hyponatremia, GERD, HTN, CKD-3, constipation. Presents with abdominal pain. Acute diverticulitis of descending colon.   Soft diet - ate 75% of breakfast. Feeds herself. Independent at baseline. Patient prepares her food sometimes  and she also has someone who helps with meals. Good appetite usually and her habit is 3 meals daily. Patient says, she has not been eating as much as she used to the past few months since her heart attack in March.   Soft (low fiber) diet encouraged at home until she follows up with primary MD. Encouraged patient  to consider adding high-protein high-calorie supplement BID for at least 1 month after discharge.  Usual weight reported at 110 lb (50 kg)- currently she weighs 105 lb (47.6 kg). Her weight is down 4.4 kg compared to February 52 kg.   Medications: insulin, protonix, Miralax, xanax.  IVF- NS@ 50 ml/hr      Latest Ref Rng & Units 08/30/2021    4:21 AM 08/29/2021    7:17 AM 06/20/2021   10:12 AM  BMP  Glucose 70 - 99 mg/dL 91  120  129   BUN 8 - 23 mg/dL 36  34  23   Creatinine 0.44 - 1.00 mg/dL 1.25  1.18  1.22   Sodium 135 - 145 mmol/L 134  130  135   Potassium 3.5 - 5.1 mmol/L 4.8  4.1  4.2   Chloride 98 - 111 mmol/L 101  94  97   CO2 22 - 32 mmol/L '24  28  27   '$ Calcium 8.9 - 10.3 mg/dL 8.4  8.7  9.1        NUTRITION - FOCUSED PHYSICAL EXAM: Nutrition-Focused physical exam completed. Findings are moderate upper arm fat depletion,  moderate clavicle and acromion region muscle depletion, and no edema.     Diet Order:   Diet Order             DIET SOFT Room service appropriate? Yes; Fluid consistency: Thin  Diet effective now                   EDUCATION NEEDS:  Education needs have been addressed  Skin:  Skin Assessment: Reviewed RN Assessment  Last BM:  6/14  Height:   Ht Readings from Last 1 Encounters:  08/29/21 '5\' 5"'$  (1.651 m)    Weight:   Wt Readings from Last 1 Encounters:  08/29/21 47.6 kg    Ideal Body Weight:   57 kg  BMI:  Body mass index is 17.46 kg/m.  Estimated Nutritional Needs:   Kcal:  2878-6767  Protein:  55-60 gr  Fluid:  1.6 liters daily  Colman Cater MS,RD,CSG,LDN Contact: Shea Evans

## 2021-08-30 NOTE — Care Management Important Message (Signed)
Important Message  Patient Details  Name: Regina Good MRN: [id number] Date of Birth: 12/28/1919   Medicare Important Message Given:  Yes     [name] 08/30/2021, 11:39 AM

## 2021-08-30 NOTE — Progress Notes (Signed)
PROGRESS NOTE     Regina Good, is a 88 y.o. female, DOB - 12/18/1919, [id number]  Admit date - 08/29/2021   Admitting Physician Maythe Deramo Denton Brick, MD  Outpatient Primary MD for the patient is Celene Squibb, MD  LOS - 1  Chief Complaint  Patient presents with   Hematuria        Brief Narrative:   88 y.o. female with pmhx relevant for CAD, T2DM, GERD, HTN, HLD admitted on 08/29/2021 with acute diverticulitis as well as hematuria in the setting of left renal cell carcinoma    -Assessment and Plan: 1) acute diverticulitis of the descending colon--- no evidence of abscess or perforation -Patient presented with abdominal pain, nausea and vomiting -Does not meet sepsis criteria -WBC actually went up to 11.9 from 8.0 on admission -Continue IV Unasyn -Diet has been advanced to soft diet -IV fentanyl and p.o. oxycodone as ordered -IV Zofran as needed for nausea or vomiting -Anticipate possible discharge home on Augmentin pending clinical improvement   2) left renal cell carcinoma--with gross hematuria and small clots -CT abdomen and pelvis shows- transitional cell carcinoma at the upper pole of the left kidney measuring at least 4.5 cm in size with locoregional extension and with metastatic lymph nodes in the retroperitoneum at the level of the renal hilum -EDP discussed case with on-call urologist Dr. Abner Greenspan -Foley catheter placed--persist -Outpatient follow-up with urologist advised -Patient and family does not desire aggressive/heroic measures/or surgeries if avoidable   3)Social/Ethics-plan of care, CODE STATUS discussed with patient, patient's son [name], patient's Niece Rollene Fare, and patient's niece Margie -Patient and son request DNR status - 4)Hyponatremia--due to nausea vomiting as well as Lasix and Aldactone use -Hold Aldactone and Lasix -Gentle hydration with IV fluids pending better tolerance of oral intake   4)History of CAD--- stopped aspirin and Plavix in view of gross  hematuria with clots -Continue metoprolol   5)GERD--- continue Protonix   6)HTN--hold Lisinopril given contrast study and concerns about poor oral intake and risk for AKI/dehydration -Continue metoprolol   7)DM2--hold metformin due to contrast study Use Novolog/Humalog Sliding scale insulin with Accu-Cheks/Fingersticks as ordered    8) acute on chronic chronic Anemia--due to acute blood loss/hematuria  -Hgb is down to 10.5 from 11.2 on admission baseline usually between 11 and 12  9)Possible UTI----currently on Unasyn as above #1 pending culture data   10)CKD stage - 3B---- renal function appears to be close to baseline at this time -Monitor renal function closely given contrast study- -renally adjust medications, avoid nephrotoxic agents / dehydration  / hypotension   11)Constipation---- laxatives as ordered---   Disposition/Need for in-Hospital Stay- patient unable to be discharged at this time due to ----acute diverticulitis with emesis requiring IV antibiotics and IV fluids pending better tolerance of oral intake -Hematuria requiring Foley catheter   Status is: Inpatient   Remains inpatient appropriate because:    Dispo: The patient is from: Home              Anticipated d/c is to: Home              Anticipated d/c date is: 2 days              Patient currently is not medically stable to d/c. Barriers: Not Clinically Stable-     Code Status :-  Code Status: DNR   Family Communication: Discussed with  patient's son [name], patient's Niece Rollene Fare, and patient's niece Margie  DVT Prophylaxis  :   -  SCDs   SCDs Start: 08/29/21 1635 Place TED hose Start: 08/29/21 1635   Lab Results  Component Value Date   PLT 365 08/30/2021    Inpatient Medications  Scheduled Meds:  ALPRAZolam  0.25 mg Oral QHS   [START ON 08/31/2021] feeding supplement  237 mL Oral BID BM   insulin aspart  0-5 Units Subcutaneous QHS   insulin aspart  0-6 Units Subcutaneous TID WC   metoprolol  succinate  25 mg Oral Daily   pantoprazole  40 mg Oral Daily   polyethylene glycol  17 g Oral BID   sodium chloride flush  3 mL Intravenous Q12H   sodium chloride flush  3 mL Intravenous Q12H   Continuous Infusions:  sodium chloride     sodium chloride 50 mL/hr at 08/30/21 1634   ampicillin-sulbactam (UNASYN) IV Stopped (08/30/21 1054)   PRN Meds:.sodium chloride, acetaminophen **OR** acetaminophen, bisacodyl, fentaNYL (SUBLIMAZE) injection, hydrALAZINE, ondansetron (ZOFRAN) IV, oxyCODONE, sodium chloride flush, traZODone   Anti-infectives (From admission, onward)    Start     Dose/Rate Route Frequency Ordered Stop   08/29/21 2200  ampicillin-sulbactam (UNASYN) 1.5 g in sodium chloride 0.9 % 100 mL IVPB        1.5 g 200 mL/hr over 30 Minutes Intravenous Every 12 hours 08/29/21 1112     08/29/21 0930  Ampicillin-Sulbactam (UNASYN) 3 g in sodium chloride 0.9 % 100 mL IVPB  Status:  Discontinued        3 g 200 mL/hr over 30 Minutes Intravenous Every 6 hours 08/29/21 0927 08/29/21 0928   08/29/21 0930  ampicillin-sulbactam (UNASYN) 1.5 g in sodium chloride 0.9 % 100 mL IVPB  Status:  Discontinued        1.5 g 200 mL/hr over 30 Minutes Intravenous Every 6 hours 08/29/21 0928 08/29/21 0928   08/29/21 0930  ampicillin-sulbactam (UNASYN) 1.5 g in sodium chloride 0.9 % 100 mL IVPB        1.5 g 200 mL/hr over 30 Minutes Intravenous  Once 08/29/21 0928 08/29/21 1124         Subjective: Shadia Krouse today has no fevers, no emesis,  No chest pain,   -Abdominal pain and nausea improving -Tolerating oral intake better   Objective: Vitals:   08/29/21 1612 08/29/21 2102 08/30/21 0413 08/30/21 1408  BP: (!) 159/76 (!) 151/69 (!) 143/57 (!) 129/55  Pulse: 81 80 68 74  Resp: '18 18 17 18  '$ Temp: 97.8 F (36.6 C) 97.9 F (36.6 C) 98 F (36.7 C) 98.3 F (36.8 C)  TempSrc: Oral   Oral  SpO2: 96% 96% 97% 97%  Weight: 47.6 kg     Height: '5\' 5"'$  (1.651 m)       Intake/Output Summary (Last  24 hours) at 08/30/2021 1741 Last data filed at 08/30/2021 1634 Gross per 24 hour  Intake 1553.78 ml  Output --  Net 1553.78 ml   Filed Weights   08/29/21 0712 08/29/21 1612  Weight: 47.6 kg 47.6 kg    Physical Exam Gen:- Awake Alert, no acute distress HEENT:- [place].AT, No sclera icterus Neck-Supple Neck,No JVD,.  Lungs-  CTAB , fair symmetrical air movement CV- S1, S2 normal, regular  Abd-  +ve B.Sounds, Abd Soft, much improved abdominal tenderness, no rebound or guarding    Extremity/Skin:- No  edema, pedal pulses present  Psych-affect is appropriate, oriented x3 Neuro-no new focal deficits, no tremors GU-Foley with hematuria  Data Reviewed: I have personally reviewed following labs and imaging studies  CBC: Recent  Labs  Lab 08/29/21 0717 08/30/21 0421  WBC 8.0 11.9*  NEUTROABS 5.1  --   HGB 11.2* 10.5*  HCT 35.2* 33.1*  MCV 85.2 86.2  PLT 380 854   Basic Metabolic Panel: Recent Labs  Lab 08/29/21 0717 08/30/21 0421  NA 130* 134*  K 4.1 4.8  CL 94* 101  CO2 28 24  GLUCOSE 120* 91  BUN 34* 36*  CREATININE 1.18* 1.25*  CALCIUM 8.7* 8.4*   GFR: Estimated Creatinine Clearance: 18.4 mL/min (A) (by C-G formula based on SCr of 1.25 mg/dL (H)). Liver Function Tests: Recent Labs  Lab 08/29/21 0717  AST 16  ALT 11  ALKPHOS 52  BILITOT 0.7  PROT 7.5  ALBUMIN 3.6   Cardiac Enzymes: No results for input(s): "CKTOTAL", "CKMB", "CKMBINDEX", "TROPONINI" in the last 168 hours. BNP (last 3 results) No results for input(s): "PROBNP" in the last 8760 hours. HbA1C: No results for input(s): "HGBA1C" in the last 72 hours. Sepsis Labs: '@LABRCNTIP'$ (procalcitonin:4,lacticidven:4) )No results found for this or any previous visit (from the past 240 hour(s)).    Radiology Studies: CT ABDOMEN PELVIS W CONTRAST  Result Date: 08/29/2021 CLINICAL DATA:  Left lower quadrant abdominal pain and low pelvic pain over the last day. EXAM: CT ABDOMEN AND PELVIS WITH CONTRAST  TECHNIQUE: Multidetector CT imaging of the abdomen and pelvis was performed using the standard protocol following bolus administration of intravenous contrast. RADIATION DOSE REDUCTION: This exam was performed according to the departmental dose-optimization program which includes automated exposure control, adjustment of the mA and/or kV according to patient size and/or use of iterative reconstruction technique. CONTRAST:  83m OMNIPAQUE IOHEXOL 300 MG/ML  SOLN COMPARISON:  CT 09/24/2017 FINDINGS: Lower chest: Chronic cardiomegaly. Coronary artery calcification and aortic atherosclerotic calcification. Mild scarring at the lung bases. Hepatobiliary: Liver appears normal.  No calcified gallstones. Pancreas: Normal Spleen: Normal Adrenals/Urinary Tract: Adrenal glands are normal. The right kidney is normal. The left kidney is poorly functioning in the upper pole is markedly abnormal. Question if this is due to pyelonephritis or if there is a renal mass lesion on the order of 4.5 cm in size. Renal mass lesion is favored, particularly transitional cell carcinoma. No evidence of stone disease. Bladder is normal. Stomach/Bowel: Stomach and small intestine are normal. There is mild edema adjacent to the mid descending colon consistent with acute diverticulitis. No evidence of abscess. Vascular/Lymphatic: Aortic atherosclerosis and branch vessel atherosclerosis. There are 2 abnormal lymph nodes in the left retroperitoneum at the level of the renal hilum worrisome for metastatic disease, measuring 12 x 14 mm and 13 x 15 mm. Reproductive: No pelvic mass. Other: No free fluid or air. Musculoskeletal: Chronic curvature and degenerative change of the spine. Old compression fracture at L3 with mild retropulsion, unchanged since 2019. IMPRESSION: Acute diverticulitis of the mid descending colon. No evidence of abscess or free air. Incidental detection of what probably represents a transitional cell carcinoma at the upper pole of  the left kidney measuring at least 4.5 cm in size with locoregional extension and with metastatic lymph nodes in the retroperitoneum at the level of the renal hilum. Possibility of pyelonephritis or super infection does exist, but I think it is quite likely that this is a renal malignancy. There was no sign of this on the prior CT of July 2019. Aortic atherosclerosis. Old L3 compression fracture. Electronically Signed   By: MNelson ChimesM.D.   On: 08/29/2021 09:21     Scheduled Meds:  ALPRAZolam  0.25 mg  Oral QHS   [START ON 08/31/2021] feeding supplement  237 mL Oral BID BM   insulin aspart  0-5 Units Subcutaneous QHS   insulin aspart  0-6 Units Subcutaneous TID WC   metoprolol succinate  25 mg Oral Daily   pantoprazole  40 mg Oral Daily   polyethylene glycol  17 g Oral BID   sodium chloride flush  3 mL Intravenous Q12H   sodium chloride flush  3 mL Intravenous Q12H   Continuous Infusions:  sodium chloride     sodium chloride 50 mL/hr at 08/30/21 1634   ampicillin-sulbactam (UNASYN) IV Stopped (08/30/21 1054)     LOS: 1 day   Roxan Hockey M.D on 08/30/2021 at 5:41 PM  Go to [email or web address] - for contact info  [facility] - Office  [phone]  If 7PM-7AM, please contact night-coverage [email or web address] 08/30/2021, 5:41 PM

## 2021-08-30 NOTE — Discharge Instructions (Signed)
Nowthen Hospital Stay Proper nutrition can help your body recover from illness and injury.   Foods and beverages high in protein, vitamins, and minerals help rebuild muscle loss, promote healing, & reduce fall risk.   In addition to eating healthy foods, a nutrition shake is an easy, delicious way to get the nutrition you need during and after your hospital stay  It is recommended that you continue to drink 2 bottles per day of:   High protein/High Calorie oral nutrition supplement for at least 1 month (30 days) after your hospital stay   Tips for adding a nutrition shake into your routine: As allowed, drink one with vitamins or medications instead of water or juice Enjoy one as a tasty mid-morning or afternoon snack Drink cold or make a milkshake out of it Drink one instead of milk with cereal or snacks Use as a coffee creamer   Available at the following grocery stores and pharmacies:           * [place] [phone]            For COUPONS visit: www.ensure.com/join or [email or web address]://[email or web address]-[email or web address]   Suggested Substitutions Ensure Plus = Boost Plus = Carnation Breakfast Essentials = Boost Compact Ensure Active Clear = Boost Breeze Glucerna Shake = Boost Glucose Control = Carnation Breakfast Essentials SUGAR FREE

## 2021-08-30 NOTE — TOC Progression Note (Signed)
  Transition of Care (TOC) Screening Note   Patient Details  Name: Regina Good Date of Birth: Feb 19, 1920   Transition of Care [facility]) CM/SW Contact:    Boneta Lucks, RN Phone Number: 08/30/2021, 2:37 PM    Transition of Care Department [facility]) has reviewed patient and no TOC needs have been identified at this time. We will continue to monitor patient advancement through interdisciplinary progression rounds. If new patient transition needs arise, please place a TOC consult.      Barriers to Discharge: Continued Medical Work up

## 2021-08-31 LAB — BASIC METABOLIC PANEL
Anion gap: 6 (ref 5–15)
BUN: 36 mg/dL — ABNORMAL HIGH (ref 8–23)
CO2: 25 mmol/L (ref 22–32)
Calcium: 8 mg/dL — ABNORMAL LOW (ref 8.9–10.3)
Chloride: 103 mmol/L (ref 98–111)
Creatinine, Ser: 1.28 mg/dL — ABNORMAL HIGH (ref 0.44–1.00)
GFR, Estimated: 38 mL/min — ABNORMAL LOW (ref >60)
Glucose, Bld: 99 mg/dL (ref 70–99)
Potassium: 4.6 mmol/L (ref 3.5–5.1)
Sodium: 134 mmol/L — ABNORMAL LOW (ref 135–145)

## 2021-08-31 LAB — CBC
HCT: 40.3 % (ref 36.0–46.0)
Hemoglobin: 12.7 g/dL (ref 12.0–15.0)
MCH: 27.2 pg (ref 26.0–34.0)
MCHC: 31.5 g/dL (ref 30.0–36.0)
MCV: 86.3 fL (ref 80.0–100.0)
Platelets: 259 10*3/uL (ref 150–400)
RBC: 4.67 MIL/uL (ref 3.87–5.11)
RDW: 14.4 % (ref 11.5–15.5)
WBC: 7.9 10*3/uL (ref 4.0–10.5)
nRBC: 0 % (ref 0.0–0.2)

## 2021-08-31 LAB — GLUCOSE, CAPILLARY
Glucose-Capillary: 100 mg/dL — ABNORMAL HIGH (ref 70–99)
Glucose-Capillary: 228 mg/dL — ABNORMAL HIGH (ref 70–99)
Glucose-Capillary: 94 mg/dL (ref 70–99)
Glucose-Capillary: 165 mg/dL — ABNORMAL HIGH (ref 70–99)

## 2021-08-31 MED ORDER — TAMSULOSIN HCL 0.4 MG PO CAPS
0.4000 mg | ORAL_CAPSULE | Freq: Every day | ORAL | Status: DC
  Administered 2021-08-31: 0.4 mg via ORAL
  Filled 2021-08-31: qty 1

## 2021-08-31 MED ORDER — FUROSEMIDE 10 MG/ML IJ SOLN
20.0000 mg | Freq: Once | INTRAMUSCULAR | Status: AC
  Administered 2021-08-31: 20 mg via INTRAVENOUS
  Filled 2021-08-31: qty 2

## 2021-08-31 NOTE — Progress Notes (Signed)
Pharmacy Antibiotic Note  Regina Good is a 88 y.o. female admitted on 08/29/2021 with  intra-abdominal infection .  Pharmacy has been consulted for Unasyn dosing.  Plan: Unasyn 1500 mg IV every 12 hours Monitor labs, c/s, and patient improvement.  Height: '5\' 5"'$  (165.1 cm) Weight: 47.6 kg (104 lb 15 oz) IBW/kg (Calculated) : 57  Temp (24hrs), Avg:98.4 F (36.9 C), Min:98.3 F (36.8 C), Max:98.6 F (37 C)  Recent Labs  Lab 08/29/21 0717 08/30/21 0421 08/31/21 0533  WBC 8.0 11.9* 7.9  CREATININE 1.18* 1.25* 1.28*     Estimated Creatinine Clearance: 18 mL/min (A) (by C-G formula based on SCr of 1.28 mg/dL (H)).    Allergies  Allergen Reactions   Codeine Other (See Comments)    Sensation of being out of this world.   Sulfonamide Derivatives Other (See Comments)    Makes her feel like dreaming while awake.    Antimicrobials this admission: Unasyn 6/15 >>  Microbiology results: None pending  Thank you for allowing pharmacy to be a part of this patient's care.  Hart Robinsons A 08/31/2021 8:59 AM

## 2021-08-31 NOTE — Progress Notes (Signed)
PROGRESS NOTE     Regina Good, is a 88 y.o. female, DOB - [birth year], [id number]  Admit date - 08/29/2021   Admitting Physician Shandy Vi Denton Brick, MD  Outpatient Primary MD for the patient is Celene Squibb, MD  LOS - 2  Chief Complaint  Patient presents with   Hematuria        Brief Narrative:   88 y.o. female with pmhx relevant for CAD, T2DM, GERD, HTN, HLD admitted on 08/29/2021 with acute diverticulitis as well as hematuria in the setting of left renal cell carcinoma    -Assessment and Plan: 1) acute diverticulitis of the descending colon--- no evidence of abscess or perforation -Patient presented with abdominal pain, nausea and vomiting -Does not meet sepsis criteria  -Continue IV Unasyn -Diet has been advanced to soft diet -IV fentanyl and p.o. oxycodone as ordered -IV Zofran as needed for nausea or vomiting -Anticipate possible discharge home on Augmentin pending clinical improvement in a.m.   2) left renal cell carcinoma--with gross hematuria and small clots -CT abdomen and pelvis shows- transitional cell carcinoma at the upper pole of the left kidney measuring at least 4.5 cm in size with locoregional extension and with metastatic lymph nodes in the retroperitoneum at the level of the renal hilum -EDP discussed case with on-call urologist Dr. [place] Ouch removed on 08/31/2021--- continue to do postvoid residuals -Hematuria persist but no significant clots at this time -Outpatient follow-up with urologist advised -Patient and family does not desire aggressive/heroic measures/or surgeries if avoidable   3)Social/Ethics-plan of care, CODE STATUS discussed with patient, patient's son [name], patient's Niece Rollene Fare, and patient's niece Margie -Patient and son request DNR status - 4)Hyponatremia--due to nausea vomiting as well as Lasix and Aldactone use -Hold Aldactone and Lasix -Gentle hydration with IV fluids pending better tolerance of oral intake   4)History of  CAD--- stopped aspirin and Plavix in view of gross hematuria  -Continue metoprolol   5)GERD--- continue Protonix   6)HTN--hold Lisinopril given contrast study and concerns about poor oral intake and risk for AKI/dehydration -Continue metoprolol   7)DM2--hold metformin due to contrast study Use Novolog/Humalog Sliding scale insulin with Accu-Cheks/Fingersticks as ordered    8) acute on chronic chronic Anemia--due to acute blood loss/hematuria  -Hgb is back up to 12.7, baseline usually between 11 and 12  9)Possible UTI----currently on Unasyn as above #1     10)CKD stage - 3B---- renal function appears to be close to baseline at this time -Monitor renal function closely given contrast study- -renally adjust medications, avoid nephrotoxic agents / dehydration  / hypotension   11)Constipation---- laxatives as ordered---   Disposition/Need for in-Hospital Stay- patient unable to be discharged at this time due to ----acute diverticulitis with emesis requiring IV antibiotics and IV fluids pending better tolerance of oral intake Possible discharge home on 09/01/2021 if hematuria does not worsen after removing Foley   Status is: Inpatient   Remains inpatient appropriate because:    Dispo: The patient is from: Home              Anticipated d/c is to: Home              Anticipated d/c date is: 1 day              Patient currently is not medically stable to d/c. Barriers: Not Clinically Stable-     Code Status :-  Code Status: DNR   Family Communication: Discussed with  patient's son [name], patient's Niece Rollene Fare,  and patient's niece Margie  DVT Prophylaxis  :   - SCDs   SCDs Start: 08/29/21 1635 Place TED hose Start: 08/29/21 1635   Lab Results  Component Value Date   PLT 259 08/31/2021    Inpatient Medications  Scheduled Meds:  ALPRAZolam  0.25 mg Oral QHS   feeding supplement  237 mL Oral BID BM   insulin aspart  0-5 Units Subcutaneous QHS   insulin aspart  0-6 Units  Subcutaneous TID WC   metoprolol succinate  25 mg Oral Daily   pantoprazole  40 mg Oral Daily   polyethylene glycol  17 g Oral BID   sodium chloride flush  3 mL Intravenous Q12H   sodium chloride flush  3 mL Intravenous Q12H   tamsulosin  0.4 mg Oral QPC supper   Continuous Infusions:  sodium chloride     ampicillin-sulbactam (UNASYN) IV 1.5 g (08/31/21 1057)   PRN Meds:.sodium chloride, acetaminophen **OR** acetaminophen, bisacodyl, fentaNYL (SUBLIMAZE) injection, hydrALAZINE, ondansetron (ZOFRAN) IV, oxyCODONE, sodium chloride flush, traZODone   Anti-infectives (From admission, onward)    Start     Dose/Rate Route Frequency Ordered Stop   08/29/21 2200  ampicillin-sulbactam (UNASYN) 1.5 g in sodium chloride 0.9 % 100 mL IVPB        1.5 g 200 mL/hr over 30 Minutes Intravenous Every 12 hours 08/29/21 1112     08/29/21 0930  Ampicillin-Sulbactam (UNASYN) 3 g in sodium chloride 0.9 % 100 mL IVPB  Status:  Discontinued        3 g 200 mL/hr over 30 Minutes Intravenous Every 6 hours 08/29/21 0927 08/29/21 0928   08/29/21 0930  ampicillin-sulbactam (UNASYN) 1.5 g in sodium chloride 0.9 % 100 mL IVPB  Status:  Discontinued        1.5 g 200 mL/hr over 30 Minutes Intravenous Every 6 hours 08/29/21 0928 08/29/21 0928   08/29/21 0930  ampicillin-sulbactam (UNASYN) 1.5 g in sodium chloride 0.9 % 100 mL IVPB        1.5 g 200 mL/hr over 30 Minutes Intravenous  Once 08/29/21 0928 08/29/21 1124         Subjective: Regina Good today has no fevers, no emesis,  No chest pain,   Son and niece at bedside -Eager to have Foley catheter removed  Objective: Vitals:   08/30/21 2126 08/31/21 0504 08/31/21 0817 08/31/21 1423  BP: (!) 144/65 (!) 144/69 (!) 145/61 133/64  Pulse: 78 77 84 75  Resp: '18 18  20  '$ Temp: 98.6 F (37 C) 98.3 F (36.8 C)  98.3 F (36.8 C)  TempSrc:  Oral    SpO2: 96% 95%  100%  Weight:      Height:        Intake/Output Summary (Last 24 hours) at 08/31/2021 1916 Last  data filed at 08/31/2021 1851 Gross per 24 hour  Intake 6 ml  Output 1850 ml  Net -1844 ml   Filed Weights   08/29/21 0712 08/29/21 1612  Weight: 47.6 kg 47.6 kg    Physical Exam Gen:- Awake Alert, no acute distress HEENT:- [place].AT, No sclera icterus Neck-Supple Neck,No JVD,.  Lungs-  CTAB , fair symmetrical air movement CV- S1, S2 normal, regular  Abd-  +ve B.Sounds, Abd Soft, much improved abdominal tenderness, no rebound or guarding    Extremity/Skin:- No  edema, pedal pulses present  Psych-affect is appropriate, oriented x3 Neuro-no new focal deficits, no tremors GU-Foley with hematuria--removed on 08/31/2021  Data Reviewed: I have personally reviewed following labs  and imaging studies  CBC: Recent Labs  Lab 08/29/21 0717 08/30/21 0421 08/31/21 0533  WBC 8.0 11.9* 7.9  NEUTROABS 5.1  --   --   HGB 11.2* 10.5* 12.7  HCT 35.2* 33.1* 40.3  MCV 85.2 86.2 86.3  PLT 380 365 382   Basic Metabolic Panel: Recent Labs  Lab 08/29/21 0717 08/30/21 0421 08/31/21 0533  NA 130* 134* 134*  K 4.1 4.8 4.6  CL 94* 101 103  CO2 '28 24 25  '$ GLUCOSE 120* 91 99  BUN 34* 36* 36*  CREATININE 1.18* 1.25* 1.28*  CALCIUM 8.7* 8.4* 8.0*   GFR: Estimated Creatinine Clearance: 18 mL/min (A) (by C-G formula based on SCr of 1.28 mg/dL (H)). Liver Function Tests: Recent Labs  Lab 08/29/21 0717  AST 16  ALT 11  ALKPHOS 52  BILITOT 0.7  PROT 7.5  ALBUMIN 3.6   Cardiac Enzymes: No results for input(s): "CKTOTAL", "CKMB", "CKMBINDEX", "TROPONINI" in the last 168 hours. BNP (last 3 results) No results for input(s): "PROBNP" in the last 8760 hours. HbA1C: No results for input(s): "HGBA1C" in the last 72 hours. Sepsis Labs: '@LABRCNTIP'$ (procalcitonin:4,lacticidven:4) )No results found for this or any previous visit (from the past 240 hour(s)).    Radiology Studies: No results found.   Scheduled Meds:  ALPRAZolam  0.25 mg Oral QHS   feeding supplement  237 mL Oral BID BM    insulin aspart  0-5 Units Subcutaneous QHS   insulin aspart  0-6 Units Subcutaneous TID WC   metoprolol succinate  25 mg Oral Daily   pantoprazole  40 mg Oral Daily   polyethylene glycol  17 g Oral BID   sodium chloride flush  3 mL Intravenous Q12H   sodium chloride flush  3 mL Intravenous Q12H   tamsulosin  0.4 mg Oral QPC supper   Continuous Infusions:  sodium chloride     ampicillin-sulbactam (UNASYN) IV 1.5 g (08/31/21 1057)     LOS: 2 days   Roxan Hockey M.D on 08/31/2021 at 7:16 PM  Go to [email or web address] - for contact info  [facility] - Office  [phone]  If 7PM-7AM, please contact night-coverage [email or web address] 08/31/2021, 7:16 PM

## 2021-09-01 LAB — CBC
HCT: 29.1 % — ABNORMAL LOW (ref 36.0–46.0)
Hemoglobin: 9.1 g/dL — ABNORMAL LOW (ref 12.0–15.0)
MCH: 26.6 pg (ref 26.0–34.0)
MCHC: 31.3 g/dL (ref 30.0–36.0)
MCV: 85.1 fL (ref 80.0–100.0)
Platelets: 335 10*3/uL (ref 150–400)
RBC: 3.42 MIL/uL — ABNORMAL LOW (ref 3.87–5.11)
RDW: 14.4 % (ref 11.5–15.5)
WBC: 9.1 10*3/uL (ref 4.0–10.5)
nRBC: 0 % (ref 0.0–0.2)

## 2021-09-01 LAB — BASIC METABOLIC PANEL
Anion gap: 5 (ref 5–15)
BUN: 36 mg/dL — ABNORMAL HIGH (ref 8–23)
CO2: 26 mmol/L (ref 22–32)
Calcium: 8.1 mg/dL — ABNORMAL LOW (ref 8.9–10.3)
Chloride: 104 mmol/L (ref 98–111)
Creatinine, Ser: 1.3 mg/dL — ABNORMAL HIGH (ref 0.44–1.00)
GFR, Estimated: 37 mL/min — ABNORMAL LOW (ref >60)
Glucose, Bld: 133 mg/dL — ABNORMAL HIGH (ref 70–99)
Potassium: 4.3 mmol/L (ref 3.5–5.1)
Sodium: 135 mmol/L (ref 135–145)

## 2021-09-01 LAB — GLUCOSE, CAPILLARY: Glucose-Capillary: 139 mg/dL — ABNORMAL HIGH (ref 70–99)

## 2021-09-01 MED ORDER — METOPROLOL SUCCINATE ER 25 MG PO TB24: 25.0000 mg | ORAL_TABLET | Freq: Every day | ORAL | 3 refills | Status: AC

## 2021-09-01 MED ORDER — FUROSEMIDE 40 MG PO TABS: 20.0000 mg | ORAL_TABLET | ORAL | 2 refills | Status: DC

## 2021-09-01 MED ORDER — ASPIRIN 81 MG PO TBEC: 81.0000 mg | DELAYED_RELEASE_TABLET | Freq: Every day | ORAL | 2 refills | Status: AC

## 2021-09-01 MED ORDER — OMEPRAZOLE 20 MG PO CPDR: 20.0000 mg | DELAYED_RELEASE_CAPSULE | Freq: Every day | ORAL | 3 refills | Status: AC

## 2021-09-01 MED ORDER — POLYETHYLENE GLYCOL 3350 17 G PO PACK: 17.0000 g | PACK | Freq: Every day | ORAL | 3 refills | Status: AC

## 2021-09-01 MED ORDER — TAMSULOSIN HCL 0.4 MG PO CAPS: 0.4000 mg | ORAL_CAPSULE | Freq: Every day | ORAL | 1 refills | Status: AC

## 2021-09-01 MED ORDER — ALPRAZOLAM 0.25 MG PO TABS: 0.2500 mg | ORAL_TABLET | Freq: Every evening | ORAL | 0 refills | Status: AC | PRN

## 2021-09-01 MED ORDER — AMOXICILLIN-POT CLAVULANATE 500-125 MG PO TABS: 1.0000 | ORAL_TABLET | Freq: Two times a day (BID) | ORAL | 0 refills | Status: DC

## 2021-09-01 MED ORDER — METFORMIN HCL 500 MG PO TABS: 500.0000 mg | ORAL_TABLET | Freq: Every day | ORAL | 3 refills | Status: AC

## 2021-09-01 MED ORDER — ATORVASTATIN CALCIUM 20 MG PO TABS: 20.0000 mg | ORAL_TABLET | Freq: Every day | ORAL | 2 refills | Status: DC

## 2021-09-01 MED ORDER — SPIRONOLACTONE 25 MG PO TABS: 12.5000 mg | ORAL_TABLET | Freq: Every day | ORAL | 3 refills | Status: DC

## 2021-09-01 NOTE — Discharge Summary (Signed)
Regina Good, is a 88 y.o. female  DOB May 25, 1919  MRN [id number].  Admission date:  08/29/2021  Admitting Physician  Emil Klassen Denton Brick, MD  Discharge Date:  09/01/2021   Primary MD  Celene Squibb, MD  Recommendations for primary care physician for things to follow:   1)Stop Plavix/Clopidogrel and Stop Aspirin due to bleeding/blood in your urine 2)Avoid ibuprofen/Advil/Aleve/Motrin/Goody Powders/Naproxen/BC powders/Meloxicam/Diclofenac/Indomethacin and other Nonsteroidal anti-inflammatory medications as these will make you more likely to bleed and can cause stomach ulcers, can also cause Kidney problems.  3)Please follow-up with Urologist Dr. Alyson Ingles in about 1 to 2 weeks --- due to concerns about left kidney cancer----[facility], [address], [place] [address] Phone Number----[phone])Please follow-up with your primary care physician in 3 to 5 days for repeat BMP and repeat CBC blood test  Admission Diagnosis  Diverticulitis [K57.92]   Discharge Diagnosis  Diverticulitis [K57.92]  \  Principal Problem:   Diverticulitis      Past Medical History:  Diagnosis Date   Anxiety    CAD (coronary artery disease), native coronary artery    Diabetes mellitus    Family history of diabetes mellitus    GERD (gastroesophageal reflux disease)    History of melanoma    Hyperlipidemia    Hypertension     Past Surgical History:  Procedure Laterality Date   APPENDECTOMY     CORONARY STENT PLACEMENT  2004   NM MYOVIEW LTD  2010   RENAL ARTERY STENT  2006       HPI  from the history and physical done on the day of admission:   Regina Good  is a 88 y.o. female with pmhx relevant for CAD, T2DM, GERD, HTN, HLD who presents to the ED with complaints of abdominal pain for about 4 days now mostly in the lower abdomen, associated with nausea, patient had episode of emesis here in  the ED -Emesis was without bile or blood No fever  Or chills  -Patient has had hematuria for over 24 hours now -No Melena or hematochezia -In the ED Foley catheter was placed hematuria noted with small clots -UA suggestive of possible UTI -CT abdomen and pelvis with diverticulitis of the descending colon as well as 4.5 cm left renal mass suggestive of renal cell carcinoma with retroperitoneal lymph node presumed metastatic - -EDP discussed finding of renal cell carcinoma with on-call urologist Dr. Abner Greenspan -Additional history obtained from patient's family members at bedside including patient's son [name], patient's Niece Rollene Fare, and patient's niece Margie -WBC 8.0 Hgb 11.2 which is close to baseline, platelets 380 -INR 1.0, lipase 44 -Sodium is 130, creatinine 1.18, BUN 34    Hospital Course:   Assessment and Plan:  Brief Narrative:   88 y.o. female with pmhx relevant for CAD, T2DM, GERD, HTN, HLD admitted on 08/29/2021 with acute diverticulitis as well as hematuria in the setting of left renal cell carcinoma     -Assessment and Plan: 1)Acute Diverticulitis of the Descending Colon--- no evidence  of abscess or perforation -Patient presented with abdominal pain, nausea and vomiting -Did not meet sepsis criteria -Treated with IV Unasyn, okay to discharge on oral Augmentin -Tolerating solid food well -No further nausea or vomiting -Had BM   2) left renal cell carcinoma--with gross hematuria and small clots -CT abdomen and pelvis shows- transitional cell carcinoma at the upper pole of the left kidney measuring at least 4.5 cm in size with locoregional extension and with metastatic lymph nodes in the retroperitoneum at the level of the renal hilum -EDP discussed case with on-call urologist Dr. Iran Ouch removed on 08/31/2021--- voiding well with postvoid residuals of 0 (zero) -Hematuria improved significantly--resolving -Outpatient follow-up with urologist Dr. Alyson Ingles advised -Patient and  family does not desire aggressive/heroic measures/or surgeries if avoidable   3)Social/Ethics-plan of care, CODE STATUS discussed with patient, patient's son [name], patient's Niece Rollene Fare, and patient's niece Margie -Patient and son request DNR status - 4)Hyponatremia--due to nausea vomiting as well as Lasix and Aldactone use Resolved with hydration -Diuretics adjusted  4)History of CAD--- stopped aspirin and Plavix in view of gross hematuria  -Continue metoprolol -Chest pain-free   5)GERD--- continue Protonix   6)HTN--okay to restart metoprolol and lisinopril   7)DM2-A1c 6.4 reflecting good diabetic control PTA  -Resume once daily metformin    8) acute on chronic chronic Anemia--due to acute blood loss/hematuria  -Hgb is 9.1 due to IV fluids and hematuria - baseline usually between 11 and 12 -Repeat CBC in within 1 week  9)Possible UTI---treated with Unasyn as above #1 , discharged on Augmentin   10)CKD stage - 3B---- renal function appears to be close to baseline at this time -Repeat BMP within a week   11)Constipation--- improved with laxatives  Disposition--- discharge home on 09/01/2021    Dispo: The patient is from: Home              Anticipated d/c is to: Home  Discharge Condition: stable  Follow UP   Follow-up Information     [facility].   Specialty: Urology Contact information: [address] [place] [phone]                Diet and Activity recommendation:  As advised  Discharge Instructions    Discharge Instructions     Call MD for:  difficulty breathing, headache or visual disturbances   Complete by: As directed    Call MD for:  persistant dizziness or light-headedness   Complete by: As directed    Call MD for:  persistant nausea and vomiting   Complete by: As directed    Call MD for:  temperature >100.4   Complete by: As directed    Diet - low sodium heart  healthy   Complete by: As directed    Discharge instructions   Complete by: As directed    1)Stop Plavix/Clopidogrel and Stop Aspirin due to bleeding/blood in your urine 2)Avoid ibuprofen/Advil/Aleve/Motrin/Goody Powders/Naproxen/BC powders/Meloxicam/Diclofenac/Indomethacin and other Nonsteroidal anti-inflammatory medications as these will make you more likely to bleed and can cause stomach ulcers, can also cause Kidney problems.  3)Please follow-up with Urologist Dr. Alyson Ingles in about 1 to 2 weeks --- due to concerns about left kidney cancer----[facility], [address], [place] [address] Phone Number----[phone] 4)Please follow-up with your primary care physician in 3 to 5 days for repeat BMP and repeat CBC blood test   Increase activity slowly   Complete by: As directed  Discharge Medications     Allergies as of 09/01/2021       Reactions   Codeine Other (See Comments)   Sensation of being out of this world.   Sulfonamide Derivatives Other (See Comments)   Makes her feel like dreaming while awake.        Medication List     STOP taking these medications    aspirin 81 MG chewable tablet Replaced by: aspirin EC 81 MG tablet   clopidogrel 75 MG tablet Commonly known as: PLAVIX   pantoprazole 20 MG tablet Commonly known as: PROTONIX       TAKE these medications    acetaminophen 325 MG tablet Commonly known as: TYLENOL Take 2 tablets (650 mg total) by mouth every 6 (six) hours as needed for mild pain (or Fever >/= 101).   ALPRAZolam 0.25 MG tablet Commonly known as: XANAX Take 1 tablet (0.25 mg total) by mouth at bedtime as needed for anxiety or sleep. What changed:  when to take this reasons to take this   amoxicillin-clavulanate 500-125 MG tablet Commonly known as: Augmentin Take 1 tablet (500 mg total) by mouth 2 (two) times daily.   aspirin EC 81 MG tablet Take 1 tablet (81 mg total) by mouth daily with  breakfast. Replaces: aspirin 81 MG chewable tablet   atorvastatin 20 MG tablet Commonly known as: Lipitor Take 1 tablet (20 mg total) by mouth daily.   bismuth subsalicylate 381 WE/99BZ suspension Commonly known as: PEPTO BISMOL Take 30 mLs by mouth every 6 (six) hours as needed.   furosemide 40 MG tablet Commonly known as: LASIX Take 0.5 tablets (20 mg total) by mouth every Monday, Wednesday, and Friday. Start taking on: September 02, 2021 What changed: how much to take   magnesium citrate Soln As directed.   metFORMIN 500 MG tablet Commonly known as: GLUCOPHAGE Take 1 tablet (500 mg total) by mouth daily with breakfast. What changed: when to take this   metoprolol succinate 25 MG 24 hr tablet Commonly known as: TOPROL-XL Take 1 tablet (25 mg total) by mouth daily.   omeprazole 20 MG capsule Commonly known as: PRILOSEC Take 1 capsule (20 mg total) by mouth daily.   polyethylene glycol 17 g packet Commonly known as: MIRALAX / GLYCOLAX Take 17 g by mouth daily.   spironolactone 25 MG tablet Commonly known as: ALDACTONE Take 0.5 tablets (12.5 mg total) by mouth daily.   sucralfate 1 g tablet Commonly known as: CARAFATE Take 1 g by mouth 2 (two) times daily.   tamsulosin 0.4 MG Caps capsule Commonly known as: FLOMAX Take 1 capsule (0.4 mg total) by mouth daily after supper.        Major procedures and Radiology Reports - PLEASE review detailed and final reports for all details, in brief -   CT ABDOMEN PELVIS W CONTRAST  Result Date: 08/29/2021 CLINICAL DATA:  Left lower quadrant abdominal pain and low pelvic pain over the last day. EXAM: CT ABDOMEN AND PELVIS WITH CONTRAST TECHNIQUE: Multidetector CT imaging of the abdomen and pelvis was performed using the standard protocol following bolus administration of intravenous contrast. RADIATION DOSE REDUCTION: This exam was performed according to the departmental dose-optimization program which includes automated exposure  control, adjustment of the mA and/or kV according to patient size and/or use of iterative reconstruction technique. CONTRAST:  44m OMNIPAQUE IOHEXOL 300 MG/ML  SOLN COMPARISON:  CT 09/24/2017 FINDINGS: Lower chest: Chronic cardiomegaly. Coronary artery calcification and aortic atherosclerotic calcification. Mild scarring at the  lung bases. Hepatobiliary: Liver appears normal.  No calcified gallstones. Pancreas: Normal Spleen: Normal Adrenals/Urinary Tract: Adrenal glands are normal. The right kidney is normal. The left kidney is poorly functioning in the upper pole is markedly abnormal. Question if this is due to pyelonephritis or if there is a renal mass lesion on the order of 4.5 cm in size. Renal mass lesion is favored, particularly transitional cell carcinoma. No evidence of stone disease. Bladder is normal. Stomach/Bowel: Stomach and small intestine are normal. There is mild edema adjacent to the mid descending colon consistent with acute diverticulitis. No evidence of abscess. Vascular/Lymphatic: Aortic atherosclerosis and branch vessel atherosclerosis. There are 2 abnormal lymph nodes in the left retroperitoneum at the level of the renal hilum worrisome for metastatic disease, measuring 12 x 14 mm and 13 x 15 mm. Reproductive: No pelvic mass. Other: No free fluid or air. Musculoskeletal: Chronic curvature and degenerative change of the spine. Old compression fracture at L3 with mild retropulsion, unchanged since 2019. IMPRESSION: Acute diverticulitis of the mid descending colon. No evidence of abscess or free air. 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 Possibility of pyelonephritis or super infection does exist, but I think it is quite likely that this is a renal malignancy. There was  no sign of this on the prior CT of July 2019. Aortic atherosclerosis. Old L3 compression fracture. Electronically Signed   By: Nelson Chimes M.D.   On: 08/29/2021 09:21    Micro Results   Today   Subjective    Regina Good today has no new complaints -Hematuria resolving, no clots -Eating and drinking well -Had BM   Patient has been seen and examined prior to discharge   Objective   Blood pressure (!) 155/63, pulse 91, temperature (!) 97.1 F (36.2 C), resp. rate 16, height '5\' 5"'$  (1.651 m), weight 47.6 kg, SpO2 97 %.   Intake/Output Summary (Last 24 hours) at 09/01/2021 1101 Last data filed at 09/01/2021 0900 Gross per 24 hour  Intake 240 ml  Output 900 ml  Net -660 ml    Exam Gen:- Awake Alert, no acute distress  HEENT:- [place].AT, No sclera icterus Neck-Supple Neck,No JVD,.  Lungs-  CTAB , good air movement bilaterally CV- S1, S2 normal, regular Abd-  +ve B.Sounds, Abd Soft, No tenderness,    Extremity/Skin:- No  edema,   good pulses Psych-affect is appropriate, oriented x3 Neuro-no new focal deficits, no tremors    Data Review   CBC w Diff:  Lab Results  Component Value Date   WBC 9.1 09/01/2021   HGB 9.1 (L) 09/01/2021   HCT 29.1 (L) 09/01/2021   PLT 335 09/01/2021   LYMPHOPCT 23 08/29/2021   MONOPCT 7 08/29/2021   EOSPCT 5 08/29/2021   BASOPCT 1 08/29/2021    CMP:  Lab Results  Component Value Date   NA 135 09/01/2021   K 4.3 09/01/2021   CL 104 09/01/2021   CO2 26 09/01/2021   BUN 36 (H) 09/01/2021   CREATININE 1.30 (H) 09/01/2021   PROT 7.5 08/29/2021   ALBUMIN 3.6 08/29/2021   BILITOT 0.7 08/29/2021   ALKPHOS 52 08/29/2021   AST 16 08/29/2021   ALT 11 08/29/2021  . Total Discharge time is about 33 minutes  Roxan Hockey M.D on 09/01/2021 at  11:01 AM  Go to [email or web address] -  for contact info  [facility] - Office  [phone]

## 2021-09-01 NOTE — Progress Notes (Signed)
Discharge instructions read to patient   she verbalized understanding of all instructions.  Discharged to home with family

## 2021-09-04 ENCOUNTER — Ambulatory Visit: Payer: Medicare Other | Admitting: Physician Assistant

## 2021-09-04 VITALS — BP 161/80 | HR 99 | Ht 65.0 in | Wt 107.0 lb

## 2021-09-04 DIAGNOSIS — C649 Malignant neoplasm of unspecified kidney, except renal pelvis: Secondary | ICD-10-CM

## 2021-09-04 DIAGNOSIS — R310 Gross hematuria: Secondary | ICD-10-CM

## 2021-09-04 LAB — URINALYSIS, ROUTINE W REFLEX MICROSCOPIC
Bilirubin, UA: NEGATIVE
Glucose, UA: NEGATIVE
Nitrite, UA: NEGATIVE
Specific Gravity, UA: 1.015 (ref 1.005–1.030)
Urobilinogen, Ur: 2 mg/dL — ABNORMAL HIGH (ref 0.2–1.0)
pH, UA: 8.5 — ABNORMAL HIGH (ref 5.0–7.5)

## 2021-09-04 LAB — MICROSCOPIC EXAMINATION
Bacteria, UA: NONE SEEN
Epithelial Cells (non renal): NONE SEEN /hpf (ref 0–10)
RBC, Urine: >30 /hpf — AB (ref 0–2)
Renal Epithel, UA: NONE SEEN /hpf
WBC, UA: NONE SEEN /hpf (ref 0–5)

## 2021-09-04 NOTE — Patient Instructions (Signed)
FU with Dr. Tammi Klippel for evaluation of palliative radiation therapy to address bleeding from left kidney and blood in urine.

## 2021-09-04 NOTE — Progress Notes (Signed)
09/04/2021 11:29 AM   Regina Good [birth year] [id number]   Assessment:  1. Malignant neoplasm of kidney, unspecified laterality (HCC) - Urinalysis, Routine w reflex microscopic - Ambulatory referral to Radiation Oncology - Cytology, urine  2. Gross hematuria - Ambulatory referral to Radiation Oncology - Urine Culture - Cytology, urine    Plan: The patient's urine is sent for culture today as well as cytology.  Discussed findings and plan of care with palliative treatment options at length with the patient and her caretaker.  Recommended consult with radiation oncology for palliative treatments to help minimize bleeding from this lesion.  Order is placed for the consult and the patient and her caretaker will discuss with the patient's family.  Patient conveys understanding that her diagnosis comes with a life expectancy of 3 to 6 months and that palliative treatment is an option.  She understands there are no surgical or oncology treatments that are curative at this time. Pt also given instructions to go to the ED if she develops sxs of anemia due to hematuria. The pt was also seen by Dr. Alyson Ingles today.  Chief Complaint: Regina Good hematuria  Referring provider:  Celene Squibb, MD [place],  [place] [address]   History of Present Illness:  Regina Good is a 88 y.o. year old female who is seen in consultation from Celene Squibb, MD for evaluation of incidental finding of left-sided renal mass consistent with transitional cell carcinoma with nodal involvement as seen on CT exam obtained in the emergency department when the patient presented last week with abdominal pain and constipation.  Patient presents today with her nurse and friend who is her caretaker.  Patient's nurse conveys that the family met with Dr. Abner Greenspan while the patient was hospitalized last week and have decided on no intervention for the patient.  Since that time, the patient has begun having gross hematuria  without pain, burning, dysuria, urgency or frequency.  She denies change in energy levels and has had no dizziness or lightheadedness since onset of hematuria.  She understands that she has been diagnosed with kidney cancer and that there is no cure at this stage.  Medical records from emergency department visit including labs and imaging reports and images reviewed during the patient's office visit.  CT of the abdomen and pelvis with contrast reveals no obvious abnormalities of the right kidney.  The left kidney reveals approximately 4.5 to 5 cm lesion in the renal pelvis consistent with transitional cell carcinoma.  Hilar nodes concerning for metastasis.  Images reviewed during the patient's office visit with Dr. Alyson Ingles who agrees with diagnosis of transitional cell carcinoma.  Urinalysis is positive for gross hematuria with too numerous to count red cells.  No white cells or bacteria could be identified. H/H 9.1/29.1. Pt denies dizziness, ShoB, light-headedness  Portions of the above documentation were copied from a prior visit for review purposes only  Past Medical History:  Past Medical History:  Diagnosis Date   Anxiety    CAD (coronary artery disease), native coronary artery    Diabetes mellitus    Family history of diabetes mellitus    GERD (gastroesophageal reflux disease)    History of melanoma    Hyperlipidemia    Hypertension     Past Surgical History:  Past Surgical History:  Procedure Laterality Date   APPENDECTOMY     CORONARY STENT PLACEMENT  2004   NM MYOVIEW LTD  2010   RENAL ARTERY STENT  2006    Allergies:  Allergies  Allergen Reactions   Codeine Other (See Comments)    Sensation of being out of this world.   Sulfonamide Derivatives Other (See Comments)    Makes her feel like dreaming while awake.    Family History:  Family History  Problem Relation Age of Onset   Pancreatic cancer Mother    Coronary artery disease Father    Diabetes Brother    Colon  cancer Sister    Stroke Sister    Stroke Sister    Diabetes Son    Stroke Son    Schizophrenia Daughter     Social History:  Social History   Tobacco Use   Smoking status: Never   Smokeless tobacco: Never  Vaping Use   Vaping Use: Never used  Substance Use Topics   Alcohol use: No   Drug use: No    Review of symptoms:  Constitutional:  Negative for night sweats, fever, chills ENT:  Negative for nose bleeds, sinus pain, painful swallowing CV:  Negative for chest pain, shortness of breath, palpitations, loss of consciousness Resp:  Negative for cough, wheezing, shortness of breath GI:  Negative for nausea, vomiting, diarrhea, bloody stools GU:  Positives noted in HPI; otherwise negative for dysuria Neuro:  Negative for seizures, poor balance, limb weakness, slurred speech Endocrine:  Negative for polydipsia, polyuria, symptoms of hypoglycemia (dizziness, hunger, sweating)  Physical Exam: BP (!) 161/80   Pulse 99   Ht 5' 5"  (1.651 m)   Wt 107 lb (48.5 kg)   BMI 17.81 kg/m   Constitutional:  Thin, well developed, alert and oriented, No acute distress.  HEENT: NCAT, moist mucus membranes.  Trachea midline, no masses. Cardiovascular: Regular rate and rhythm without murmur, rub, or gallops No clubbing, cyanosis, or edema. Respiratory: Normal respiratory effort, clear to auscultation bilaterally GI: Abdomen is soft, nontender, nondistended, no abdominal masses BACK:  No CVAT Skin: No obvious rashes, warm, dry, intact Neurologic: Alert and oriented, Cranial nerves grossly intact, no focal deficits, moving all 4 extremities, ambulates independently with a walker for assist. Psychiatric: Appropriate. Normal mood and affect.  Laboratory Data: Results for orders placed or performed in visit on 09/04/21 (from the past 24 hour(s))  Microscopic Examination   Collection Time: 09/04/21  1:22 PM   Urine  Result Value Ref Range   WBC, UA None seen 0 - 5 /hpf   RBC >30 (A) 0 - 2  /hpf   Epithelial Cells (non renal) None seen 0 - 10 /hpf   Renal Epithel, UA None seen None seen /hpf   Bacteria, UA None seen None seen/Few  Urinalysis, Routine w reflex microscopic   Collection Time: 09/04/21  1:22 PM  Result Value Ref Range   Specific Gravity, UA 1.015 1.005 - 1.030   pH, UA 8.5 (H) 5.0 - 7.5   Color, UA Red (A) Yellow   Appearance Ur Cloudy (A) Clear   Leukocytes,UA 3+ (A) Negative   Protein,UA 3+ (A) Negative/Trace   Glucose, UA Negative Negative   Ketones, UA 1+ (A) Negative   RBC, UA 3+ (A) Negative   Bilirubin, UA Negative Negative   Urobilinogen, Ur 2.0 (H) 0.2 - 1.0 mg/dL   Nitrite, UA Negative Negative   Microscopic Examination See below:     Lab Results  Component Value Date   WBC 9.1 09/01/2021   HGB 9.1 (L) 09/01/2021   HCT 29.1 (L) 09/01/2021   MCV 85.1 09/01/2021   PLT 335 09/01/2021  Lab Results  Component Value Date   CREATININE 1.30 (H) 09/01/2021     Lab Results  Component Value Date   HGBA1C 6.4 (H) 05/01/2021    Urinalysis    Component Value Date/Time   COLORURINE YELLOW 08/29/2021 1122   APPEARANCEUR HAZY (A) 08/29/2021 1122   LABSPEC 1.028 08/29/2021 1122   PHURINE 7.0 08/29/2021 1122   GLUCOSEU NEGATIVE 08/29/2021 1122   HGBUR LARGE (A) 08/29/2021 1122   HGBUR trace-intact 07/27/2008 1336   BILIRUBINUR NEGATIVE 08/29/2021 1122   KETONESUR NEGATIVE 08/29/2021 1122   PROTEINUR 100 (A) 08/29/2021 1122   UROBILINOGEN 0.2 01/19/2014 0936   NITRITE NEGATIVE 08/29/2021 1122   LEUKOCYTESUR TRACE (A) 08/29/2021 1122    Lab Results  Component Value Date   BACTERIA NONE SEEN 08/29/2021    Pertinent Imaging: No results found for this or any previous visit.  No results found for this or any previous visit.  [place], Berneice Heinrich, PA-C [facility] [place]

## 2021-09-05 ENCOUNTER — Telehealth: Payer: Self-pay | Admitting: Radiation Oncology

## 2021-09-05 LAB — CYTOLOGY, URINE

## 2021-09-05 NOTE — Telephone Encounter (Signed)
Called patient to scheduled a consultation w. Dr. Tammi Klippel. Patient is undecided if she wants [place], she stated she will call back to schedule appointment.

## 2021-09-07 DIAGNOSIS — C642 Malignant neoplasm of left kidney, except renal pelvis: Secondary | ICD-10-CM | POA: Insufficient documentation

## 2021-09-07 LAB — URINE CULTURE: Organism ID, Bacteria: NO GROWTH

## 2021-09-09 ENCOUNTER — Ambulatory Visit
Admission: RE | Admit: 2021-09-09 | Discharge: 2021-09-09 | Disposition: A | Discharge status: Home / Self Care | Payer: Medicare Other | Source: Ambulatory Visit | Attending: Radiation Oncology | Admitting: Radiation Oncology

## 2021-09-09 ENCOUNTER — Other Ambulatory Visit: Payer: Self-pay

## 2021-09-09 VITALS — BP 150/62 | HR 79 | Temp 97.9°F | Wt 107.2 lb

## 2021-09-09 VITALS — BP 150/62 | HR 79 | Temp 97.9°F | Resp 18 | Wt 107.2 lb

## 2021-09-09 DIAGNOSIS — I10 Essential (primary) hypertension: Secondary | ICD-10-CM | POA: Insufficient documentation

## 2021-09-09 DIAGNOSIS — E119 Type 2 diabetes mellitus without complications: Secondary | ICD-10-CM | POA: Diagnosis not present

## 2021-09-09 DIAGNOSIS — I2510 Atherosclerotic heart disease of native coronary artery without angina pectoris: Secondary | ICD-10-CM | POA: Diagnosis not present

## 2021-09-09 DIAGNOSIS — Z8781 Personal history of (healed) traumatic fracture: Secondary | ICD-10-CM | POA: Insufficient documentation

## 2021-09-09 DIAGNOSIS — Z800 Family history of malignant neoplasm of digestive organs: Secondary | ICD-10-CM | POA: Diagnosis not present

## 2021-09-09 DIAGNOSIS — C642 Malignant neoplasm of left kidney, except renal pelvis: Secondary | ICD-10-CM | POA: Insufficient documentation

## 2021-09-09 DIAGNOSIS — E785 Hyperlipidemia, unspecified: Secondary | ICD-10-CM | POA: Diagnosis not present

## 2021-09-09 DIAGNOSIS — K219 Gastro-esophageal reflux disease without esophagitis: Secondary | ICD-10-CM | POA: Insufficient documentation

## 2021-09-09 DIAGNOSIS — I700 Atherosclerosis of aorta: Secondary | ICD-10-CM | POA: Insufficient documentation

## 2021-09-24 ENCOUNTER — Telehealth: Payer: Self-pay

## 2021-09-24 DIAGNOSIS — C642 Malignant neoplasm of left kidney, except renal pelvis: Secondary | ICD-10-CM | POA: Diagnosis not present

## 2021-09-24 NOTE — Telephone Encounter (Signed)
RN called and verified identity spoke with Mrs. Boozer's caregiver Butch Penny Summer) stated that Regina Good decided she no longer interested in radiation treatment and to go ahead and cancel future visits.

## 2021-09-25 ENCOUNTER — Ambulatory Visit: Payer: Medicare Other | Admitting: Radiation Oncology

## 2021-09-26 ENCOUNTER — Ambulatory Visit: Payer: Medicare Other | Admitting: Radiation Oncology

## 2021-09-27 ENCOUNTER — Ambulatory Visit: Payer: Medicare Other | Admitting: Radiation Oncology

## 2021-09-30 ENCOUNTER — Encounter: Payer: Self-pay | Admitting: Radiation Oncology

## 2021-09-30 ENCOUNTER — Ambulatory Visit: Payer: Medicare Other | Admitting: Radiation Oncology

## 2021-10-01 ENCOUNTER — Ambulatory Visit: Payer: Medicare Other | Admitting: Radiation Oncology

## 2021-10-01 DIAGNOSIS — R110 Nausea: Secondary | ICD-10-CM | POA: Diagnosis not present

## 2021-10-01 DIAGNOSIS — G4700 Insomnia, unspecified: Secondary | ICD-10-CM | POA: Diagnosis not present

## 2021-10-01 DIAGNOSIS — Z515 Encounter for palliative care: Secondary | ICD-10-CM | POA: Diagnosis not present

## 2021-10-02 ENCOUNTER — Ambulatory Visit: Payer: Medicare Other | Admitting: Radiation Oncology

## 2021-10-02 ENCOUNTER — Ambulatory Visit: Payer: Medicare Other | Admitting: Urology

## 2021-10-02 DIAGNOSIS — C649 Malignant neoplasm of unspecified kidney, except renal pelvis: Secondary | ICD-10-CM

## 2021-10-03 DIAGNOSIS — C642 Malignant neoplasm of left kidney, except renal pelvis: Secondary | ICD-10-CM | POA: Diagnosis not present

## 2021-10-04 ENCOUNTER — Ambulatory Visit: Payer: Medicare Other

## 2021-10-04 ENCOUNTER — Ambulatory Visit: Payer: Medicare Other | Admitting: Radiation Oncology

## 2021-11-04 NOTE — Progress Notes (Signed)
  Radiation Oncology         [phone] ________________________________  Name: Regina Good MRN: [id number]  Date: 09/30/2021  DOB: [birth year]  Chart Note:  This patient was set up for palliative radiation and underwent treatment planning, then, her family changed course and elected to forgo radiation.  ________________________________  Sheral Apley Tammi Klippel, M.D.

## 2021-11-25 ENCOUNTER — Emergency Department (HOSPITAL_COMMUNITY)

## 2021-11-25 ENCOUNTER — Inpatient Hospital Stay (HOSPITAL_COMMUNITY)
Admission: EM | Admit: 2021-11-25 | Discharge: 2021-11-29 | DRG: 871 | Disposition: A | Discharge status: Hospice | Attending: Family Medicine | Admitting: Family Medicine

## 2021-11-25 ENCOUNTER — Encounter (HOSPITAL_COMMUNITY): Payer: Self-pay | Admitting: *Deleted

## 2021-11-25 DIAGNOSIS — Z8249 Family history of ischemic heart disease and other diseases of the circulatory system: Secondary | ICD-10-CM

## 2021-11-25 DIAGNOSIS — A419 Sepsis, unspecified organism: Secondary | ICD-10-CM | POA: Diagnosis not present

## 2021-11-25 DIAGNOSIS — I2510 Atherosclerotic heart disease of native coronary artery without angina pectoris: Secondary | ICD-10-CM | POA: Diagnosis not present

## 2021-11-25 DIAGNOSIS — E871 Hypo-osmolality and hyponatremia: Secondary | ICD-10-CM | POA: Diagnosis present

## 2021-11-25 DIAGNOSIS — Z955 Presence of coronary angioplasty implant and graft: Secondary | ICD-10-CM

## 2021-11-25 DIAGNOSIS — J189 Pneumonia, unspecified organism: Secondary | ICD-10-CM | POA: Diagnosis present

## 2021-11-25 DIAGNOSIS — Z7984 Long term (current) use of oral hypoglycemic drugs: Secondary | ICD-10-CM

## 2021-11-25 DIAGNOSIS — Z20822 Contact with and (suspected) exposure to covid-19: Secondary | ICD-10-CM | POA: Diagnosis present

## 2021-11-25 DIAGNOSIS — I129 Hypertensive chronic kidney disease with stage 1 through stage 4 chronic kidney disease, or unspecified chronic kidney disease: Secondary | ICD-10-CM | POA: Diagnosis not present

## 2021-11-25 DIAGNOSIS — I10 Essential (primary) hypertension: Secondary | ICD-10-CM | POA: Diagnosis not present

## 2021-11-25 DIAGNOSIS — R5381 Other malaise: Secondary | ICD-10-CM | POA: Diagnosis present

## 2021-11-25 DIAGNOSIS — Z800 Family history of malignant neoplasm of digestive organs: Secondary | ICD-10-CM

## 2021-11-25 DIAGNOSIS — C799 Secondary malignant neoplasm of unspecified site: Secondary | ICD-10-CM | POA: Diagnosis present

## 2021-11-25 DIAGNOSIS — C642 Malignant neoplasm of left kidney, except renal pelvis: Secondary | ICD-10-CM | POA: Diagnosis present

## 2021-11-25 DIAGNOSIS — N1832 Chronic kidney disease, stage 3b: Secondary | ICD-10-CM | POA: Diagnosis present

## 2021-11-25 DIAGNOSIS — R0902 Hypoxemia: Secondary | ICD-10-CM | POA: Diagnosis not present

## 2021-11-25 DIAGNOSIS — K625 Hemorrhage of anus and rectum: Secondary | ICD-10-CM | POA: Diagnosis not present

## 2021-11-25 DIAGNOSIS — R6889 Other general symptoms and signs: Secondary | ICD-10-CM | POA: Diagnosis not present

## 2021-11-25 DIAGNOSIS — R0602 Shortness of breath: Secondary | ICD-10-CM | POA: Diagnosis not present

## 2021-11-25 DIAGNOSIS — N183 Chronic kidney disease, stage 3 unspecified: Secondary | ICD-10-CM | POA: Diagnosis present

## 2021-11-25 DIAGNOSIS — F419 Anxiety disorder, unspecified: Secondary | ICD-10-CM | POA: Diagnosis present

## 2021-11-25 DIAGNOSIS — Z515 Encounter for palliative care: Secondary | ICD-10-CM

## 2021-11-25 DIAGNOSIS — K219 Gastro-esophageal reflux disease without esophagitis: Secondary | ICD-10-CM | POA: Diagnosis present

## 2021-11-25 DIAGNOSIS — E860 Dehydration: Secondary | ICD-10-CM | POA: Diagnosis present

## 2021-11-25 DIAGNOSIS — Z833 Family history of diabetes mellitus: Secondary | ICD-10-CM | POA: Diagnosis not present

## 2021-11-25 DIAGNOSIS — K5900 Constipation, unspecified: Secondary | ICD-10-CM | POA: Diagnosis not present

## 2021-11-25 DIAGNOSIS — K649 Unspecified hemorrhoids: Secondary | ICD-10-CM | POA: Diagnosis not present

## 2021-11-25 DIAGNOSIS — S0990XA Unspecified injury of head, initial encounter: Secondary | ICD-10-CM | POA: Diagnosis not present

## 2021-11-25 DIAGNOSIS — D62 Acute posthemorrhagic anemia: Secondary | ICD-10-CM | POA: Diagnosis not present

## 2021-11-25 DIAGNOSIS — E785 Hyperlipidemia, unspecified: Secondary | ICD-10-CM | POA: Diagnosis present

## 2021-11-25 DIAGNOSIS — E1122 Type 2 diabetes mellitus with diabetic chronic kidney disease: Secondary | ICD-10-CM | POA: Diagnosis not present

## 2021-11-25 DIAGNOSIS — D631 Anemia in chronic kidney disease: Secondary | ICD-10-CM | POA: Diagnosis present

## 2021-11-25 DIAGNOSIS — Z882 Allergy status to sulfonamides status: Secondary | ICD-10-CM

## 2021-11-25 DIAGNOSIS — Z66 Do not resuscitate: Secondary | ICD-10-CM | POA: Diagnosis not present

## 2021-11-25 DIAGNOSIS — Z743 Need for continuous supervision: Secondary | ICD-10-CM | POA: Diagnosis not present

## 2021-11-25 DIAGNOSIS — R531 Weakness: Secondary | ICD-10-CM | POA: Diagnosis not present

## 2021-11-25 DIAGNOSIS — J90 Pleural effusion, not elsewhere classified: Secondary | ICD-10-CM | POA: Diagnosis not present

## 2021-11-25 DIAGNOSIS — Z7401 Bed confinement status: Secondary | ICD-10-CM | POA: Diagnosis not present

## 2021-11-25 DIAGNOSIS — Z85820 Personal history of malignant melanoma of skin: Secondary | ICD-10-CM

## 2021-11-25 DIAGNOSIS — J9811 Atelectasis: Secondary | ICD-10-CM | POA: Diagnosis not present

## 2021-11-25 DIAGNOSIS — Z885 Allergy status to narcotic agent status: Secondary | ICD-10-CM

## 2021-11-25 DIAGNOSIS — Z7982 Long term (current) use of aspirin: Secondary | ICD-10-CM

## 2021-11-25 DIAGNOSIS — Z79899 Other long term (current) drug therapy: Secondary | ICD-10-CM

## 2021-11-25 LAB — CBC WITH DIFFERENTIAL/PLATELET
Abs Immature Granulocytes: 0.07 10*3/uL (ref 0.00–0.07)
Basophils Absolute: 0 10*3/uL (ref 0.0–0.1)
Basophils Relative: 0 %
Eosinophils Absolute: 0 10*3/uL (ref 0.0–0.5)
Eosinophils Relative: 0 %
HCT: 24.7 % — ABNORMAL LOW (ref 36.0–46.0)
Hemoglobin: 7.6 g/dL — ABNORMAL LOW (ref 12.0–15.0)
Immature Granulocytes: 1 %
Lymphocytes Relative: 6 %
Lymphs Abs: 0.8 10*3/uL (ref 0.7–4.0)
MCH: 23.3 pg — ABNORMAL LOW (ref 26.0–34.0)
MCHC: 30.8 g/dL (ref 30.0–36.0)
MCV: 75.8 fL — ABNORMAL LOW (ref 80.0–100.0)
Monocytes Absolute: 0.6 10*3/uL (ref 0.1–1.0)
Monocytes Relative: 5 %
Neutro Abs: 11.2 10*3/uL — ABNORMAL HIGH (ref 1.7–7.7)
Neutrophils Relative %: 88 %
Platelets: 643 10*3/uL — ABNORMAL HIGH (ref 150–400)
RBC: 3.26 MIL/uL — ABNORMAL LOW (ref 3.87–5.11)
RDW: 14.9 % (ref 11.5–15.5)
WBC: 12.7 10*3/uL — ABNORMAL HIGH (ref 4.0–10.5)
nRBC: 0 % (ref 0.0–0.2)

## 2021-11-25 LAB — BASIC METABOLIC PANEL
Anion gap: 10 (ref 5–15)
BUN: 33 mg/dL — ABNORMAL HIGH (ref 8–23)
CO2: 24 mmol/L (ref 22–32)
Calcium: 8.3 mg/dL — ABNORMAL LOW (ref 8.9–10.3)
Chloride: 99 mmol/L (ref 98–111)
Creatinine, Ser: 1.41 mg/dL — ABNORMAL HIGH (ref 0.44–1.00)
GFR, Estimated: 34 mL/min — ABNORMAL LOW (ref >60)
Glucose, Bld: 178 mg/dL — ABNORMAL HIGH (ref 70–99)
Potassium: 5 mmol/L (ref 3.5–5.1)
Sodium: 133 mmol/L — ABNORMAL LOW (ref 135–145)

## 2021-11-25 LAB — URINALYSIS, ROUTINE W REFLEX MICROSCOPIC
Bilirubin Urine: NEGATIVE
Glucose, UA: NEGATIVE mg/dL
Ketones, ur: NEGATIVE mg/dL
Nitrite: NEGATIVE
Protein, ur: 100 mg/dL — AB
RBC / HPF: >50 RBC/hpf — ABNORMAL HIGH (ref 0–5)
Specific Gravity, Urine: 1.021 (ref 1.005–1.030)
WBC, UA: >50 WBC/hpf — ABNORMAL HIGH (ref 0–5)
pH: 7 (ref 5.0–8.0)

## 2021-11-25 LAB — GLUCOSE, CAPILLARY
Glucose-Capillary: 160 mg/dL — ABNORMAL HIGH (ref 70–99)
Glucose-Capillary: 138 mg/dL — ABNORMAL HIGH (ref 70–99)

## 2021-11-25 LAB — SARS CORONAVIRUS 2 BY RT PCR: SARS Coronavirus 2 by RT PCR: NEGATIVE

## 2021-11-25 MED ORDER — SODIUM CHLORIDE 0.9 % IV SOLN
500.0000 mg | INTRAVENOUS | Status: DC
  Administered 2021-11-25 – 2021-11-27 (×3): 500 mg via INTRAVENOUS
  Filled 2021-11-25 (×3): qty 5

## 2021-11-25 MED ORDER — SPIRONOLACTONE 12.5 MG HALF TABLET
12.5000 mg | ORAL_TABLET | Freq: Every day | ORAL | Status: DC
  Administered 2021-11-26: 12.5 mg via ORAL
  Filled 2021-11-25: qty 1

## 2021-11-25 MED ORDER — SODIUM CHLORIDE 0.9 % IV SOLN
1.0000 g | Freq: Once | INTRAVENOUS | Status: AC
  Administered 2021-11-25: 1 g via INTRAVENOUS
  Filled 2021-11-25: qty 10

## 2021-11-25 MED ORDER — ONDANSETRON HCL 4 MG PO TABS: 4.0000 mg | ORAL_TABLET | Freq: Four times a day (QID) | ORAL | Status: DC | PRN

## 2021-11-25 MED ORDER — ONDANSETRON HCL 4 MG/2ML IJ SOLN
4.0000 mg | Freq: Four times a day (QID) | INTRAMUSCULAR | Status: DC | PRN
  Administered 2021-11-25 – 2021-11-28 (×4): 4 mg via INTRAVENOUS
  Filled 2021-11-25 (×4): qty 2

## 2021-11-25 MED ORDER — POLYETHYLENE GLYCOL 3350 17 G PO PACK: 17.0000 g | PACK | Freq: Every day | ORAL | Status: DC | PRN

## 2021-11-25 MED ORDER — TAMSULOSIN HCL 0.4 MG PO CAPS
0.4000 mg | ORAL_CAPSULE | Freq: Every day | ORAL | Status: DC
  Administered 2021-11-25 – 2021-11-27 (×3): 0.4 mg via ORAL
  Filled 2021-11-25 (×3): qty 1

## 2021-11-25 MED ORDER — ALPRAZOLAM 0.25 MG PO TABS
0.2500 mg | ORAL_TABLET | Freq: Every evening | ORAL | Status: DC | PRN
  Administered 2021-11-25 – 2021-11-28 (×4): 0.25 mg via ORAL
  Filled 2021-11-25 (×4): qty 1

## 2021-11-25 MED ORDER — SODIUM CHLORIDE 0.9 % IV SOLN: INTRAVENOUS | Status: DC

## 2021-11-25 MED ORDER — SODIUM CHLORIDE 0.9% FLUSH: 3.0000 mL | INTRAVENOUS | Status: DC | PRN

## 2021-11-25 MED ORDER — METOPROLOL SUCCINATE ER 25 MG PO TB24
25.0000 mg | ORAL_TABLET | Freq: Every day | ORAL | Status: DC
  Administered 2021-11-26 – 2021-11-29 (×4): 25 mg via ORAL
  Filled 2021-11-25 (×4): qty 1

## 2021-11-25 MED ORDER — SODIUM CHLORIDE 0.9 % IV SOLN
1.0000 g | INTRAVENOUS | Status: DC
  Administered 2021-11-26 – 2021-11-27 (×2): 1 g via INTRAVENOUS
  Filled 2021-11-25 (×3): qty 10

## 2021-11-25 MED ORDER — POLYETHYLENE GLYCOL 3350 17 G PO PACK
17.0000 g | PACK | Freq: Every day | ORAL | Status: DC
  Administered 2021-11-26: 17 g via ORAL
  Filled 2021-11-25: qty 1

## 2021-11-25 MED ORDER — SODIUM CHLORIDE 0.9% FLUSH
3.0000 mL | Freq: Two times a day (BID) | INTRAVENOUS | Status: DC
  Administered 2021-11-25 – 2021-11-29 (×5): 3 mL via INTRAVENOUS

## 2021-11-25 MED ORDER — HEPARIN SODIUM (PORCINE) 5000 UNIT/ML IJ SOLN
5000.0000 [IU] | Freq: Three times a day (TID) | INTRAMUSCULAR | Status: DC
  Administered 2021-11-26 – 2021-11-28 (×7): 5000 [IU] via SUBCUTANEOUS
  Filled 2021-11-25 (×8): qty 1

## 2021-11-25 MED ORDER — SUCRALFATE 1 G PO TABS
1.0000 g | ORAL_TABLET | Freq: Three times a day (TID) | ORAL | Status: DC
  Administered 2021-11-25 – 2021-11-29 (×10): 1 g via ORAL
  Filled 2021-11-25 (×10): qty 1

## 2021-11-25 MED ORDER — ACETAMINOPHEN 325 MG PO TABS
650.0000 mg | ORAL_TABLET | Freq: Four times a day (QID) | ORAL | Status: DC | PRN
  Administered 2021-11-25 – 2021-11-28 (×4): 650 mg via ORAL
  Filled 2021-11-25 (×4): qty 2

## 2021-11-25 MED ORDER — PANTOPRAZOLE SODIUM 40 MG PO TBEC
40.0000 mg | DELAYED_RELEASE_TABLET | Freq: Every day | ORAL | Status: DC
  Administered 2021-11-25 – 2021-11-29 (×5): 40 mg via ORAL
  Filled 2021-11-25 (×5): qty 1

## 2021-11-25 MED ORDER — TRAZODONE HCL 50 MG PO TABS
50.0000 mg | ORAL_TABLET | Freq: Every evening | ORAL | Status: DC | PRN
  Administered 2021-11-25 – 2021-11-26 (×2): 50 mg via ORAL
  Filled 2021-11-25 (×3): qty 1

## 2021-11-25 MED ORDER — ACETAMINOPHEN 650 MG RE SUPP: 650.0000 mg | Freq: Four times a day (QID) | RECTAL | Status: DC | PRN

## 2021-11-25 MED ORDER — BISACODYL 10 MG RE SUPP
10.0000 mg | Freq: Every day | RECTAL | Status: DC | PRN
  Administered 2021-11-26: 10 mg via RECTAL
  Filled 2021-11-25: qty 1

## 2021-11-25 MED ORDER — INSULIN ASPART 100 UNIT/ML IJ SOLN: 0.0000 [IU] | Freq: Every day | INTRAMUSCULAR | Status: DC

## 2021-11-25 MED ORDER — LACTATED RINGERS IV BOLUS
1000.0000 mL | Freq: Once | INTRAVENOUS | Status: AC
  Administered 2021-11-25: 1000 mL via INTRAVENOUS

## 2021-11-25 MED ORDER — ASPIRIN 81 MG PO TBEC
81.0000 mg | DELAYED_RELEASE_TABLET | Freq: Every day | ORAL | Status: DC
  Administered 2021-11-26 – 2021-11-29 (×4): 81 mg via ORAL
  Filled 2021-11-25 (×4): qty 1

## 2021-11-25 MED ORDER — SODIUM CHLORIDE 0.9 % IV SOLN: INTRAVENOUS | Status: DC | PRN

## 2021-11-25 MED ORDER — ONDANSETRON HCL 4 MG/2ML IJ SOLN
4.0000 mg | Freq: Once | INTRAMUSCULAR | Status: AC
  Administered 2021-11-25: 4 mg via INTRAVENOUS
  Filled 2021-11-25: qty 2

## 2021-11-25 MED ORDER — INSULIN ASPART 100 UNIT/ML IJ SOLN
0.0000 [IU] | Freq: Three times a day (TID) | INTRAMUSCULAR | Status: DC
  Administered 2021-11-25: 1 [IU] via SUBCUTANEOUS
  Administered 2021-11-26: 2 [IU] via SUBCUTANEOUS
  Administered 2021-11-27: 3 [IU] via SUBCUTANEOUS
  Administered 2021-11-27 – 2021-11-28 (×2): 2 [IU] via SUBCUTANEOUS
  Administered 2021-11-28: 1 [IU] via SUBCUTANEOUS
  Administered 2021-11-28 – 2021-11-29 (×3): 2 [IU] via SUBCUTANEOUS

## 2021-11-25 MED ORDER — SODIUM CHLORIDE 0.9% FLUSH
3.0000 mL | Freq: Two times a day (BID) | INTRAVENOUS | Status: DC
  Administered 2021-11-28 – 2021-11-29 (×2): 3 mL via INTRAVENOUS

## 2021-11-25 NOTE — Progress Notes (Signed)
[facility] ED [address] 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 Regina Good is normally relatively independent and active, but over the last few days was noted to be lethargic and sleeping most of the day. The decision was made to send her to the Emergency Department for further evaluation. She is admitted with PNA. This is a related hospital admission per Dr. Alferd Patee, [facility] MD.  Report exchanged with ED provider and attending. Visited at the bed. Niece Regina Good with her. She is alert and oriented, c/o left lateral chest pain. Regina Good states she is much improved since the IVF and antibiotics. Pt reports she has felt "terrible" the last few days and was surprised that she had PNA as she has "never had PNA before". Regina Good has been having issues remembering to take her medications and just a general decline in her overall functionality. Hospice social worker has been looking for LTC for her, however Regina Good vacillates between going to a facility versus staying at home. She is amenable to an admission to treat her PNA and to have PT/OT weigh in on how she is doing, mobility wise.  V/S: 137/79, HR 86, RR 20, SPO2 96% on RA, 98.5 oral  I&O: 1350/300 Labs: Na 133, glucose 178, Cr 1.41, BUN 33, GFR 34, WBC 12.7, hgb 7.6, HCT 24.7, plt 643, UA cloudy/red with ketones, glucose and protein Diagnostics: Portable CXR - Cardiomegaly with interstitial and scattered heterogeneous airspace opacities throughout the lungs and probable small layering pleural effusions, findings consistent with edema and or infection IV/PRN: zofran 4 mg IV x 1, zothromax 500 mg IV QD, rocephin 1 g IV x 1, LR 1 L bolus IV x 1  Regina Good is inpatient appropriate for treatment of PNA requiring IV abx.   Problem List: - CAP - abx as above, was encephalopathic however her mental status has improved to her  baseline - functional decline - per patient she cannot walk anymore, not clear if this is since she has been sick or exacerbated by her illness. PT/OT consult would be helpful. - carcinoma of left kidney - under hospice services for this, no further treatment  GOC: clear. DNR. Treat her PNA, see if her mobility can be optimized by PT/OT recommendations. D/C planning: ongoing. Resides at home, son lives next door, she does does have a caregiver for part of the day. Care seems to be exceeding what they can currently manage. Hospice SW has been exploring LTC facilities and patient would private pay, but Regina Good has not ultimately agreed to move anywhere else currently. If SNF is recommended for rehab, the family would be interested in pursuing that route.  IDT: hospice team updated Family: discussed with niece present in the room, spoke with son Regina Good several times by phone to provide update.     Transfer summary and med list to shadow chart.  Thank you, Venia Carbon DNP, RN [facility]  ([place] chat, or [facility] under hospice)

## 2021-11-25 NOTE — ED Triage Notes (Signed)
BIB [facility] from home for general weakness, and NV. No IV. VSS per EMS. Pt is a hospice [place] pt. Pt is HOH. Alert, NAD, calm, interactive. Afib on monitor. EDP into room upon arrival, family arrives to [facility] now.

## 2021-11-25 NOTE — ED Provider Notes (Signed)
[facility] Provider Note   CSN: [id number] Arrival date & time: 11/25/21  1025     History Chief Complaint  Patient presents with   Emesis    HPI Regina Good is a 88 y.o. female presenting for poor p.o. intake, fatigue, weakness this morning.  Patient presents with her caregiver.  She is a hospice patient for advanced metastatic cancer.  Over the weekend, she was intermittently confused and had decreased intake.  Caregivers were that she was dehydrated and could not get her to eat this morning.  No known sick contacts.  Patient otherwise tolerating p.o. intake when prompted.  History of metastatic renal cancer, CKD, HLD, diabetes. Sitter states that the patient currently looks better than she did this morning.  Patient presented with EMS.  Patient's recorded medical, surgical, social, medication list and allergies were reviewed in the Snapshot window as part of the initial history.   Review of Systems   Review of Systems  Constitutional:  Negative for chills and fever.  HENT:  Negative for ear pain and sore throat.   Eyes:  Negative for pain and visual disturbance.  Respiratory:  Negative for cough and shortness of breath.   Cardiovascular:  Negative for chest pain and palpitations.  Gastrointestinal:  Negative for abdominal pain and vomiting.  Genitourinary:  Negative for dysuria and hematuria.  Musculoskeletal:  Negative for arthralgias and back pain.  Skin:  Negative for color change and rash.  Neurological:  Negative for seizures and syncope.  Psychiatric/Behavioral:  Positive for confusion.   All other systems reviewed and are negative.   Physical Exam Updated Vital Signs BP (!) 108/93 (BP Location: Left Arm)   Pulse 75   Temp 98.5 F (36.9 C) (Oral)   Resp 19   Wt 48.5 kg   SpO2 96%   BMI 17.81 kg/m  Physical Exam Vitals and nursing note reviewed.  Constitutional:      General: She is not in acute distress.    Appearance: She is  well-developed.  HENT:     Head: Normocephalic and atraumatic.  Eyes:     Conjunctiva/sclera: Conjunctivae normal.  Cardiovascular:     Rate and Rhythm: Normal rate and regular rhythm.     Heart sounds: No murmur heard. Pulmonary:     Effort: Pulmonary effort is normal. No respiratory distress.     Breath sounds: Normal breath sounds.  Abdominal:     Palpations: Abdomen is soft.     Tenderness: There is no abdominal tenderness.  Musculoskeletal:        General: No swelling.     Cervical back: Neck supple.  Skin:    General: Skin is warm and dry.     Capillary Refill: Capillary refill takes less than 2 seconds.  Neurological:     Mental Status: She is alert.  Psychiatric:        Mood and Affect: Mood normal.      ED Course/ Medical Decision Making/ A&P Clinical Course as of 11/25/21 1634  Mon Nov 25, 2021  1239 No evidence of AKI [CC]  1239 HGB 9.1-7.6 no stool changes  [CC]  1239 White count, SOB, likely CAP [CC]  1241 CAP need to ask family  [CC]    Clinical Course User Index [CC] Tretha Sciara, MD    Procedures Procedures   Medications Ordered in ED Medications  azithromycin (ZITHROMAX) 500 mg in sodium chloride 0.9 % 250 mL IVPB (0 mg Intravenous Stopped 11/25/21 1420)  cefTRIAXone (  ROCEPHIN) 1 g in sodium chloride 0.9 % 100 mL IVPB (has no administration in time range)  aspirin EC tablet 81 mg (has no administration in time range)  metoprolol succinate (TOPROL-XL) 24 hr tablet 25 mg (has no administration in time range)  spironolactone (ALDACTONE) tablet 12.5 mg (has no administration in time range)  ALPRAZolam (XANAX) tablet 0.25 mg (has no administration in time range)  pantoprazole (PROTONIX) EC tablet 40 mg (has no administration in time range)  polyethylene glycol (MIRALAX / GLYCOLAX) packet 17 g (has no administration in time range)  sucralfate (CARAFATE) tablet 1 g (has no administration in time range)  tamsulosin (FLOMAX) capsule 0.4 mg (has no  administration in time range)  sodium chloride flush (NS) 0.9 % injection 3 mL (has no administration in time range)  sodium chloride flush (NS) 0.9 % injection 3 mL (has no administration in time range)  sodium chloride flush (NS) 0.9 % injection 3 mL (has no administration in time range)  0.9 %  sodium chloride infusion (has no administration in time range)  acetaminophen (TYLENOL) tablet 650 mg (has no administration in time range)    Or  acetaminophen (TYLENOL) suppository 650 mg (has no administration in time range)  traZODone (DESYREL) tablet 50 mg (has no administration in time range)  polyethylene glycol (MIRALAX / GLYCOLAX) packet 17 g (has no administration in time range)  bisacodyl (DULCOLAX) suppository 10 mg (has no administration in time range)  ondansetron (ZOFRAN) tablet 4 mg (has no administration in time range)    Or  ondansetron (ZOFRAN) injection 4 mg (has no administration in time range)  heparin injection 5,000 Units (has no administration in time range)  insulin aspart (novoLOG) injection 0-6 Units (has no administration in time range)  insulin aspart (novoLOG) injection 0-5 Units (has no administration in time range)  ondansetron (ZOFRAN) injection 4 mg (4 mg Intravenous Given 11/25/21 1127)  lactated ringers bolus 1,000 mL (0 mLs Intravenous Stopped 11/25/21 1312)  cefTRIAXone (ROCEPHIN) 1 g in sodium chloride 0.9 % 100 mL IVPB (0 g Intravenous Stopped 11/25/21 1420)    Medical Decision Making:    Regina Good is a 88 y.o. female who presented to the ED today with AMS detailed above.     Handoff received from EMS.  Additional history discussed with patient's family/caregivers.  Patient's presentation is complicated by their history of multiple comorbid medical problems including renal cancer on hospice.  Patient placed on continuous vitals and telemetry monitoring while in ED which was reviewed periodically.   Complete initial physical exam performed, notably the  patient  was hemodynamically stable in no acute distress.  Currently GCS 15.      Reviewed and confirmed nursing documentation for past medical history, family history, social history.    Initial Assessment:   With the patient's presentation of altered mental status, most likely diagnosis is urinary tract infection, delirium, polypharmacy in the setting of benzodiazepine use in a elderly patient. Other diagnoses were considered including (but not limited to) pneumonia, pulmonary embolism, ACS, metabolic disturbance or endocrinologic disturbance. These are considered less likely due to history of present illness and physical exam findings.   This is most consistent with an acute life/limb threatening illness complicated by underlying chronic conditions.  Initial Plan:  CT head to evaluate for structural intracranial etiology for altered mental status Screening labs including CBC and Metabolic panel to evaluate for infectious or metabolic etiology of disease.  Urinalysis with reflex culture ordered to evaluate for UTI  or relevant urologic/nephrologic pathology.  CXR to evaluate for structural/infectious intrathoracic pathology.  Viral testing including COVID testing to evaluate for infectious etiology of altered mental status Objective evaluation as below reviewed with plan for close reassessment after IV fluids.  Initial Study Results:   Laboratory  All laboratory results reviewed without evidence of clinically relevant pathology.   Exceptions include: Leukocytosis  EKG EKG was reviewed independently. Rate, rhythm, axis, intervals all examined and without medically relevant abnormality. ST segments without concerns for elevations.    Radiology  All images reviewed independently. Agree with radiology report at this time.   DG Chest Portable 1 View  Result Date: 11/25/2021 CLINICAL DATA:  Weakness EXAM: PORTABLE CHEST 1 VIEW COMPARISON:  05/01/2021 FINDINGS: Cardiomegaly. Interstitial and  scattered heterogeneous airspace opacities throughout the lungs with probable small layering pleural effusions. The visualized skeletal structures are unremarkable. IMPRESSION: Cardiomegaly with interstitial and scattered heterogeneous airspace opacities throughout the lungs and probable small layering pleural effusions, findings consistent with edema and or infection. Electronically Signed   By: Delanna Ahmadi M.D.   On: 11/25/2021 11:13   CT HEAD WO CONTRAST (5MM)  Result Date: 11/25/2021 CLINICAL DATA:  Head trauma, minor (Age >= 65y) EXAM: CT HEAD WITHOUT CONTRAST TECHNIQUE: Contiguous axial images were obtained from the base of the skull through the vertex without intravenous contrast. RADIATION DOSE REDUCTION: This exam was performed according to the departmental dose-optimization program which includes automated exposure control, adjustment of the mA and/or kV according to patient size and/or use of iterative reconstruction technique. COMPARISON:  CT head 09/24/2019. FINDINGS: Brain: No evidence of acute infarction, hemorrhage, hydrocephalus, extra-axial collection or mass lesion/mass effect. 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 Vascular: No hyperdense vessel. Calcific intracranial atherosclerosis. Skull: No acute fracture. Sinuses/Orbits: Clear sinuses.  No acute orbital findings. Other: No mastoid effusions. IMPRESSION: No evidence of acute intracranial abnormality. Electronically Signed   By: Margaretha Sheffield M.D.   On: 11/25/2021 11:08     Patient's history of present illness and physical exam findings appear to be most consistent with acute pneumonia.  Given leukocytosis, reactive thrombocytosis.  Discussed with patient's hospice worker who recommended that patient be admitted for ongoing care rather than be placed in outpatient hospice.  Patient would prefer to undergo treatment at this time  rather than be placed on outpatient hospice. Family has requested ongoing care at this time as well.  Patient admitted to the hospital with consultation with hospitalist who agreed with need for admission.  Patient admitted with no further acute events.  Start on ceftriaxone azithromycin for management of community-acquired pneumonia.     Clinical Impression:  1. Community acquired pneumonia, unspecified laterality      Admit   Final Clinical Impression(s) / ED Diagnoses Final diagnoses:  Community acquired pneumonia, unspecified laterality    Rx / DC Orders ED Discharge Orders     None         Tretha Sciara, MD 11/25/21 [phone]

## 2021-11-25 NOTE — ED Notes (Signed)
Pt to CT, alert, NAD, calm, no changes.

## 2021-11-25 NOTE — H&P (Signed)
Patient Demographics:    Regina Good, is a 88 y.o. female  MRN: [id number]   DOB - 10-02-1919  Admit Date - 11/25/2021  Outpatient Primary MD for the patient is Celene Squibb, MD   Assessment & Plan:   Assessment and Plan:  1) sepsis secondary to pneumonia--POA -Patient with tachycardia, tachypnea and leukocytosis in setting of pneumonia and hypoxia -IV Rocephin azithromycin bronchodilators mucolytics and supplemental oxygen as ordered -IV fluids as ordered  2) possible UTI--- IV Rocephin as above  3) acute on chronic anemia-- hemoglobin 7.6..  Down from a baseline usually between 9 and 10 -Consider transfusion if Hgb less than 7  4)CKD stage - 3B  - -creatinine 1.41 which is close to patient's prior baseline -renally adjust medications, avoid nephrotoxic agents / dehydration  / hypotension  5)DM2- Use Novolog/Humalog Sliding scale insulin with Accu-Cheks/Fingersticks as ordered   6)HTN--continue metoprolol and Aldactone  7)metastatic left renal cell carcinoma--- supportive care, under hospice care  8) hyponatremia--sodium is 133 in the setting of vomiting and poor oral intake -Hydrate gently  9) social/ethics--- DNR/DNI, hospice patient, patient's son [name] is next of kin  38) generalized weakness and deconditioning--- get PT eval  Disposition/Need for in-Hospital Stay- patient unable to be discharged at this time due to  -Sepsis secondary to pneumonia and possible UTI requiring IV fluids and IV antibiotics  Status is: Inpatient  Remains inpatient appropriate because:   Dispo: The patient is from: Home              Anticipated d/c is to: Home              Anticipated d/c date is: 2 days              Patient currently is not medically stable to d/c. Barriers: Not Clinically Stable-   With  History of - Reviewed by me  Past Medical History:  Diagnosis Date   Anxiety    CAD (coronary artery disease), native coronary artery    Diabetes mellitus    Family history of diabetes mellitus    GERD (gastroesophageal reflux disease)    History of melanoma    Hyperlipidemia    Hypertension       Past Surgical History:  Procedure Laterality Date   APPENDECTOMY     CORONARY STENT PLACEMENT  2004   NM MYOVIEW LTD  2010   RENAL ARTERY STENT  2006   Chief Complaint  Patient presents with   Emesis     HPI:    Regina Good  is a 88 y.o. female who is a hospice patient with pmhx relevant for CAD, T2DM, GERD, HTN, HLD , metastatic left renal cell carcinoma, CKD3B, and chronic anemia who presented to the ER with nausea vomiting, fatigue generalized weakness and shortness of breath with increasing lethargy and poor oral intake -Chest x-ray suggest pneumonia -WBC 12.7 - hemoglobin 7.6..  Down from a baseline usually between  9 and 10 -Sodium 133, creatinine 1.41 which is close to patient's prior baseline -UA with possible UTI -COVID-negative   Review of systems:    In addition to the HPI above,   A full Review of  Systems was done, all other systems reviewed are negative except as noted above in HPI , .   Social History:  Reviewed by me   Social History   Tobacco Use   Smoking status: Never   Smokeless tobacco: Never  Substance Use Topics   Alcohol use: No     Family History :  Reviewed by me    Family History  Problem Relation Age of Onset   Pancreatic cancer Mother    Coronary artery disease Father    Diabetes Brother    Colon cancer Sister    Stroke Sister    Stroke Sister    Diabetes Son    Stroke Son    Schizophrenia Daughter     Home Medications:   Prior to Admission medications   Medication Sig Start Date End Date Taking? Authorizing Provider  acetaminophen (TYLENOL) 325 MG tablet Take 2 tablets (650 mg total) by mouth every 6 (six) hours as needed  for mild pain (or Fever >/= 101). 09/26/19   Shabrea Weldin, MD  ALPRAZolam (XANAX) 0.25 MG tablet Take 1 tablet (0.25 mg total) by mouth at bedtime as needed for anxiety or sleep. 09/01/21   Roxan Hockey, MD  amoxicillin-clavulanate (AUGMENTIN) 500-125 MG tablet Take 1 tablet (500 mg total) by mouth 2 (two) times daily. 09/01/21   Roxan Hockey, MD  aspirin EC 81 MG tablet Take 1 tablet (81 mg total) by mouth daily with breakfast. 09/01/21 09/01/22  Roxan Hockey, MD  atorvastatin (LIPITOR) 20 MG tablet Take 1 tablet (20 mg total) by mouth daily. 09/01/21   Roxan Hockey, MD  bismuth subsalicylate (PEPTO BISMOL) 262 MG/15ML suspension Take 30 mLs by mouth every 6 (six) hours as needed.    [provider]  furosemide (LASIX) 40 MG tablet Take 0.5 tablets (20 mg total) by mouth every Monday, Wednesday, and Friday. 09/02/21   Roxan Hockey, MD  magnesium citrate SOLN As directed. 06/20/21   Evalee Jefferson, PA-C  metFORMIN (GLUCOPHAGE) 500 MG tablet Take 1 tablet (500 mg total) by mouth daily with breakfast. 09/01/21   Roxan Hockey, MD  metoprolol succinate (TOPROL-XL) 25 MG 24 hr tablet Take 1 tablet (25 mg total) by mouth daily. 09/01/21   Roxan Hockey, MD  omeprazole (PRILOSEC) 20 MG capsule Take 1 capsule (20 mg total) by mouth daily. 09/01/21   Roxan Hockey, MD  polyethylene glycol (MIRALAX / GLYCOLAX) 17 g packet Take 17 g by mouth daily. 09/01/21   Roxan Hockey, MD  spironolactone (ALDACTONE) 25 MG tablet Take 0.5 tablets (12.5 mg total) by mouth daily. 09/01/21   Roxan Hockey, MD  sucralfate (CARAFATE) 1 g tablet Take 1 g by mouth 3 (three) times daily. 02/14/21   [provider]  tamsulosin (FLOMAX) 0.4 MG CAPS capsule Take 1 capsule (0.4 mg total) by mouth daily after supper. 09/01/21   Roxan Hockey, MD     Allergies:     Allergies  Allergen Reactions   Codeine Other (See Comments)    Sensation of being out of this world.   Sulfonamide  Derivatives Other (See Comments)    Makes her feel like dreaming while awake.     Physical Exam:   Vitals  Blood pressure (!) 108/93, pulse 75, temperature 98.5 F (36.9 C),  temperature source Oral, resp. rate 19, weight 48.5 kg, SpO2 96 %.  Physical Examination: General appearance - alert,  in no distress  Mental status - alert, oriented to person, place, and time,  Nose- [place] 2L/min Eyes - sclera anicteric Neck - supple, no JVD elevation , Chest -diminished breath sounds with scattered rhonchi Heart - S1 and S2 normal, regular  Abdomen - soft, nontender, nondistended, +BS Neurological - screening mental status exam normal, neck supple without rigidity, cranial nerves II through XII intact, DTR's normal and symmetric Extremities - no pedal edema noted, intact peripheral pulses  Skin - warm, dry    Data Review:    CBC Recent Labs  Lab 11/25/21 1043  WBC 12.7*  HGB 7.6*  HCT 24.7*  PLT 643*  MCV 75.8*  MCH 23.3*  MCHC 30.8  RDW 14.9  LYMPHSABS 0.8  MONOABS 0.6  EOSABS 0.0  BASOSABS 0.0   ------------------------------------------------------------------------------------------------------------------  Chemistries  Recent Labs  Lab 11/25/21 1043  NA 133*  K 5.0  CL 99  CO2 24  GLUCOSE 178*  BUN 33*  CREATININE 1.41*  CALCIUM 8.3*   ------------------------------------------------------------------------------------------------------------------ estimated creatinine clearance is 16.6 mL/min (A) (by C-G formula based on SCr of 1.41 mg/dL (H)). ------------------------------------------------------------------------------------------------------------------ ------------------------------------------------------------------------------------------------------------------    Component Value Date/Time   BNP 1,088.0 (H) 05/02/2021 0443   Urinalysis    Component Value Date/Time   COLORURINE RED (A) 11/25/2021 1044   APPEARANCEUR CLOUDY (A) 11/25/2021 1044    APPEARANCEUR Cloudy (A) 09/04/2021 1322   LABSPEC 1.021 11/25/2021 1044   PHURINE 7.0 11/25/2021 1044   GLUCOSEU NEGATIVE 11/25/2021 1044   HGBUR LARGE (A) 11/25/2021 1044   HGBUR trace-intact 07/27/2008 1336   BILIRUBINUR NEGATIVE 11/25/2021 1044   BILIRUBINUR Negative 09/04/2021 1322   KETONESUR NEGATIVE 11/25/2021 1044   PROTEINUR 100 (A) 11/25/2021 1044   UROBILINOGEN 0.2 01/19/2014 0936   NITRITE NEGATIVE 11/25/2021 1044   LEUKOCYTESUR TRACE (A) 11/25/2021 1044    ----------------------------------------------------------------------------------------------------------------   Imaging Results:    DG Chest Portable 1 View  Result Date: 11/25/2021 CLINICAL DATA:  Weakness EXAM: PORTABLE CHEST 1 VIEW COMPARISON:  05/01/2021 FINDINGS: Cardiomegaly. Interstitial and scattered heterogeneous airspace opacities throughout the lungs with probable small layering pleural effusions. The visualized skeletal structures are unremarkable. IMPRESSION: Cardiomegaly with interstitial and scattered heterogeneous airspace opacities throughout the lungs and probable small layering pleural effusions, findings consistent with edema and or infection. Electronically Signed   By: Delanna Ahmadi M.D.   On: 11/25/2021 11:13   CT HEAD WO CONTRAST (5MM)  Result Date: 11/25/2021 CLINICAL DATA:  Head trauma, minor (Age >= 65y) EXAM: CT HEAD WITHOUT CONTRAST TECHNIQUE: Contiguous axial images were obtained from the base of the skull through the vertex without intravenous contrast. RADIATION DOSE REDUCTION: This exam was performed according to the departmental dose-optimization program which includes automated exposure control, adjustment of the mA and/or kV according to patient size and/or use of iterative reconstruction technique. COMPARISON:  CT head 09/24/2019. FINDINGS: Brain: No evidence of acute infarction, hemorrhage, hydrocephalus, extra-axial collection or mass lesion/mass effect. 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 Vascular: No hyperdense vessel. Calcific intracranial atherosclerosis. Skull: No acute fracture. Sinuses/Orbits: Clear sinuses.  No acute orbital findings. Other: No mastoid effusions. IMPRESSION: No evidence of acute intracranial abnormality. Electronically Signed   By: Margaretha Sheffield M.D.   On: 11/25/2021 11:08    Radiological Exams on Admission: DG Chest Portable 1 View  Result Date: 11/25/2021 CLINICAL DATA:  Weakness EXAM: PORTABLE CHEST 1 VIEW COMPARISON:  05/01/2021 FINDINGS: Cardiomegaly. Interstitial and scattered heterogeneous airspace opacities throughout the lungs with probable small layering pleural effusions. The visualized skeletal structures are unremarkable. IMPRESSION: Cardiomegaly with interstitial and scattered heterogeneous airspace opacities throughout the lungs and probable small layering pleural effusions, findings consistent with edema and or infection. Electronically Signed   By: Delanna Ahmadi M.D.   On: 11/25/2021 11:13   CT HEAD WO CONTRAST (5MM)  Result Date: 11/25/2021 CLINICAL DATA:  Head trauma, minor (Age >= 65y) EXAM: CT HEAD WITHOUT CONTRAST TECHNIQUE: Contiguous axial images were obtained from the base of the skull through the vertex without intravenous contrast. RADIATION DOSE REDUCTION: This exam was performed according to the departmental dose-optimization program which includes automated exposure control, adjustment of the mA and/or kV according to patient size and/or use of iterative reconstruction technique. COMPARISON:  CT head 09/24/2019. FINDINGS: Brain: No evidence of acute infarction, hemorrhage, hydrocephalus, extra-axial collection or mass lesion/mass effect. 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 Vascular: No hyperdense vessel. Calcific  intracranial atherosclerosis. Skull: No acute fracture. Sinuses/Orbits: Clear sinuses.  No acute orbital findings. Other: No mastoid effusions. IMPRESSION: No evidence of acute intracranial abnormality. Electronically Signed   By: Margaretha Sheffield M.D.   On: 11/25/2021 11:08    DVT Prophylaxis -SCD  AM Labs Ordered, also please review Full Orders  Family Communication: Admission, patients condition and plan of care including tests being ordered have been discussed with the patient and hospice RN who indicate understanding and agree with the plan   Condition   -stable   Roxan Hockey M.D on 11/25/2021 at 7:10 PM Go to [email or web address] -  for contact info  [facility] - Office  [phone]

## 2021-11-25 NOTE — Progress Notes (Signed)
Manufacturing engineer [facility])    Regina Good is a current hospice patient with a terminal diagnosis of carcinoma of the left kidney. She resides at home, her son Regina Good is her [facility]. There was concerns of increase lethargy over the weekend and worsening pain.   Her son Regina Good, is her Media planner.  With hospice, she has an out of facility DNR.  The family has recently expressed the desire to have her moved to a facility. [facility] has received her FL2 and are reviewing her for possibly offer a bed. Currently, no bed has been offered.   [facility] will continue to follow while she is in the hospital.  Thank you, Venia Carbon DNP, RN [facility] Liaison  ([place] chat)

## 2021-11-26 DIAGNOSIS — J189 Pneumonia, unspecified organism: Secondary | ICD-10-CM | POA: Diagnosis not present

## 2021-11-26 DIAGNOSIS — A419 Sepsis, unspecified organism: Secondary | ICD-10-CM | POA: Diagnosis not present

## 2021-11-26 LAB — COMPREHENSIVE METABOLIC PANEL
ALT: 42 U/L (ref 0–44)
AST: 53 U/L — ABNORMAL HIGH (ref 15–41)
Albumin: 2.4 g/dL — ABNORMAL LOW (ref 3.5–5.0)
Alkaline Phosphatase: 81 U/L (ref 38–126)
Anion gap: 7 (ref 5–15)
BUN: 32 mg/dL — ABNORMAL HIGH (ref 8–23)
CO2: 23 mmol/L (ref 22–32)
Calcium: 7.8 mg/dL — ABNORMAL LOW (ref 8.9–10.3)
Chloride: 103 mmol/L (ref 98–111)
Creatinine, Ser: 1.35 mg/dL — ABNORMAL HIGH (ref 0.44–1.00)
GFR, Estimated: 35 mL/min — ABNORMAL LOW (ref >60)
Glucose, Bld: 124 mg/dL — ABNORMAL HIGH (ref 70–99)
Potassium: 5.1 mmol/L (ref 3.5–5.1)
Sodium: 133 mmol/L — ABNORMAL LOW (ref 135–145)
Total Bilirubin: 0.5 mg/dL (ref 0.3–1.2)
Total Protein: 5.9 g/dL — ABNORMAL LOW (ref 6.5–8.1)

## 2021-11-26 LAB — GLUCOSE, CAPILLARY
Glucose-Capillary: 129 mg/dL — ABNORMAL HIGH (ref 70–99)
Glucose-Capillary: 179 mg/dL — ABNORMAL HIGH (ref 70–99)
Glucose-Capillary: 314 mg/dL — ABNORMAL HIGH (ref 70–99)
Glucose-Capillary: 219 mg/dL — ABNORMAL HIGH (ref 70–99)
Glucose-Capillary: 178 mg/dL — ABNORMAL HIGH (ref 70–99)

## 2021-11-26 LAB — CBC
HCT: 23.5 % — ABNORMAL LOW (ref 36.0–46.0)
Hemoglobin: 7.2 g/dL — ABNORMAL LOW (ref 12.0–15.0)
MCH: 23.7 pg — ABNORMAL LOW (ref 26.0–34.0)
MCHC: 30.6 g/dL (ref 30.0–36.0)
MCV: 77.3 fL — ABNORMAL LOW (ref 80.0–100.0)
Platelets: 537 10*3/uL — ABNORMAL HIGH (ref 150–400)
RBC: 3.04 MIL/uL — ABNORMAL LOW (ref 3.87–5.11)
RDW: 15.3 % (ref 11.5–15.5)
WBC: 12.8 10*3/uL — ABNORMAL HIGH (ref 4.0–10.5)
nRBC: 0 % (ref 0.0–0.2)

## 2021-11-26 MED ORDER — POLYETHYLENE GLYCOL 3350 17 G PO PACK
17.0000 g | PACK | Freq: Two times a day (BID) | ORAL | Status: DC
  Administered 2021-11-26 – 2021-11-29 (×7): 17 g via ORAL
  Filled 2021-11-26 (×7): qty 1

## 2021-11-26 MED ORDER — SODIUM CHLORIDE 0.9 % IV SOLN: INTRAVENOUS | Status: AC

## 2021-11-26 MED ORDER — MAGNESIUM HYDROXIDE 400 MG/5ML PO SUSP
30.0000 mL | Freq: Once | ORAL | Status: AC
  Administered 2021-11-26: 30 mL via ORAL
  Filled 2021-11-26: qty 30

## 2021-11-26 MED ORDER — PROCHLORPERAZINE 25 MG RE SUPP
25.0000 mg | Freq: Once | RECTAL | Status: AC
  Administered 2021-11-26: 25 mg via RECTAL
  Filled 2021-11-26: qty 1

## 2021-11-26 NOTE — Progress Notes (Signed)
[facility]) Hospital Liaison Note    Mrs. Grennan is a current hospice patient with a terminal diagnosis of carcinoma of the left kidney. Patient has been relatively independent and active at home. Over the weekend, the patient was noted to have increased weakness, as well as being lethargic. After speaking with [facility] RN, Mrs. Merriwether requested to go to the hospital for evaluation, son made the decision to call EMS and send her to the Emergency Department. She was admitted with a diagnosis of PNA. Per Dr. Konrad Dolores, [facility] MD, this is a related hospital admission.    Visited Mrs. Bogden at bedside. No family present at bedside during visit. Patient was alert and conversant, lying in bed, IV fluids running at 165m/hr. No complaints noted. Patient remains on IV antibiotics for PNA. Reached out to son, KLanny Hurstvia telephone to provide update. Was unable to leave a voicemail due to mailbox being full.     Patient is inpatient appropriate due to requiring treatment of PNA with IV antibiotics.    V/S:  120/64 bp, 97.8 Temp, 80 bpm, 16 RR, 96% on RA  I&O: 2,269/300  Labs:  Sodium: 133 (L) Glucose: 124 (H) BUN: 32 (H) Creatinine: 1.35 (H) Calcium: 7.8 (L) Anion gap: 7 Albumin: 2.4 (L) AST: 53 (H) Total Protein: 5.9 (L) GFR, Estimated: 35 (L) WBC: 12.8 (H) RBC: 3.04 (L) Hemoglobin: 7.2 (L) HCT: 23.5 (L) MCV: 77.3 (L) MCH: 23.7 (L) Platelets: 537 (H)  Diagnostics: None today.   IV/PRN:  0.9 % sodium chloride infusion - 1086mhr, Continuous, IV, x3 azithromycin (ZITHROMAX) 500 mg in sodium chloride 0.9 % 250 mL IVPB - '500mg'$ , q24hrs, IV x1 cefTRIAXone (ROCEPHIN) 1 g in sodium chloride 0.9 % 100 mL IVPB -1g, q24hrs, IV, x1 acetaminophen (TYLENOL) tablet 650 mg - '650mg'$ , q5hrs, PRN, PO x1 traZODone (DESYREL) tablet 50 mg - at bedtime, PRN, PO x1 ondansetron (ZOFRAN) injection 4 mg -q6hrs, PRN, IV, x1   Problem list: Assessment and Plan:   1) sepsis secondary to  pneumonia--POA -Patient with tachycardia, tachypnea and leukocytosis in setting of pneumonia and hypoxia -IV Rocephin azithromycin bronchodilators mucolytics and supplemental oxygen as ordered -IV fluids as ordered   2) possible UTI--- IV Rocephin as above   3) acute on chronic anemia-- hemoglobin 7.6..  Down from a baseline usually between 9 and 10 -Consider transfusion if Hgb less than 7   4)CKD stage - 3B  - -creatinine 1.41 which is close to patient's prior baseline -renally adjust medications, avoid nephrotoxic agents / dehydration  / hypotension   5)DM2- Use Novolog/Humalog Sliding scale insulin with Accu-Cheks/Fingersticks as ordered    6)HTN--continue metoprolol and Aldactone   7)metastatic left renal cell carcinoma--- supportive care, under hospice care   8) hyponatremia--sodium is 133 in the setting of vomiting and poor oral intake -Hydrate gently   9) social/ethics--- DNR/DNI, hospice patient, patient's son KeLanny Hursts next of kin   10) generalized weakness and deconditioning--- get PT eval  GOC: Patient is currently a DNR.    D/C planning: Ongoing.  Family: Reached out to son, KeLanny Hurstia telephone to provide update. Was unable to leave a voicemail due to mailbox being full.     IDT: Updated.   Medication list and Transfer Summary placed on Shadow Chart.   Should patient need ambulance transfer - [facility].        SaZigmund GottronRN [facility] Hospital[facility]ison  [phone] 

## 2021-11-26 NOTE — Progress Notes (Addendum)
PROGRESS NOTE     Regina Good, is a 88 y.o. female, DOB - April 04, 1919, RXY:[id number]  Admit date - 11/25/2021   Admitting Physician Abdoul Encinas Denton Brick, MD  Outpatient Primary MD for the patient is Celene Squibb, MD  LOS - 1  Chief Complaint  Patient presents with   Emesis        Brief Narrative:   88 y.o. female who is a hospice patient with pmhx relevant for CAD, T2DM, GERD, HTN, HLD , metastatic left renal cell carcinoma, CKD-3B, and chronic anemia admitted on 11/26/2021 with Sepsis  secondary to pneumonia concerns about possible UTI -Prior to admission patient was under hospice care at home    -Assessment and Plan: 1)Sepsis secondary to Pneumonia--POA -Patient met sepsis criteria on admission  -Continue -IV Rocephin and  azithromycin pending further culture data,  C/n bronchodilators,  mucolytics  -Leukocytosis persist -Hypoxia has resolved -c/n IV fluids as ordered   2)Possible UTI--- IV Rocephin as above pending urine culture   3)Acute on chronic Anemia-- hemoglobin 7.2.  Down from a baseline usually between 9 and 10 -Anticipate that Hgb will drop further with hydration/hemodilution -Scant rectal bleeding when suppository was inserted on 11/26/21--??  Hemorrhoids -Consider Anusol HC -Consider transfusion if Hgb less than 7   4)CKD stage - 3B  - -creatinine  is close to patient's prior baseline -renally adjust medications, avoid nephrotoxic agents / dehydration  / hypotension   5)DM2- Use Novolog/Humalog Sliding scale insulin with Accu-Cheks/Fingersticks as ordered    6)HTN--continue metoprolol , stop Aldactone   7)Metastatic Left Renal cell Carcinoma--- supportive care, under hospice care prior to admission   8)Hyponatremia--sodium is 133 in the setting of vomiting and poor oral intake -Hydrate gently   9)Social/ethics--- DNR/DNI, hospice patient, patient's son [name] is next of kin   10)Generalized weakness and deconditioning--- get PT eval when patient is more  medically stable  Disposition/Need for in-Hospital Stay- patient unable to be discharged at this time due to  -Sepsis secondary to pneumonia and possible UTI requiring IV fluids and IV antibiotics   Status is: Inpatient   Remains inpatient appropriate because:    Dispo: The patient is from: Home              Anticipated d/c is to: Home              Anticipated d/c date is: 2 days              Patient currently is not medically stable to d/c. Barriers: Not Clinically Stable-   Code Status : -  Code Status: DNR   Family Communication:    NA (patient is alert, awake and coherent)   DVT Prophylaxis  :   - SCDs   heparin injection 5,000 Units Start: 11/25/21 1800 SCDs Start: 11/25/21 1441 Place TED hose Start: 11/25/21 1441   Lab Results  Component Value Date   PLT 537 (H) 11/26/2021    Inpatient Medications  Scheduled Meds:  aspirin EC  81 mg Oral Q breakfast   heparin  5,000 Units Subcutaneous Q8H   insulin aspart  0-5 Units Subcutaneous QHS   insulin aspart  0-6 Units Subcutaneous TID WC   magnesium hydroxide  30 mL Oral Once   metoprolol succinate  25 mg Oral Daily   pantoprazole  40 mg Oral Daily   polyethylene glycol  17 g Oral BID   prochlorperazine  25 mg Rectal Once   sodium chloride flush  3 mL Intravenous Q12H  sodium chloride flush  3 mL Intravenous Q12H   spironolactone  12.5 mg Oral Daily   sucralfate  1 g Oral TID WC   tamsulosin  0.4 mg Oral QPC supper   Continuous Infusions:  sodium chloride     sodium chloride 100 mL/hr at 11/26/21 5093   azithromycin 500 mg (11/26/21 1207)   cefTRIAXone (ROCEPHIN)  IV 1 g (11/26/21 0939)   PRN Meds:.sodium chloride, acetaminophen **OR** acetaminophen, ALPRAZolam, bisacodyl, ondansetron **OR** ondansetron (ZOFRAN) IV, sodium chloride flush, traZODone   Anti-infectives (From admission, onward)    Start     Dose/Rate Route Frequency Ordered Stop   11/26/21 1000  cefTRIAXone (ROCEPHIN) 1 g in sodium chloride 0.9 %  100 mL IVPB        1 g 200 mL/hr over 30 Minutes Intravenous Every 24 hours 11/25/21 1443     11/25/21 1245  cefTRIAXone (ROCEPHIN) 1 g in sodium chloride 0.9 % 100 mL IVPB        1 g 200 mL/hr over 30 Minutes Intravenous  Once 11/25/21 1241 11/25/21 1420   11/25/21 1245  azithromycin (ZITHROMAX) 500 mg in sodium chloride 0.9 % 250 mL IVPB        500 mg 250 mL/hr over 60 Minutes Intravenous Every 24 hours 11/25/21 1241         Subjective: Regina Good today has no fevers,    No chest pain, --Scant rectal bleeding when suppository was inserted on 11/26/21--??  Hemorrhoids -Patient had nausea, subsequently emesis and then patient became unresponsive suspect vasovagal reaction-- -patient was placed in Trendelenburg position, received IV fluids, EKG reassuring vital signs reassuring -Patient is Now awake alert and conversational  Objective: Vitals:   11/25/21 2122 11/26/21 0424 11/26/21 1256 11/26/21 1303  BP: 116/62 120/64 112/78 115/62  Pulse: 77 80 (!) 103 94  Resp: 16     Temp: (!) 97.1 F (36.2 C) 97.8 F (36.6 C)    TempSrc:  Oral    SpO2: 96% 96% 96% 92%  Weight:        Intake/Output Summary (Last 24 hours) at 11/26/2021 1414 Last data filed at 11/26/2021 1000 Gross per 24 hour  Intake 1269.3 ml  Output 300 ml  Net 969.3 ml   Filed Weights   11/25/21 1048  Weight: 48.5 kg    Physical Exam  Gen:- Awake Alert, patient looks chronically ill HEENT:- [place].AT, No sclera icterus Neck-Supple Neck,No JVD,.  Lungs-diminished breath sounds, no wheezing  CV- S1, S2 normal, regular  Abd-  +ve B.Sounds, Abd Soft, No tenderness, no CVA area tenderness    Extremity/Skin:- No  edema, pedal pulses present  Psych-affect is flat, oriented x3 Neuro-generalized weakness, no new focal deficits, no tremors  Data Reviewed: I have personally reviewed following labs and imaging studies  CBC: Recent Labs  Lab 11/25/21 1043 11/26/21 0438  WBC 12.7* 12.8*  NEUTROABS 11.2*  --   HGB  7.6* 7.2*  HCT 24.7* 23.5*  MCV 75.8* 77.3*  PLT 643* 267*   Basic Metabolic Panel: Recent Labs  Lab 11/25/21 1043 11/26/21 0438  NA 133* 133*  K 5.0 5.1  CL 99 103  CO2 24 23  GLUCOSE 178* 124*  BUN 33* 32*  CREATININE 1.41* 1.35*  CALCIUM 8.3* 7.8*   GFR: Estimated Creatinine Clearance: 17.4 mL/min (A) (by C-G formula based on SCr of 1.35 mg/dL (H)). Liver Function Tests: Recent Labs  Lab 11/26/21 0438  AST 53*  ALT 42  ALKPHOS 81  BILITOT 0.5  PROT 5.9*  ALBUMIN 2.4*    Recent Results (from the past 240 hour(s))  SARS Coronavirus 2 by RT PCR (hospital order, performed in [facility] hospital lab) *cepheid single result test* Anterior Nasal Swab     Status: None   Collection Time: 11/25/21 10:45 AM   Specimen: Anterior Nasal Swab  Result Value Ref Range Status   SARS Coronavirus 2 by RT PCR NEGATIVE NEGATIVE Final    Comment: (NOTE) SARS-CoV-2 target nucleic acids are NOT DETECTED.  The SARS-CoV-2 RNA is generally detectable in upper and lower respiratory specimens during the acute phase of infection. The lowest concentration of SARS-CoV-2 viral copies this assay can detect is 250 copies / mL. A negative result does not preclude SARS-CoV-2 infection and should not be used as the sole basis for treatment or other patient management decisions.  A negative result may occur with improper specimen collection / handling, submission of specimen other than nasopharyngeal swab, presence of viral mutation(s) within the areas targeted by this assay, and inadequate number of viral copies (<250 copies / mL). A negative result must be combined with clinical observations, patient history, and epidemiological information.  Fact Sheet for Patients:   https://www.patel.info/  Fact Sheet for Healthcare Providers: https://hall.com/  This test is not yet approved or  cleared by the [place] FDA and has been authorized for  detection and/or diagnosis of SARS-CoV-2 by FDA under an Emergency Use Authorization (EUA).  This EUA will remain in effect (meaning this test can be used) for the duration of the COVID-19 declaration under Section 564(b)(1) of the Act, 21 U.S.C. section 360bbb-3(b)(1), unless the authorization is terminated or revoked sooner.  Performed at [facility], [address] [place], [place] [address]   Blood culture (routine x 2)     Status: None (Preliminary result)   Collection Time: 11/25/21  1:13 PM   Specimen: Left Antecubital; Blood  Result Value Ref Range Status   Specimen Description LEFT ANTECUBITAL  Final   Special Requests   Final    BOTTLES DRAWN AEROBIC AND ANAEROBIC Blood Culture adequate volume   Culture   Final    NO GROWTH < 24 HOURS Performed at [facility], [address] [place], [place] [address]    Report Status PENDING  Incomplete  Blood culture (routine x 2)     Status: None (Preliminary result)   Collection Time: 11/25/21  1:30 PM   Specimen: BLOOD RIGHT ARM  Result Value Ref Range Status   Specimen Description BLOOD RIGHT ARM  Final   Special Requests   Final    BOTTLES DRAWN AEROBIC AND ANAEROBIC Blood Culture adequate volume   Culture   Final    NO GROWTH < 24 HOURS Performed at [facility], [address], [place], [place] [address]    Report Status PENDING  Incomplete    Radiology Studies: DG Chest Portable 1 View  Result Date: 11/25/2021 CLINICAL DATA:  Weakness EXAM: PORTABLE CHEST 1 VIEW COMPARISON:  05/01/2021 FINDINGS: Cardiomegaly. Interstitial and scattered heterogeneous airspace opacities throughout the lungs with probable small layering pleural effusions. The visualized skeletal structures are unremarkable. IMPRESSION: Cardiomegaly with interstitial and scattered heterogeneous airspace opacities throughout the lungs and probable small layering pleural effusions, findings consistent with edema and or infection. Electronically Signed   By: Delanna Ahmadi M.D.   On: 11/25/2021 11:13   CT HEAD WO CONTRAST (5MM)  Result Date: 11/25/2021 CLINICAL DATA:  Head trauma, minor (Age >= 65y) EXAM: CT HEAD WITHOUT  CONTRAST TECHNIQUE: Contiguous axial images were obtained from the base of the skull through the vertex without intravenous contrast. RADIATION DOSE REDUCTION: This exam was performed according to the departmental dose-optimization program which includes automated exposure control, adjustment of the mA and/or kV according to patient size and/or use of iterative reconstruction technique. COMPARISON:  CT head 09/24/2019. FINDINGS: Brain: No evidence of acute infarction, hemorrhage, hydrocephalus, extra-axial collection or mass lesion/mass effect. 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 Vascular: No hyperdense vessel. Calcific intracranial atherosclerosis. Skull: No acute fracture. Sinuses/Orbits: Clear sinuses.  No acute orbital findings. Other: No mastoid effusions. IMPRESSION: No evidence of acute intracranial abnormality. Electronically Signed   By: Margaretha Sheffield M.D.   On: 11/25/2021 11:08    Scheduled Meds:  aspirin EC  81 mg Oral Q breakfast   heparin  5,000 Units Subcutaneous Q8H   insulin aspart  0-5 Units Subcutaneous QHS   insulin aspart  0-6 Units Subcutaneous TID WC   magnesium hydroxide  30 mL Oral Once   metoprolol succinate  25 mg Oral Daily   pantoprazole  40 mg Oral Daily   polyethylene glycol  17 g Oral BID   prochlorperazine  25 mg Rectal Once   sodium chloride flush  3 mL Intravenous Q12H   sodium chloride flush  3 mL Intravenous Q12H   spironolactone  12.5 mg Oral Daily   sucralfate  1 g Oral TID WC   tamsulosin  0.4 mg Oral QPC supper   Continuous Infusions:  sodium chloride     sodium chloride 100 mL/hr at 11/26/21 8115   azithromycin 500 mg (11/26/21 1207)   cefTRIAXone (ROCEPHIN)  IV 1 g (11/26/21 0939)      LOS: 1 day   Roxan Hockey M.D on 11/26/2021 at 2:14 PM  Go to [email or web address] - for contact info  [facility] - Office  [phone]  If 7PM-7AM, please contact night-coverage [email or web address] 11/26/2021, 2:14 PM

## 2021-11-26 NOTE — TOC Progression Note (Signed)
  Transition of Care (TOC) Screening Note   Patient Details  Name: Regina Good Date of Birth: Aug 01, 1919   Transition of Care [facility]) CM/SW Contact:    Boneta Lucks, RN Phone Number: 11/26/2021, 7:05 PM  Patient is active with hospice. Inpatient admission for treatment of pneumonia. Patient lives alone and independent at baseline. Hospice will follow and wants PT/OT to evaluate when stable.   Transition of Care Department [facility]) has reviewed patient and no TOC needs have been identified at this time. We will continue to monitor patient advancement through interdisciplinary progression rounds. If new patient transition needs arise, please place a TOC consult.        Expected Discharge Plan and Services         Living arrangements for the past 2 months: [place]

## 2021-11-26 NOTE — Progress Notes (Signed)
Patient assisted to bAthroom , given suppostory when patient got back in bed she became unresponsive possible vagal her VSS . Dr. Joesph Fillers at bedside after rapid called new IV started in LFA. NS 500cc bolus started and infusing at this time. Patient verbal c/o nausea PRN zofran given

## 2021-11-26 NOTE — Plan of Care (Signed)

## 2021-11-27 DIAGNOSIS — J189 Pneumonia, unspecified organism: Secondary | ICD-10-CM | POA: Diagnosis not present

## 2021-11-27 DIAGNOSIS — A419 Sepsis, unspecified organism: Secondary | ICD-10-CM | POA: Diagnosis not present

## 2021-11-27 LAB — URINE CULTURE: Culture: <10000 — AB

## 2021-11-27 LAB — GLUCOSE, CAPILLARY
Glucose-Capillary: 147 mg/dL — ABNORMAL HIGH (ref 70–99)
Glucose-Capillary: 220 mg/dL — ABNORMAL HIGH (ref 70–99)
Glucose-Capillary: 253 mg/dL — ABNORMAL HIGH (ref 70–99)
Glucose-Capillary: 197 mg/dL — ABNORMAL HIGH (ref 70–99)

## 2021-11-27 LAB — RENAL FUNCTION PANEL
Albumin: 2.3 g/dL — ABNORMAL LOW (ref 3.5–5.0)
Anion gap: 6 (ref 5–15)
BUN: 31 mg/dL — ABNORMAL HIGH (ref 8–23)
CO2: 22 mmol/L (ref 22–32)
Calcium: 7.4 mg/dL — ABNORMAL LOW (ref 8.9–10.3)
Chloride: 105 mmol/L (ref 98–111)
Creatinine, Ser: 1.17 mg/dL — ABNORMAL HIGH (ref 0.44–1.00)
GFR, Estimated: 42 mL/min — ABNORMAL LOW (ref >60)
Glucose, Bld: 152 mg/dL — ABNORMAL HIGH (ref 70–99)
Phosphorus: 3.5 mg/dL (ref 2.5–4.6)
Potassium: 4.6 mmol/L (ref 3.5–5.1)
Sodium: 133 mmol/L — ABNORMAL LOW (ref 135–145)

## 2021-11-27 LAB — CBC
HCT: 24.9 % — ABNORMAL LOW (ref 36.0–46.0)
Hemoglobin: 7.5 g/dL — ABNORMAL LOW (ref 12.0–15.0)
MCH: 23.4 pg — ABNORMAL LOW (ref 26.0–34.0)
MCHC: 30.1 g/dL (ref 30.0–36.0)
MCV: 77.8 fL — ABNORMAL LOW (ref 80.0–100.0)
Platelets: 645 10*3/uL — ABNORMAL HIGH (ref 150–400)
RBC: 3.2 MIL/uL — ABNORMAL LOW (ref 3.87–5.11)
RDW: 15.5 % (ref 11.5–15.5)
WBC: 16.9 10*3/uL — ABNORMAL HIGH (ref 4.0–10.5)
nRBC: 0 % (ref 0.0–0.2)

## 2021-11-27 MED ORDER — CALCIUM GLUCONATE-NACL 1-0.675 GM/50ML-% IV SOLN
1.0000 g | Freq: Once | INTRAVENOUS | Status: AC
  Administered 2021-11-27: 1000 mg via INTRAVENOUS
  Filled 2021-11-27: qty 50

## 2021-11-27 MED ORDER — ALUM & MAG HYDROXIDE-SIMETH 200-200-20 MG/5ML PO SUSP
30.0000 mL | Freq: Four times a day (QID) | ORAL | Status: DC | PRN
  Administered 2021-11-27: 30 mL via ORAL
  Filled 2021-11-27: qty 30

## 2021-11-27 MED ORDER — ENSURE ENLIVE PO LIQD
237.0000 mL | Freq: Two times a day (BID) | ORAL | Status: DC
  Administered 2021-11-27 – 2021-11-29 (×4): 237 mL via ORAL

## 2021-11-27 NOTE — TOC Initial Note (Addendum)
Transition of Care [facility]) - Initial/Assessment Note    Patient Details  Name: Regina Good MRN: [id number] Date of Birth: 1919/12/03  Transition of Care [facility]) CM/SW Contact:    Salome Arnt, [place] Phone Number: 11/27/2021, 2:21 PM  Clinical Narrative: Pt admitted with sepsis secondary to pneumonia. Assessment completed due to high risk readmission score. Pt reports she lives with her daughter and has assistance as needed from her or from an aid 4 hours a day. Her son lives next door. Pt is active with [facility]. She reports she is unsure about d/c plan at this time. Pt agreeable to have son, [name] involved in d/c planning. Per [name], family is not sure they have adequate support at home for pt to return. However, pt has not been completely agreeable to placement. She is agreeable to PT evaluation and will discuss further after recommendations. MD notified for PT evaluation. TOC will continue to follow.                     Barriers to Discharge: Continued Medical Work up   Patient Goals and CMS Choice Patient states their goals for this hospitalization and ongoing recovery are:: awaiting PT eval for recommendations   Choice offered to / list presented to : Patient, Adult Children  Expected Discharge Plan and Services   In-house Referral: Clinical Social Work     Living arrangements for the past 2 months: Single Family Home                                      Prior Living Arrangements/Services Living arrangements for the past 2 months: Single Family Home Lives with:: Adult Children Patient language and need for interpreter reviewed:: Yes        Need for Family Participation in Patient Care: Yes (Comment) Care giver support system in place?: Yes (comment) Current home services: DME, Hospice (walker, cane, wheelchair) Criminal Activity/Legal Involvement Pertinent to Current Situation/Hospitalization: No - Comment as needed  Activities of Daily  Living Home Assistive Devices/Equipment: Walker (specify type), Hearing aid ADL Screening (condition at time of admission) Patient's cognitive ability adequate to safely complete daily activities?: Yes Is the patient deaf or have difficulty hearing?: Yes Does the patient have difficulty seeing, even when wearing glasses/contacts?: No Does the patient have difficulty concentrating, remembering, or making decisions?: Yes Patient able to express need for assistance with ADLs?: Yes Independently performs ADLs?: No Communication: Independent Dressing (OT): Needs assistance Is this a change from baseline?: Change from baseline, expected to last >3 days Grooming: Needs assistance Is this a change from baseline?: Change from baseline, expected to last >3 days Feeding: Independent Bathing: Needs assistance Is this a change from baseline?: Change from baseline, expected to last >3 days Toileting: Needs assistance Is this a change from baseline?: Change from baseline, expected to last >3days In/Out Bed: Needs assistance Is this a change from baseline?: Change from baseline, expected to last >3 days Walks in Home: Needs assistance Is this a change from baseline?: Change from baseline, expected to last >3 days Does the patient have difficulty walking or climbing stairs?: Yes Weakness of Legs: Both Weakness of Arms/Hands: None  Permission Sought/Granted                  Emotional Assessment     Affect (typically observed): Appropriate Orientation: : Oriented to Self, Oriented to Place, Oriented to  Time, Oriented to Situation Alcohol / Substance Use: Not Applicable Psych Involvement: No (comment)  Admission diagnosis:  Pneumonia [J18.9] Patient Active Problem List   Diagnosis Date Noted   Pneumonia 11/25/2021   Hospice care patient 11/25/2021   Sepsis due to pneumonia ([place]) 11/25/2021   Cancer of left kidney ([place]) 09/07/2021   Diverticulitis 08/29/2021   Chest pain 05/01/2021    Cardiomyopathy ([place]) 09/25/2019   Nausea 09/24/2019   Dizziness 09/24/2019   ACUTE CYSTITIS 07/27/2008   ALLERGIC RHINITIS 05/22/2008   CHRONIC KIDNEY DISEASE STAGE III (MODERATE) 02/18/2008   Type 2 diabetes mellitus with stage 3 chronic kidney disease ([place]) 02/04/2008   HLD (hyperlipidemia) 02/04/2008   Anxiety state 02/04/2008   Essential hypertension 02/04/2008   MYOCARDIAL INFARCTION, HX OF 02/04/2008   CAD (coronary artery disease), native coronary artery 02/04/2008   GERD 02/04/2008   CAROTID BRUIT, RIGHT 02/04/2008   MELANOMA, HX OF 02/04/2008   PCP:  Celene Squibb, MD Pharmacy:   [facility] [place] - [address] AT [address] [place] [address] Phone: [phone] Fax: [phone]     Social Determinants of Health (SDOH) Interventions    Readmission Risk Interventions    11/27/2021    2:10 PM  Readmission Risk Prevention Plan  Transportation Screening Complete  HRI or [place] Complete  Social Work Consult for [place] Planning/Counseling Complete  Medication Review [employer]) Complete

## 2021-11-27 NOTE — Progress Notes (Signed)
Patient assisted to BSC , moderate amount of blood noted in BSC bucket. Notified Dr. Roger Shelter , appears she may have hemmorhoid present, will continue to monitor for changes.

## 2021-11-27 NOTE — Progress Notes (Signed)
[facility]) Hospital Liaison Note    Regina Good is a current hospice patient with a terminal diagnosis of carcinoma of the left kidney. Patient has been relatively independent and active at home. Over the weekend, the patient was noted to have increased weakness, as well as being lethargic. After speaking with [facility] RN, Regina Good requested to go to the hospital for evaluation, son made the decision to call EMS and send her to the Emergency Department. She was admitted with a diagnosis of PNA. Per Dr. Konrad Dolores, [facility] MD, this is a related hospital admission.    Visited Regina Good at bedside. No family present at bedside during visit. Patient was alert and conversant, lying in bed, eating her lunch. Report exchanged with hospital staff.  Regina Good became unresponsive after going to the bathroom yesterday afternoon, possibly vasovagal syncope, was fine after that. No complaints noted today. Patient remains on IV antibiotics for PNA. The plan is for Regina Good to have a PT consult and then decide about where she will be discharged to per their recommendations. Family feels they do not have adequate means to take care of her if she returns home, but patient is still reluctant to seek placement at a facility. Spoke to patient's son Regina Good, provided an update, and answered any questions.   Patient is inpatient appropriate due to requiring treatment of PNA with IV antibiotics.    V/S:  120/70 bp, 97.5 Temp, 95 bpm, 18 RR, 99% on RA  I&O: 2,227.8/250  Labs:  Sodium: 133 (L) Glucose: 152 (H) BUN: 31 (H) Creatinine: 1.17 (H) Calcium: 7.4 (L) Albumin: 2.3 (L) GFR, Estimated: 42 (L) WBC: 16.9 (H) RBC: 3.20 (L) Hemoglobin: 7.5 (L) HCT: 24.9 (L) MCV: 77.8 (L) MCH: 23.4 (L) Platelets: 645 (H)  Diagnostics: None today.   IV/PRN:  0.9 % sodium chloride infusion - 133m/hr, Continuous, IV, x1 azithromycin (ZITHROMAX) 500 mg in sodium chloride 0.9 % 250 mL IVPB - 5025m q24hrs, IV  x1 cefTRIAXone (ROCEPHIN) 1 g in sodium chloride 0.9 % 100 mL IVPB -1g, q24hrs, IV, x1 acetaminophen (TYLENOL) tablet 650 mg - 65033mq5hrs, PRN, PO x1 traZODone (DESYREL) tablet 50 mg - at bedtime, PRN, PO x1 ondansetron (ZOFRAN) injection 4 mg -q6hrs, PRN, IV, x2 calcium gluconate 1 g/ 50 mL sodium chloride IVPB - 0nce, IV x1 bisacodyl (DULCOLAX) suppository 10 mg - daily, PRN, x1 ALPRAZolam (XANAX) tablet 0.25 mg- at bedtime, PRN, PO, x1   Problem list: -Assessment and Plan: 1)Sepsis secondary to Pneumonia--POA -Patient met sepsis criteria on admission  -Continue -IV Rocephin and  azithromycin pending further culture data,  C/n bronchodilators,  mucolytics  -Leukocytosis persist -Hypoxia has resolved -c/n IV fluids as ordered   2)Possible UTI--- IV Rocephin as above pending urine culture   3)Acute on chronic Anemia-- hemoglobin 7.2.  Down from a baseline usually between 9 and 10 -Anticipate that Hgb will drop further with hydration/hemodilution -Scant rectal bleeding when suppository was inserted on 11/26/21--??  Hemorrhoids -Consider Anusol HC -Consider transfusion if Hgb less than 7   4)CKD stage - 3B  - -creatinine  is close to patient's prior baseline -renally adjust medications, avoid nephrotoxic agents / dehydration  / hypotension   5)DM2- Use Novolog/Humalog Sliding scale insulin with Accu-Checks/Fingersticks as ordered    6)HTN--continue metoprolol , stop Aldactone   7)Metastatic Left Renal cell Carcinoma--- supportive care, under hospice care prior to admission   8)Hyponatremia--sodium is 133 in the setting of vomiting and poor oral intake -Hydrate  gently   9)Social/ethics--- DNR/DNI, hospice patient, patient's son Regina Good is next of kin   10)Generalized weakness and deconditioning--- get PT eval when patient is more medically stable  GOC: Patient is currently a DNR.    D/C planning: Ongoing.  Family: Spoke to patient's son Regina Good, provided an update, and  answered any questions.   IDT: Updated.   Medication list and Transfer Summary placed on Shadow Chart.   Should patient need ambulance transfer - Please use [facility]) as they contract this service for our active hospice patients.        Zigmund Gottron, RN [facility] Liaison  [phone]

## 2021-11-27 NOTE — Progress Notes (Signed)
PROGRESS NOTE     Regina Good, is a 88 y.o. female, DOB - Apr 09, 1919, SFK:[id number]  Admit date - 11/25/2021   Admitting Physician Courage Denton Brick, MD  Outpatient Primary MD for the patient is Celene Squibb, MD  LOS - 2  Chief Complaint  Patient presents with   Emesis       Subjective: Regina Good -was seen and examined this morning, comfortable in bed Daughter-in-law present at bedside.  Nursing staff reporting nursing staff reported some blood per rectum with BM this a.m. Mentation back to baseline   Had a scant rectal bleed when suppository was inserted on 11/26/2021, possible hemorrhoids had nausea subsequently had emesis became unresponsive vasovagal reaction.... That episode was not repeated today   -Patient is currently hemodynamically stable, awake alert following commands  Brief Narrative:  DESA RECH is a  88 y.o. female who is a hospice patient with pmhx relevant for CAD, T2DM, GERD, HTN, HLD , metastatic left renal cell carcinoma, CKD-3B, and chronic anemia admitted on 11/26/2021 with Sepsis  secondary to pneumonia concerns about possible UTI -Prior to admission patient was under hospice care at home    Assessment and Plan:   Sepsis secondary to Pneumonia--POA -Patient met sepsis criteria on admission -resolved -Continue -IV Rocephin and  azithromycin pending further culture data,  C/n bronchodilators,  mucolytics  -Persistent leukocytosis -Hypoxia has resolved -c/n IV fluids as ordered   Possible UTI--- IV Rocephin as above pending urine culture -no growth to date  Acute on chronic Anemia-- hemoglobin 7.2. >>7.5    Down from a baseline usually between 9 and 10 -Anticipate that Hgb will drop further with hydration/hemodilution -Scant rectal bleeding when suppository was inserted on 11/26/21--??  Hemorrhoids -We will start Anusol HC -Considering transfusing with 1 unit of PRBC if further symptomatic   CKD stage - 3B  - -creatinine  is close to patient's  prior baseline -renally adjust medications, avoid nephrotoxic agents / dehydration  / hypotension   DM2- Use Novolog/Humalog Sliding scale insulin with Accu-Cheks/Fingersticks as ordered    HTN--continue metoprolol , stop Aldactone   Metastatic Left Renal cell Carcinoma--- supportive care, under hospice care prior to admission   Hyponatremia--sodium is 133 in the setting of vomiting and poor oral intake -Hydrate gently   Social/ethics--- DNR/DNI, hospice patient, patient's son Regina Good is next of kin   Generalized weakness and deconditioning--- get PT eval when patient is more medically stable  ==================================================================================   Disposition/Need for in-Hospital Stay- patient unable to be discharged at this time due to  -Sepsis secondary to pneumonia and possible UTI requiring IV fluids and IV antibiotics   Status is: Inpatient   Remains inpatient appropriate because:    Dispo: The patient is from: Home              Anticipated d/c is to: SNF               Anticipated d/c date is: 1  days              Patient currently is not medically stable to d/c. Barriers: Not Clinically Stable yet   Code Status : -  Code Status: DNR   Family Communication:    NA (patient is alert, awake and coherent)   DVT Prophylaxis  :   - SCDs   heparin injection 5,000 Units Start: 11/25/21 1800 SCDs Start: 11/25/21 1441 Place TED hose Start: 11/25/21 1441   Lab Results  Component Value Date   PLT 645 (  H) 11/27/2021    Inpatient Medications  Scheduled Meds:  aspirin EC  81 mg Oral Q breakfast   feeding supplement  237 mL Oral BID BM   heparin  5,000 Units Subcutaneous Q8H   insulin aspart  0-5 Units Subcutaneous QHS   insulin aspart  0-6 Units Subcutaneous TID WC   metoprolol succinate  25 mg Oral Daily   pantoprazole  40 mg Oral Daily   polyethylene glycol  17 g Oral BID   sodium chloride flush  3 mL Intravenous Q12H   sodium chloride  flush  3 mL Intravenous Q12H   sucralfate  1 g Oral TID WC   tamsulosin  0.4 mg Oral QPC supper   Continuous Infusions:  sodium chloride     azithromycin 500 mg (11/27/21 1242)   cefTRIAXone (ROCEPHIN)  IV 1 g (11/27/21 1045)   PRN Meds:.sodium chloride, acetaminophen **OR** acetaminophen, ALPRAZolam, bisacodyl, ondansetron **OR** ondansetron (ZOFRAN) IV, sodium chloride flush, traZODone   Anti-infectives (From admission, onward)    Start     Dose/Rate Route Frequency Ordered Stop   11/26/21 1000  cefTRIAXone (ROCEPHIN) 1 g in sodium chloride 0.9 % 100 mL IVPB        1 g 200 mL/hr over 30 Minutes Intravenous Every 24 hours 11/25/21 1443     11/25/21 1245  cefTRIAXone (ROCEPHIN) 1 g in sodium chloride 0.9 % 100 mL IVPB        1 g 200 mL/hr over 30 Minutes Intravenous  Once 11/25/21 1241 11/25/21 1420   11/25/21 1245  azithromycin (ZITHROMAX) 500 mg in sodium chloride 0.9 % 250 mL IVPB        500 mg 250 mL/hr over 60 Minutes Intravenous Every 24 hours 11/25/21 1241          Objective: Vitals:   11/26/21 1303 11/26/21 1417 11/26/21 2155 11/27/21 0459  BP: 115/62 121/66 130/68 120/70  Pulse: 94 76 88 95  Resp:  17 16 18   Temp:  (!) 97.2 F (36.2 C) (!) 97 F (36.1 C) (!) 97.5 F (36.4 C)  TempSrc:  Oral  Oral  SpO2: 92% 95% 96% 99%  Weight:        Intake/Output Summary (Last 24 hours) at 11/27/2021 1313 Last data filed at 11/27/2021 0729 Gross per 24 hour  Intake 1987.83 ml  Output 300 ml  Net 1687.83 ml   Filed Weights   11/25/21 1048  Weight: 48.5 kg      Physical Exam:   General:  AAO x 3,  cooperative, no distress;   HEENT:  Normocephalic, PERRL, otherwise with in Normal limits   Neuro:  CNII-XII intact. , normal motor and sensation, reflexes intact   Lungs:   Clear to auscultation BL, Respirations unlabored,  No wheezes / crackles  Cardio:    S1/S2, RRR, No murmure, No Rubs or Gallops   Abdomen:  Soft, non-tender, bowel sounds active all four  quadrants, no guarding or peritoneal signs.  Muscular  skeletal:  Limited exam - sever global generalized weaknesses - in bed, able to move all 4 extremities,   2+ pulses,  symmetric, No pitting edema  Skin:  Dry, warm to touch, negative for any Rashes,  Wounds: Please see nursing documentation          Data Reviewed: I have personally reviewed following labs and imaging studies  CBC: Recent Labs  Lab 11/25/21 1043 11/26/21 0438 11/27/21 0445  WBC 12.7* 12.8* 16.9*  NEUTROABS 11.2*  --   --  HGB 7.6* 7.2* 7.5*  HCT 24.7* 23.5* 24.9*  MCV 75.8* 77.3* 77.8*  PLT 643* 537* 389*   Basic Metabolic Panel: Recent Labs  Lab 11/25/21 1043 11/26/21 0438 11/27/21 0445  NA 133* 133* 133*  K 5.0 5.1 4.6  CL 99 103 105  CO2 24 23 22   GLUCOSE 178* 124* 152*  BUN 33* 32* 31*  CREATININE 1.41* 1.35* 1.17*  CALCIUM 8.3* 7.8* 7.4*  PHOS  --   --  3.5   GFR: Estimated Creatinine Clearance: 20.1 mL/min (A) (by C-G formula based on SCr of 1.17 mg/dL (H)). Liver Function Tests: Recent Labs  Lab 11/26/21 0438 11/27/21 0445  AST 53*  --   ALT 42  --   ALKPHOS 81  --   BILITOT 0.5  --   PROT 5.9*  --   ALBUMIN 2.4* 2.3*    Recent Results (from the past 240 hour(s))  SARS Coronavirus 2 by RT PCR (hospital order, performed in [facility] hospital lab) *cepheid single result test* Anterior Nasal Swab     Status: None   Collection Time: 11/25/21 10:45 AM   Specimen: Anterior Nasal Swab  Result Value Ref Range Status   SARS Coronavirus 2 by RT PCR NEGATIVE NEGATIVE Final    Comment: (NOTE) SARS-CoV-2 target nucleic acids are NOT DETECTED.  The SARS-CoV-2 RNA is generally detectable in upper and lower respiratory specimens during the acute phase of infection. The lowest concentration of SARS-CoV-2 viral copies this assay can detect is 250 copies / mL. A negative result does not preclude SARS-CoV-2 infection and should not be used as the sole basis for treatment or  other patient management decisions.  A negative result may occur with improper specimen collection / handling, submission of specimen other than nasopharyngeal swab, presence of viral mutation(s) within the areas targeted by this assay, and inadequate number of viral copies (<250 copies / mL). A negative result must be combined with clinical observations, patient history, and epidemiological information.  Fact Sheet for Patients:   https://www.patel.info/  Fact Sheet for Healthcare Providers: https://hall.com/  This test is not yet approved or  cleared by the [place] FDA and has been authorized for detection and/or diagnosis of SARS-CoV-2 by FDA under an Emergency Use Authorization (EUA).  This EUA will remain in effect (meaning this test can be used) for the duration of the COVID-19 declaration under Section 564(b)(1) of the Act, 21 U.S.C. section 360bbb-3(b)(1), unless the authorization is terminated or revoked sooner.  Performed at [facility], [address], [place], [place] [address]   Blood culture (routine x 2)     Status: None (Preliminary result)   Collection Time: 11/25/21  1:13 PM   Specimen: Left Antecubital; Blood  Result Value Ref Range Status   Specimen Description LEFT ANTECUBITAL  Final   Special Requests   Final    BOTTLES DRAWN AEROBIC AND ANAEROBIC Blood Culture adequate volume   Culture   Final    NO GROWTH 2 DAYS Performed at [facility], [address] [place], [place] [address]    Report Status PENDING  Incomplete  Blood culture (routine x 2)     Status: None (Preliminary result)   Collection Time: 11/25/21  1:30 PM   Specimen: BLOOD RIGHT ARM  Result Value Ref Range Status   Specimen Description BLOOD RIGHT ARM  Final   Special Requests   Final    BOTTLES DRAWN AEROBIC AND ANAEROBIC Blood Culture adequate volume   Culture  Final    NO GROWTH 2 DAYS Performed at [facility], [address],  [place], [place] [address]    Report Status PENDING  Incomplete    Radiology Studies: No results found.  Scheduled Meds:  aspirin EC  81 mg Oral Q breakfast   feeding supplement  237 mL Oral BID BM   heparin  5,000 Units Subcutaneous Q8H   insulin aspart  0-5 Units Subcutaneous QHS   insulin aspart  0-6 Units Subcutaneous TID WC   metoprolol succinate  25 mg Oral Daily   pantoprazole  40 mg Oral Daily   polyethylene glycol  17 g Oral BID   sodium chloride flush  3 mL Intravenous Q12H   sodium chloride flush  3 mL Intravenous Q12H   sucralfate  1 g Oral TID WC   tamsulosin  0.4 mg Oral QPC supper   Continuous Infusions:  sodium chloride     azithromycin 500 mg (11/27/21 1242)   cefTRIAXone (ROCEPHIN)  IV 1 g (11/27/21 1045)     LOS: 2 days   Deatra James M.D on 11/27/2021 at 1:13 PM  Go to [email or web address] - for contact info  [facility] - Office  [phone]  If 7PM-7AM, please contact night-coverage [email or web address] 11/27/2021, 1:13 PM

## 2021-11-28 ENCOUNTER — Inpatient Hospital Stay (HOSPITAL_COMMUNITY)

## 2021-11-28 DIAGNOSIS — A419 Sepsis, unspecified organism: Secondary | ICD-10-CM | POA: Diagnosis not present

## 2021-11-28 DIAGNOSIS — J189 Pneumonia, unspecified organism: Secondary | ICD-10-CM | POA: Diagnosis not present

## 2021-11-28 LAB — CBC
HCT: 23.9 % — ABNORMAL LOW (ref 36.0–46.0)
Hemoglobin: 7.4 g/dL — ABNORMAL LOW (ref 12.0–15.0)
MCH: 23.1 pg — ABNORMAL LOW (ref 26.0–34.0)
MCHC: 31 g/dL (ref 30.0–36.0)
MCV: 74.7 fL — ABNORMAL LOW (ref 80.0–100.0)
Platelets: 664 10*3/uL — ABNORMAL HIGH (ref 150–400)
RBC: 3.2 MIL/uL — ABNORMAL LOW (ref 3.87–5.11)
RDW: 15.3 % (ref 11.5–15.5)
WBC: 22.7 10*3/uL — ABNORMAL HIGH (ref 4.0–10.5)
nRBC: 0 % (ref 0.0–0.2)

## 2021-11-28 LAB — BASIC METABOLIC PANEL
Anion gap: 8 (ref 5–15)
BUN: 34 mg/dL — ABNORMAL HIGH (ref 8–23)
CO2: 19 mmol/L — ABNORMAL LOW (ref 22–32)
Calcium: 7.8 mg/dL — ABNORMAL LOW (ref 8.9–10.3)
Chloride: 104 mmol/L (ref 98–111)
Creatinine, Ser: 1.14 mg/dL — ABNORMAL HIGH (ref 0.44–1.00)
GFR, Estimated: 43 mL/min — ABNORMAL LOW (ref >60)
Glucose, Bld: 167 mg/dL — ABNORMAL HIGH (ref 70–99)
Potassium: 4.6 mmol/L (ref 3.5–5.1)
Sodium: 131 mmol/L — ABNORMAL LOW (ref 135–145)

## 2021-11-28 LAB — PROCALCITONIN: Procalcitonin: 0.51 ng/mL

## 2021-11-28 LAB — GLUCOSE, CAPILLARY
Glucose-Capillary: 172 mg/dL — ABNORMAL HIGH (ref 70–99)
Glucose-Capillary: 221 mg/dL — ABNORMAL HIGH (ref 70–99)
Glucose-Capillary: 210 mg/dL — ABNORMAL HIGH (ref 70–99)
Glucose-Capillary: 181 mg/dL — ABNORMAL HIGH (ref 70–99)

## 2021-11-28 LAB — LACTIC ACID, PLASMA: Lactic Acid, Venous: 1.8 mmol/L (ref 0.5–1.9)

## 2021-11-28 MED ORDER — LEVOFLOXACIN IN D5W 500 MG/100ML IV SOLN: 500.0000 mg | INTRAVENOUS | Status: DC

## 2021-11-28 MED ORDER — LEVOFLOXACIN 500 MG PO TABS
750.0000 mg | ORAL_TABLET | ORAL | Status: DC
  Administered 2021-11-28: 750 mg via ORAL
  Filled 2021-11-28: qty 2

## 2021-11-28 MED ORDER — ORAL CARE MOUTH RINSE: 15.0000 mL | OROMUCOSAL | Status: DC | PRN

## 2021-11-28 MED ORDER — DEXTROSE-NACL 5-0.45 % IV SOLN: INTRAVENOUS | Status: DC

## 2021-11-28 MED ORDER — LEVOFLOXACIN IN D5W 750 MG/150ML IV SOLN: 750.0000 mg | INTRAVENOUS | Status: DC

## 2021-11-28 MED ORDER — HYDROCORTISONE ACETATE 25 MG RE SUPP
25.0000 mg | Freq: Two times a day (BID) | RECTAL | Status: DC
  Administered 2021-11-28 – 2021-11-29 (×2): 25 mg via RECTAL
  Filled 2021-11-28 (×2): qty 1

## 2021-11-28 MED ORDER — RISAQUAD PO CAPS
1.0000 | ORAL_CAPSULE | Freq: Three times a day (TID) | ORAL | Status: DC
  Administered 2021-11-29 (×2): 1 via ORAL
  Filled 2021-11-28 (×2): qty 1

## 2021-11-28 MED ORDER — MAGNESIUM CITRATE PO SOLN: 1.0000 | Freq: Once | ORAL | Status: DC

## 2021-11-28 MED ORDER — SODIUM CHLORIDE 0.9 % IV SOLN
12.5000 mg | Freq: Three times a day (TID) | INTRAVENOUS | Status: DC | PRN
  Administered 2021-11-28: 12.5 mg via INTRAVENOUS
  Filled 2021-11-28: qty 0.5

## 2021-11-28 NOTE — Progress Notes (Signed)
Patient requested to decline a few meds and take nausea med IV. Spoke with eyes closed, states she would like to rest. Call bell within reach. Patient has comfort pillow behind head, and is comfortable at this time.

## 2021-11-28 NOTE — Progress Notes (Signed)
[facility]) Hospital Liaison Note    Mrs. Wik is a current hospice patient with a terminal diagnosis of carcinoma of the left kidney. Patient has been relatively independent and active at home. Over the weekend, the patient was noted to have increased weakness, as well as being lethargic. After speaking with [facility] RN, Mrs. Lalanne requested to go to the hospital for evaluation, son made the decision to call EMS and send her to the Emergency Department. She was admitted with a diagnosis of PNA. Per Dr. Konrad Dolores, [facility] MD, this is a related hospital admission.    Visited Mrs. Hue at bedside. No family present at bedside during visit. Patient was lying in bed, resting with her eyes closed. When I spoke to her, she opened her eyes and smiled at me and went back to sleep. Patient was on 02 at 2L/min for comfort.  Report exchanged with hospital staff.  Mrs. Ackman has had some nausea and vomiting this morning, as well as recurrent syncope episodes with ambulation. Patient became sort of breath after the syncope episode and 02 was applied and chest x-ray ordered. Dr. reports that patient is not appropriate for rehab and suggests she be discharged to [facility]. Patient currently remains on fluids and IV antibiotics for PNA. Spoke to patient's son [name], about goals of care. [name] would be ok with discontinuing antibiotics, focusing on keeping Mrs. Loeffelholz comfortable and discharge her to the hospice home if appropriate. [name] said that it was a lot to hear today and did not wish for her transfer today. Will follow up tomorrow.   Patient is inpatient appropriate due to requiring treatment of PNA with IV antibiotics.    V/S:  125/67 bp, 97.9 Temp, 108 bpm, 14 RR, 99% on 2L/min of 02 via [place]  I&O: 960/325  Labs:  Sodium: 131 (L) CO2: 19 (L) Glucose: 167 (H) BUN: 34 (H) Creatinine: 1.14 (H) Calcium: 7.8 (L) GFR, Estimated: 43 (L) WBC: 22.7 (H) RBC: 3.20 (L) Hemoglobin: 7.4 (L) HCT: 23.9  (L) MCV: 74.7 (L) MCH: 23.1 (L) Platelets: 664 (H)  Diagnostics:  PORTABLE CHEST 1 VIEW IMPRESSION: No significant change in small bilateral pleural effusions and bibasilar scattered linear and heterogeneous airspace opacities. This again may represent edema and/or infection.   IV/PRN:  dextrose 5 %-0.45 % sodium chloride infusion - 1g, once, IV x1 promethazine (PHENERGAN) 12.5 mg in sodium chloride 0.9 % 50 mL IVPB - 12.$RemoveB'5mg'[id number]$ , q8hrs, PRN, IV x1 acetaminophen (TYLENOL) tablet 650 mg - q6hrs, PRN, PO x1 ondansetron (ZOFRAN) injection 4 mg - q6hrs, PRN, IV x1    Problem list: Assessment and Plan:   Sepsis secondary to Pneumonia--POA -Patient met sepsis criteria on admission -resolved -Continue -IV Rocephin and  azithromycin pending further culture data,  C/n bronchodilators,  mucolytics  -Persistent leukocytosis>>> 22.7 today, procalcitonin 0.51, lactic acid 1.8 Chest x-ray:  no significant changes, small bilateral pleural effusion mild changes consistent with edema versus infection -Switching IV antibiotics Rocephin and azithromycin to IV Levaquin Hemodynamically otherwise stable.   -Hypoxia has resolved -satting 99% on 2 L -c/n IV fluids as ordered   Possible UTI--- IV Rocephin as above pending urine culture -no growth to date    Acute on chronic Anemia-- hemoglobin 7.2. >>7.5 >>> 7.4 Offered 1 versus 2 units of PRBC to be transfused patient and her son confirmed their wishes of not pursuing with blood transfusion Hemoglobin- Down from a baseline usually between 9 and 10   Scant rectal bleeding when suppository  was inserted on 11/26/21--??  Hemorrhoids -We will start Anusol HC -Offered blood transfusion patient and her son has refused   CKD stage - 3B  - -creatinine  is close to patient's prior baseline -renally adjust medications, avoid nephrotoxic agents / dehydration  / hypotension   DM2- Use Novolog/Humalog Sliding scale insulin with Accu-Cheks/Fingersticks as ordered     HTN--continue metoprolol , stop Aldactone   Metastatic Left Renal cell Carcinoma--- supportive care, under hospice care prior to admission   Hyponatremia--sodium is 133 in the setting of vomiting and poor oral intake -Hydrate gently  GOC: Patient is currently a DNR.    D/C planning: Plan is to discharge to the Hospice home tomorrow.  Family: Spoke to patient's son, [name], via telephone. Provided an update and talked about goals of care.    IDT: Updated.   Medication list and Transfer Summary placed on Shadow Chart.   Should patient need ambulance transfer - Please use [facility]) as they contract this service for our active hospice patients.        Zigmund Gottron, RN [facility] Liaison  [phone]

## 2021-11-28 NOTE — TOC Progression Note (Signed)
Transition of Care [facility]) - Progression Note    Patient Details  Name: Regina Good MRN: [id number] Date of Birth: 19-Jul-1919  Transition of [facility]) CM/SW [place], [place] Phone Number: 11/28/2021, 12:49 PM  Clinical Narrative:    CSW updated by MD that family no longer planning for SNF as pt is not rehabable and would like residential hospice placement. CSW spoke to [employer] who states they will speak with pts son about wishes. TOC to follow.    Barriers to Discharge: Continued Medical Work up  Expected Discharge Plan and Services   In-house Referral: Clinical Social Work     Living arrangements for the past 2 months: Single Family Home                                       Social Determinants of Health (SDOH) Interventions    Readmission Risk Interventions    11/27/2021    2:10 PM  Readmission Risk Prevention Plan  Transportation Screening Complete  HRI or [place] Complete  Social Work Consult for [place] Planning/Counseling Complete  Medication Review [employer]) Complete

## 2021-11-28 NOTE — Progress Notes (Addendum)
PROGRESS NOTE     Regina Good, is a 88 y.o. female, DOB - 07-19-1919, SLP:[id number]  Admit date - 11/25/2021   Admitting Physician Courage Denton Brick, MD  Outpatient Primary MD for the patient is Celene Squibb, MD  LOS - 3  Chief Complaint  Patient presents with   Emesis       Subjective: Regina Good -was seen and examined this morning, was complaining of constipation  Per nursing staff with ambulation had another syncopal episode this morning Quickly regained consciousness.. When wiping and cleaning patient nursing reported blood-tinged stool mixed with urine  -Patient is currently hemodynamically stable, awake alert following commands   Caregiver neighbor then followed by patient's son Regina Good who was present at bedside Updated.  Brief Narrative:  Regina Good is a  88 y.o. female who is a hospice patient with pmhx relevant for CAD, T2DM, GERD, HTN, HLD , metastatic left renal cell carcinoma, CKD-3B, and chronic anemia admitted on 11/26/2021 with Sepsis  secondary to pneumonia concerns about possible UTI -Prior to admission patient was under hospice care at home    Assessment and Plan:   Sepsis secondary to Pneumonia--POA -Patient met sepsis criteria on admission -resolved -Continue -IV Rocephin and  azithromycin pending further culture data,  C/n bronchodilators,  mucolytics  -Persistent leukocytosis>>> 22.7 today, procalcitonin 0.51, lactic acid 1.8 Chest x-ray:  no significant changes, small bilateral pleural effusion mild changes consistent with edema versus infection -Switching IV antibiotics Rocephin and azithromycin to IV Levaquin Hemodynamically otherwise stable.  -Hypoxia has resolved -satting 99% on 2 L -c/n IV fluids as ordered   Possible UTI--- IV Rocephin as above pending urine culture -no growth to date   Acute on chronic Anemia-- hemoglobin 7.2. >>7.5 >>> 7.4 Offered 1 versus 2 units of PRBC to be transfused patient and her son confirmed their wishes of  not pursuing with blood transfusion Hemoglobin- Down from a baseline usually between 9 and 10  Scant rectal bleeding when suppository was inserted on 11/26/21--??  Hemorrhoids -We will start Anusol HC -Offered blood transfusion patient and her son has refused   CKD stage - 3B  - -creatinine  is close to patient's prior baseline -renally adjust medications, avoid nephrotoxic agents / dehydration  / hypotension   DM2- Use Novolog/Humalog Sliding scale insulin with Accu-Cheks/Fingersticks as ordered    HTN--continue metoprolol , stop Aldactone   Metastatic Left Renal cell Carcinoma--- supportive care, under hospice care prior to admission   Hyponatremia--sodium is 133 in the setting of vomiting and poor oral intake -Hydrate gently   Social/ethics- -- DNR/DNI, hospice patient, patient's son Regina Good is next of kin Discussed with Regina Good at bedside in regards to plan of care, disposition Patient clearly is not rehab verbal, with a few steps she syncopized this... Continues to deteriorate mentally and physically...Marland KitchenMarland Kitchen Prognosis remain poor, life expectancy less than 2 weeks.  Regina Good is agreed for hospice to be resumed TOC has been notified  Anticipating discharge to hospice in 1-2 days     Generalized weakness and deconditioning--- get PT eval when patient is more medically stable With very few steps patient syncopized this  ==================================================================================   Disposition/Need for in-Hospital Stay- patient unable to be discharged at this time due to  -Sepsis secondary to pneumonia and possible UTI requiring IV fluids and IV antibiotics   Status is: Inpatient   Remains inpatient appropriate because:    Dispo: The patient is from: Home  Anticipated d/c is to: SNF               Anticipated d/c date is: 1  days              Patient currently is not medically stable to d/c. Barriers: Not Clinically Stable yet   Code Status :  -  Code Status: DNR   Family Communication:    NA (patient is alert, awake and coherent)   DVT Prophylaxis  :   - SCDs   heparin injection 5,000 Units Start: 11/25/21 1800 SCDs Start: 11/25/21 1441 Place TED hose Start: 11/25/21 1441   Lab Results  Component Value Date   PLT 664 (H) 11/28/2021    Inpatient Medications  Scheduled Meds:  acidophilus  1 capsule Oral TID WC   aspirin EC  81 mg Oral Q breakfast   feeding supplement  237 mL Oral BID BM   heparin  5,000 Units Subcutaneous Q8H   hydrocortisone  25 mg Rectal BID   insulin aspart  0-5 Units Subcutaneous QHS   insulin aspart  0-6 Units Subcutaneous TID WC   magnesium citrate  1 Bottle Oral Once   metoprolol succinate  25 mg Oral Daily   pantoprazole  40 mg Oral Daily   polyethylene glycol  17 g Oral BID   sodium chloride flush  3 mL Intravenous Q12H   sodium chloride flush  3 mL Intravenous Q12H   sucralfate  1 g Oral TID WC   tamsulosin  0.4 mg Oral QPC supper   Continuous Infusions:  sodium chloride     dextrose 5 % and 0.45% NaCl     [START ON 11/30/2021] levofloxacin (LEVAQUIN) IV     promethazine (PHENERGAN) injection (IM or IVPB)     PRN Meds:.sodium chloride, acetaminophen **OR** acetaminophen, ALPRAZolam, alum & mag hydroxide-simeth, bisacodyl, ondansetron **OR** ondansetron (ZOFRAN) IV, promethazine (PHENERGAN) injection (IM or IVPB), sodium chloride flush, traZODone   Anti-infectives (From admission, onward)    Start     Dose/Rate Route Frequency Ordered Stop   11/30/21 0830  levofloxacin (LEVAQUIN) IVPB 750 mg        750 mg 100 mL/hr over 90 Minutes Intravenous Every 48 hours 11/28/21 1045     11/28/21 1130  levofloxacin (LEVAQUIN) IVPB 500 mg  Status:  Discontinued        500 mg 100 mL/hr over 60 Minutes Intravenous Every 24 hours 11/28/21 1044 11/28/21 1045   11/28/21 0815  levofloxacin (LEVAQUIN) tablet 750 mg  Status:  Discontinued        750 mg Oral Every 48 hours 11/28/21 0803 11/28/21 1045    11/26/21 1000  cefTRIAXone (ROCEPHIN) 1 g in sodium chloride 0.9 % 100 mL IVPB  Status:  Discontinued        1 g 200 mL/hr over 30 Minutes Intravenous Every 24 hours 11/25/21 1443 11/28/21 0803   11/25/21 1245  cefTRIAXone (ROCEPHIN) 1 g in sodium chloride 0.9 % 100 mL IVPB        1 g 200 mL/hr over 30 Minutes Intravenous  Once 11/25/21 1241 11/25/21 1420   11/25/21 1245  azithromycin (ZITHROMAX) 500 mg in sodium chloride 0.9 % 250 mL IVPB  Status:  Discontinued        500 mg 250 mL/hr over 60 Minutes Intravenous Every 24 hours 11/25/21 1241 11/28/21 0803        Objective: Vitals:   11/27/21 1442 11/27/21 2106 11/28/21 0945 11/28/21 0949  BP: 119/68 (!) 118/52 Marland Kitchen)  91/54 125/67  Pulse: 90 91 (!) 120 (!) 108  Resp: 18 20 16 14   Temp: 97.6 F (36.4 C) 97.9 F (36.6 C) 97.6 F (36.4 C) 97.9 F (36.6 C)  TempSrc: Oral Oral Oral Axillary  SpO2: 96% 97% 90% 99%  Weight:        Intake/Output Summary (Last 24 hours) at 11/28/2021 1103 Last data filed at 11/28/2021 0500 Gross per 24 hour  Intake 480 ml  Output 275 ml  Net 205 ml   Filed Weights   11/25/21 1048  Weight: 48.5 kg       Physical Exam:   General:  AAO x 3,  cooperative, no distress; cachectic, chronically ill female  HEENT:  Normocephalic, PERRL, otherwise with in Normal limits   Neuro:  CNII-XII intact. , normal motor and sensation, reflexes intact   Lungs:   Clear to auscultation BL, Respirations unlabored,  No wheezes / crackles  Cardio:    S1/S2, RRR, No murmure, No Rubs or Gallops   Abdomen:  Soft, non-tender, bowel sounds active all four quadrants, no guarding or peritoneal signs.  Muscular  skeletal:  Limited exam -Sever global generalized weaknesses - in bed, able to move all 4 extremities,   2+ pulses,  symmetric, No pitting edema  Skin:  Dry, warm to touch, negative for any Rashes,  Wounds: Please see nursing documentation          Data Reviewed: I have personally reviewed following labs  and imaging studies  CBC: Recent Labs  Lab 11/25/21 1043 11/26/21 0438 11/27/21 0445 11/28/21 0453  WBC 12.7* 12.8* 16.9* 22.7*  NEUTROABS 11.2*  --   --   --   HGB 7.6* 7.2* 7.5* 7.4*  HCT 24.7* 23.5* 24.9* 23.9*  MCV 75.8* 77.3* 77.8* 74.7*  PLT 643* 537* 645* 540*   Basic Metabolic Panel: Recent Labs  Lab 11/25/21 1043 11/26/21 0438 11/27/21 0445 11/28/21 0453  NA 133* 133* 133* 131*  K 5.0 5.1 4.6 4.6  CL 99 103 105 104  CO2 24 23 22  19*  GLUCOSE 178* 124* 152* 167*  BUN 33* 32* 31* 34*  CREATININE 1.41* 1.35* 1.17* 1.14*  CALCIUM 8.3* 7.8* 7.4* 7.8*  PHOS  --   --  3.5  --    GFR: Estimated Creatinine Clearance: 20.6 mL/min (A) (by C-G formula based on SCr of 1.14 mg/dL (H)). Liver Function Tests: Recent Labs  Lab 11/26/21 0438 11/27/21 0445  AST 53*  --   ALT 42  --   ALKPHOS 81  --   BILITOT 0.5  --   PROT 5.9*  --   ALBUMIN 2.4* 2.3*    Recent Results (from the past 240 hour(s))  SARS Coronavirus 2 by RT PCR (hospital order, performed in [facility] hospital lab) *cepheid single result test* Anterior Nasal Swab     Status: None   Collection Time: 11/25/21 10:45 AM   Specimen: Anterior Nasal Swab  Result Value Ref Range Status   SARS Coronavirus 2 by RT PCR NEGATIVE NEGATIVE Final    Comment: (NOTE) SARS-CoV-2 target nucleic acids are NOT DETECTED.  The SARS-CoV-2 RNA is generally detectable in upper and lower respiratory specimens during the acute phase of infection. The lowest concentration of SARS-CoV-2 viral copies this assay can detect is 250 copies / mL. A negative result does not preclude SARS-CoV-2 infection and should not be used as the sole basis for treatment or other patient management decisions.  A negative result may occur with improper  specimen collection / handling, submission of specimen other than nasopharyngeal swab, presence of viral mutation(s) within the areas targeted by this assay, and inadequate number of viral  copies (<250 copies / mL). A negative result must be combined with clinical observations, patient history, and epidemiological information.  Fact Sheet for Patients:   https://www.patel.info/  Fact Sheet for Healthcare Providers: https://hall.com/  This test is not yet approved or  cleared by the [place] FDA and has been authorized for detection and/or diagnosis of SARS-CoV-2 by FDA under an Emergency Use Authorization (EUA).  This EUA will remain in effect (meaning this test can be used) for the duration of the COVID-19 declaration under Section 564(b)(1) of the Act, 21 U.S.C. section 360bbb-3(b)(1), unless the authorization is terminated or revoked sooner.  Performed at [facility], [address], [place], [place] [address]   Blood culture (routine x 2)     Status: None (Preliminary result)   Collection Time: 11/25/21  1:13 PM   Specimen: Left Antecubital; Blood  Result Value Ref Range Status   Specimen Description LEFT ANTECUBITAL  Final   Special Requests   Final    BOTTLES DRAWN AEROBIC AND ANAEROBIC Blood Culture adequate volume   Culture   Final    NO GROWTH 2 DAYS Performed at [facility], [address] [place], [place] [address]    Report Status PENDING  Incomplete  Blood culture (routine x 2)     Status: None (Preliminary result)   Collection Time: 11/25/21  1:30 PM   Specimen: BLOOD RIGHT ARM  Result Value Ref Range Status   Specimen Description BLOOD RIGHT ARM  Final   Special Requests   Final    BOTTLES DRAWN AEROBIC AND ANAEROBIC Blood Culture adequate volume   Culture   Final    NO GROWTH 2 DAYS Performed at [facility], [address] [place], [place] [address]    Report Status PENDING  Incomplete  Urine Culture     Status: Abnormal   Collection Time: 11/26/21  2:19 PM   Specimen: Urine, Clean Catch  Result Value Ref Range Status   Specimen Description   Final    URINE, CLEAN CATCH Performed at [facility], [address], [place], [place] [address]    Special Requests   Final    NONE Performed at [facility], [address], [place], [place] [address]    Culture (A)  Final    <10,000 COLONIES/mL INSIGNIFICANT GROWTH Performed at [facility], [place] [address], [place], [place] [address]    Report Status 11/27/2021 FINAL  Final    Radiology Studies: DG CHEST PORT 1 VIEW  Result Date: 11/28/2021 CLINICAL DATA:  Shortness of breath EXAM: PORTABLE CHEST 1 VIEW COMPARISON:  AP chest 11/25/2021, chest two views 05/01/2021 FINDINGS: Cardiac silhouette is again mildly enlarged. Moderate to high-grade atherosclerotic calcifications within the thoracic aorta are similar to prior. Mildly decreased lung volumes. Small bilateral pleural effusions are unchanged. Mild bibasilar linear subsegmental atelectasis. Mild scattered lower lung patchy heterogeneous airspace opacities. No pneumothorax. Moderate multilevel degenerative disc changes of the thoracic spine. IMPRESSION: No significant change in small bilateral pleural effusions and bibasilar scattered linear and heterogeneous airspace opacities. This again may represent edema and/or infection. Electronically Signed   By: Yvonne Kendall M.D.   On: 11/28/2021 10:22    Scheduled Meds:  acidophilus  1 capsule Oral TID WC   aspirin EC  81 mg Oral Q breakfast   feeding supplement  237 mL Oral BID BM   heparin  5,000 Units Subcutaneous Q8H   hydrocortisone  25 mg Rectal BID   insulin aspart  0-5 Units Subcutaneous QHS   insulin aspart  0-6 Units Subcutaneous TID WC   magnesium citrate  1 Bottle Oral Once   metoprolol succinate  25 mg Oral Daily   pantoprazole  40 mg Oral Daily   polyethylene glycol  17 g Oral BID   sodium chloride flush  3 mL Intravenous Q12H   sodium chloride flush  3 mL Intravenous Q12H   sucralfate  1 g Oral TID WC   tamsulosin  0.4 mg Oral QPC supper   Continuous Infusions:  sodium chloride     dextrose 5 % and 0.45% NaCl      [START ON 11/30/2021] levofloxacin (LEVAQUIN) IV     promethazine (PHENERGAN) injection (IM or IVPB)       LOS: 3 days   Deatra James M.D on 11/28/2021 at 11:03 AM  Go to [email or web address] - for contact info  [facility] - Office  [phone]  If 7PM-7AM, please contact night-coverage [email or web address] 11/28/2021, 11:03 AM

## 2021-11-28 NOTE — Progress Notes (Signed)
OT Cancellation Note  Patient Details Name: Regina Good MRN: [id number] DOB: Feb 13, 1920   Cancelled Treatment:    Reason Eval/Treat Not Completed: Medical issues which prohibited therapy. Pt is reportedly going on hospice care. Pt will be removed form OT evaluation list. Please place new order if status changes.   Syan Cullimore OT, MOT   Larey Seat 11/28/2021, 2:03 PM

## 2021-11-28 NOTE — Progress Notes (Signed)
   11/28/21 0945  Assess: MEWS Score  Temp 97.6 F (36.4 C)  BP (!) 91/54  Pulse Rate (!) 120  Resp 16  Level of Consciousness Responds to Voice  SpO2 90 %  O2 Device Nasal Cannula  Patient Activity (if Appropriate) In bed  O2 Flow Rate (L/min) 2 L/min  Assess: MEWS Score  MEWS Temp 0  MEWS Systolic 1  MEWS Pulse 2  MEWS RR 0  MEWS LOC 1  MEWS Score 4  MEWS Score Color Red  Assess: if the MEWS score is Yellow or Red  Were vital signs taken at a resting state? Yes  Focused Assessment Change from prior assessment (see assessment flowsheet)  Does the patient meet 2 or more of the SIRS criteria? No  MEWS guidelines implemented *See Row Information* Yes  Treat  MEWS Interventions Escalated (See documentation below)  Pain Scale Faces  Pain Score 0  Take Vital Signs  Increase Vital Sign Frequency  Red: Q 1hr X 4 then Q 4hr X 4, if remains red, continue Q 4hrs  Escalate  MEWS: Escalate Red: discuss with charge nurse/RN and provider, consider discussing with RRT  Notify: Charge Nurse/RN  Name of Charge Nurse/RN Notified crystal reynolds, RN  Date Charge Nurse/RN Notified 11/28/21  Time Charge Nurse/RN Notified [id number]  Notify: Provider  Provider Name/Title dr [facility]  Date Provider Notified 11/28/21  Time Provider Notified [phone]  Method of Notification Page  Notification Reason Change in status  Assess: SIRS CRITERIA  SIRS Temperature  0  SIRS Pulse 1  SIRS Respirations  0  SIRS WBC 1  SIRS Score Sum  2

## 2021-11-28 NOTE — Progress Notes (Signed)
PT Cancellation Note  Patient Details Name: Regina Good MRN: [id number] DOB: [birth year]   Cancelled Treatment:    Reason Eval/Treat Not Completed: Other (comment)  Patient to be placed on residential hospice and discharged from physical therapy to care of nursing for out of bed as tolerated.  2:46 PM, 11/28/21 Lonell Grandchild, MPT Physical Therapist with [facility] [phone] office [phone] mobile phone

## 2021-11-29 DIAGNOSIS — A419 Sepsis, unspecified organism: Principal | ICD-10-CM

## 2021-11-29 DIAGNOSIS — J189 Pneumonia, unspecified organism: Secondary | ICD-10-CM

## 2021-11-29 LAB — BASIC METABOLIC PANEL
Anion gap: 7 (ref 5–15)
BUN: 36 mg/dL — ABNORMAL HIGH (ref 8–23)
CO2: 24 mmol/L (ref 22–32)
Calcium: 7.9 mg/dL — ABNORMAL LOW (ref 8.9–10.3)
Chloride: 102 mmol/L (ref 98–111)
Creatinine, Ser: 1.23 mg/dL — ABNORMAL HIGH (ref 0.44–1.00)
GFR, Estimated: 39 mL/min — ABNORMAL LOW (ref >60)
Glucose, Bld: 205 mg/dL — ABNORMAL HIGH (ref 70–99)
Potassium: 4.9 mmol/L (ref 3.5–5.1)
Sodium: 133 mmol/L — ABNORMAL LOW (ref 135–145)

## 2021-11-29 LAB — GLUCOSE, CAPILLARY
Glucose-Capillary: 219 mg/dL — ABNORMAL HIGH (ref 70–99)
Glucose-Capillary: 235 mg/dL — ABNORMAL HIGH (ref 70–99)

## 2021-11-29 LAB — CBC
HCT: 23.7 % — ABNORMAL LOW (ref 36.0–46.0)
Hemoglobin: 7.4 g/dL — ABNORMAL LOW (ref 12.0–15.0)
MCH: 23.6 pg — ABNORMAL LOW (ref 26.0–34.0)
MCHC: 31.2 g/dL (ref 30.0–36.0)
MCV: 75.5 fL — ABNORMAL LOW (ref 80.0–100.0)
Platelets: 643 10*3/uL — ABNORMAL HIGH (ref 150–400)
RBC: 3.14 MIL/uL — ABNORMAL LOW (ref 3.87–5.11)
RDW: 15.6 % — ABNORMAL HIGH (ref 11.5–15.5)
WBC: 22.7 10*3/uL — ABNORMAL HIGH (ref 4.0–10.5)
nRBC: 0.1 % (ref 0.0–0.2)

## 2021-11-29 MED ORDER — LEVOFLOXACIN 500 MG PO TABS: 500.0000 mg | ORAL_TABLET | ORAL | 0 refills | Status: AC

## 2021-11-29 MED ORDER — LEVOFLOXACIN 750 MG PO TABS: 750.0000 mg | ORAL_TABLET | ORAL | Status: DC

## 2021-11-29 MED ORDER — LEVOFLOXACIN 500 MG PO TABS: 500.0000 mg | ORAL_TABLET | ORAL | Status: DC

## 2021-11-29 NOTE — Progress Notes (Signed)
PROGRESS NOTE     Regina Good, is a 88 y.o. female, DOB - Nov 01, 1919, OXB:[id number]  Admit date - 11/25/2021   Admitting Physician Courage Denton Brick, MD  Outpatient Primary MD for the patient is Celene Squibb, MD  LOS - 4  Chief Complaint  Patient presents with   Emesis       Subjective: Regina Good -was seen and examined this morning, much more awake alert this morning in no acute distress No issues overnight  Patient had a vasovagal syncopal episode with ambulation yesterday no further episodes addressed   Brief Narrative:  Regina Good is a  88 y.o. female who is a hospice patient with pmhx relevant for CAD, T2DM, GERD, HTN, HLD , metastatic left renal cell carcinoma, CKD-3B, and chronic anemia admitted on 11/26/2021 with Sepsis  secondary to pneumonia concerns about possible UTI -Prior to admission patient was under hospice care at home    Assessment and Plan:   Sepsis secondary to Pneumonia--POA -Improved sepsis physiology with exception of leukocytosis  -Met sepsis criteria on admission -was on -IV Rocephin and  azithromycin >>> switch to Levaquin, cultures are negative to date C/n bronchodilators,  mucolytics  -Persistent leukocytosis>>> 22.7 today, procalcitonin 0.51, lactic acid 1.8 Chest x-ray:  no significant changes, small bilateral pleural effusion mild changes consistent with edema versus infection  Hemodynamically otherwise stable.  -Hypoxia has resolved -satting 99% on 2 L -c/n IV fluids as ordered   Possible UTI--- IV Rocephin, switch to p.o. Levaquin   Acute on chronic Anemia-- hemoglobin 7.2. >>7.5 >>> 7.4 Patient and her son confirmed refusal and denial of blood and blood product transfusion Hemoglobin- Down from a baseline usually between 9 and 10  Scant rectal bleeding when suppository was inserted on 11/26/21--??  Hemorrhoids -We will start Anusol HC -Offered blood transfusion patient and her son has refused   CKD stage - 3B  - -creatinine  is  close to patient's prior baseline -renally adjust medications, avoid nephrotoxic agents / dehydration  / hypotension   DM2- Use Novolog/Humalog Sliding scale insulin with Accu-Cheks/Fingersticks as ordered    HTN--continue metoprolol , stop Aldactone   Metastatic Left Renal cell Carcinoma--- supportive care, under hospice care prior to admission   Hyponatremia--sodium is 133 in the setting of vomiting and poor oral intake -Hydrate gently   Social/ethics- -- DNR/DNI, hospice patient, patient's son Regina Good is next of kin Discussed with Regina Good at bedside in regards to plan of care, disposition Patient clearly is not rehab verbal, with a few steps she syncopized this... Continues to deteriorate mentally and physically...Marland KitchenMarland Kitchen Prognosis remain poor, life expectancy less than 2 weeks.  Regina Good is agreed for hospice to be resumed TOC has been notified  Anticipating discharge to hospice in 1-2 days     Generalized weakness and deconditioning--- get PT eval when patient is more medically stable With very few steps patient syncopized this  ==================================================================================   Disposition/Need for in-Hospital Stay- patient unable to be discharged at this time due to  -Sepsis secondary to pneumonia and possible UTI requiring IV fluids and IV antibiotics   Status is: Inpatient   Remains inpatient appropriate because:    Dispo: The patient is from: Home              Anticipated d/c is to: Hospice               Anticipated d/c date is: 1  days  Patient currently is not medically stable to d/c.   Barriers: Not Clinically Stable yet  Code Status : -  Code Status: DNR   Family Communication:    NA (patient is alert, awake and coherent)   DVT Prophylaxis  :   - SCDs   Place and maintain sequential compression device Start: 11/28/21 1440 SCDs Start: 11/25/21 1441 Place TED hose Start: 11/25/21 1441   Lab Results  Component Value  Date   PLT 643 (H) 11/29/2021    Inpatient Medications  Scheduled Meds:  acidophilus  1 capsule Oral TID WC   aspirin EC  81 mg Oral Q breakfast   feeding supplement  237 mL Oral BID BM   hydrocortisone  25 mg Rectal BID   insulin aspart  0-5 Units Subcutaneous QHS   insulin aspart  0-6 Units Subcutaneous TID WC   magnesium citrate  1 Bottle Oral Once   metoprolol succinate  25 mg Oral Daily   pantoprazole  40 mg Oral Daily   polyethylene glycol  17 g Oral BID   sodium chloride flush  3 mL Intravenous Q12H   sodium chloride flush  3 mL Intravenous Q12H   sucralfate  1 g Oral TID WC   tamsulosin  0.4 mg Oral QPC supper   Continuous Infusions:  sodium chloride     dextrose 5 % and 0.45% NaCl 50 mL/hr at 11/28/21 1236   [START ON 11/30/2021] levofloxacin (LEVAQUIN) IV     promethazine (PHENERGAN) injection (IM or IVPB) 12.5 mg (11/28/21 1425)   PRN Meds:.sodium chloride, acetaminophen **OR** acetaminophen, ALPRAZolam, alum & mag hydroxide-simeth, bisacodyl, ondansetron **OR** ondansetron (ZOFRAN) IV, mouth rinse, promethazine (PHENERGAN) injection (IM or IVPB), sodium chloride flush, traZODone   Anti-infectives (From admission, onward)    Start     Dose/Rate Route Frequency Ordered Stop   11/30/21 0830  levofloxacin (LEVAQUIN) IVPB 750 mg        750 mg 100 mL/hr over 90 Minutes Intravenous Every 48 hours 11/28/21 1045     11/28/21 1130  levofloxacin (LEVAQUIN) IVPB 500 mg  Status:  Discontinued        500 mg 100 mL/hr over 60 Minutes Intravenous Every 24 hours 11/28/21 1044 11/28/21 1045   11/28/21 0815  levofloxacin (LEVAQUIN) tablet 750 mg  Status:  Discontinued        750 mg Oral Every 48 hours 11/28/21 0803 11/28/21 1045   11/26/21 1000  cefTRIAXone (ROCEPHIN) 1 g in sodium chloride 0.9 % 100 mL IVPB  Status:  Discontinued        1 g 200 mL/hr over 30 Minutes Intravenous Every 24 hours 11/25/21 1443 11/28/21 0803   11/25/21 1245  cefTRIAXone (ROCEPHIN) 1 g in sodium  chloride 0.9 % 100 mL IVPB        1 g 200 mL/hr over 30 Minutes Intravenous  Once 11/25/21 1241 11/25/21 1420   11/25/21 1245  azithromycin (ZITHROMAX) 500 mg in sodium chloride 0.9 % 250 mL IVPB  Status:  Discontinued        500 mg 250 mL/hr over 60 Minutes Intravenous Every 24 hours 11/25/21 1241 11/28/21 0803        Objective: Vitals:   11/28/21 0949 11/28/21 1234 11/28/21 2100 11/29/21 0500  BP: 125/67 122/74 122/60 118/62  Pulse: (!) 108 94 88 81  Resp: 14 18 18 16   Temp: 97.9 F (36.6 C) 98.3 F (36.8 C) 98.3 F (36.8 C) 98 F (36.7 C)  TempSrc: Axillary Oral  Oral  SpO2: 99% 100% 98% 99%  Weight:        Intake/Output Summary (Last 24 hours) at 11/29/2021 1052 Last data filed at 11/29/2021 0900 Gross per 24 hour  Intake 480 ml  Output --  Net 480 ml   Filed Weights   11/25/21 1048  Weight: 48.5 kg     Physical Exam:   General:  AAO x 3,  cooperative, no distress;   HEENT:  Normocephalic, PERRL, otherwise with in Normal limits   Neuro:  CNII-XII intact. , normal motor and sensation, reflexes intact   Lungs:   Clear to auscultation BL, Respirations unlabored,  No wheezes / crackles  Cardio:    S1/S2, RRR, No murmure, No Rubs or Gallops   Abdomen:  Soft, non-tender, bowel sounds active all four quadrants, no guarding or peritoneal signs.  Muscular  skeletal:  Limited exam -global generalized weaknesses - in bed, able to move all 4 extremities,   2+ pulses,  symmetric, No pitting edema  Skin:  Dry, warm to touch, negative for any Rashes,  Wounds: Please see nursing documentation      Data Reviewed: I have personally reviewed following labs and imaging studies  CBC: Recent Labs  Lab 11/25/21 1043 11/26/21 0438 11/27/21 0445 11/28/21 0453 11/29/21 0520  WBC 12.7* 12.8* 16.9* 22.7* 22.7*  NEUTROABS 11.2*  --   --   --   --   HGB 7.6* 7.2* 7.5* 7.4* 7.4*  HCT 24.7* 23.5* 24.9* 23.9* 23.7*  MCV 75.8* 77.3* 77.8* 74.7* 75.5*  PLT 643* 537* 645* 664*  161*   Basic Metabolic Panel: Recent Labs  Lab 11/25/21 1043 11/26/21 0438 11/27/21 0445 11/28/21 0453 11/29/21 0520  NA 133* 133* 133* 131* 133*  K 5.0 5.1 4.6 4.6 4.9  CL 99 103 105 104 102  CO2 24 23 22  19* 24  GLUCOSE 178* 124* 152* 167* 205*  BUN 33* 32* 31* 34* 36*  CREATININE 1.41* 1.35* 1.17* 1.14* 1.23*  CALCIUM 8.3* 7.8* 7.4* 7.8* 7.9*  PHOS  --   --  3.5  --   --    GFR: Estimated Creatinine Clearance: 19.1 mL/min (A) (by C-G formula based on SCr of 1.23 mg/dL (H)). Liver Function Tests: Recent Labs  Lab 11/26/21 0438 11/27/21 0445  AST 53*  --   ALT 42  --   ALKPHOS 81  --   BILITOT 0.5  --   PROT 5.9*  --   ALBUMIN 2.4* 2.3*    Recent Results (from the past 240 hour(s))  SARS Coronavirus 2 by RT PCR (hospital order, performed in [facility] hospital lab) *cepheid single result test* Anterior Nasal Swab     Status: None   Collection Time: 11/25/21 10:45 AM   Specimen: Anterior Nasal Swab  Result Value Ref Range Status   SARS Coronavirus 2 by RT PCR NEGATIVE NEGATIVE Final    Comment: (NOTE) SARS-CoV-2 target nucleic acids are NOT DETECTED.  The SARS-CoV-2 RNA is generally detectable in upper and lower respiratory specimens during the acute phase of infection. The lowest concentration of SARS-CoV-2 viral copies this assay can detect is 250 copies / mL. A negative result does not preclude SARS-CoV-2 infection and should not be used as the sole basis for treatment or other patient management decisions.  A negative result may occur with improper specimen collection / handling, submission of specimen other than nasopharyngeal swab, presence of viral mutation(s) within the areas targeted by this assay, and inadequate number of viral copies (<250  copies / mL). A negative result must be combined with clinical observations, patient history, and epidemiological information.  Fact Sheet for Patients:   https://www.patel.info/  Fact Sheet  for Healthcare Providers: https://hall.com/  This test is not yet approved or  cleared by the [place] FDA and has been authorized for detection and/or diagnosis of SARS-CoV-2 by FDA under an Emergency Use Authorization (EUA).  This EUA will remain in effect (meaning this test can be used) for the duration of the COVID-19 declaration under Section 564(b)(1) of the Act, 21 U.S.C. section 360bbb-3(b)(1), unless the authorization is terminated or revoked sooner.  Performed at [facility], [address] [place], [place] [address]   Blood culture (routine x 2)     Status: None (Preliminary result)   Collection Time: 11/25/21  1:13 PM   Specimen: Left Antecubital; Blood  Result Value Ref Range Status   Specimen Description LEFT ANTECUBITAL  Final   Special Requests   Final    BOTTLES DRAWN AEROBIC AND ANAEROBIC Blood Culture adequate volume   Culture   Final    NO GROWTH 2 DAYS Performed at [facility], [address] [place], [place] [address]    Report Status PENDING  Incomplete  Blood culture (routine x 2)     Status: None (Preliminary result)   Collection Time: 11/25/21  1:30 PM   Specimen: BLOOD RIGHT ARM  Result Value Ref Range Status   Specimen Description BLOOD RIGHT ARM  Final   Special Requests   Final    BOTTLES DRAWN AEROBIC AND ANAEROBIC Blood Culture adequate volume   Culture   Final    NO GROWTH 2 DAYS Performed at [facility], [address], [place], [place] [address]    Report Status PENDING  Incomplete  Urine Culture     Status: Abnormal   Collection Time: 11/26/21  2:19 PM   Specimen: Urine, Clean Catch  Result Value Ref Range Status   Specimen Description   Final    URINE, CLEAN CATCH Performed at [facility], [address], [place], [place] [address]    Special Requests   Final    NONE Performed at [facility], [address] [place], [place] [address]    Culture (A)  Final    <10,000 COLONIES/mL INSIGNIFICANT  GROWTH Performed at [facility], [place] [address] [place], [place] [address]    Report Status 11/27/2021 FINAL  Final    Radiology Studies: DG CHEST PORT 1 VIEW  Result Date: 11/28/2021 CLINICAL DATA:  Shortness of breath EXAM: PORTABLE CHEST 1 VIEW COMPARISON:  AP chest 11/25/2021, chest two views 05/01/2021 FINDINGS: Cardiac silhouette is again mildly enlarged. Moderate to high-grade atherosclerotic calcifications within the thoracic aorta are similar to prior. Mildly decreased lung volumes. Small bilateral pleural effusions are unchanged. Mild bibasilar linear subsegmental atelectasis. Mild scattered lower lung patchy heterogeneous airspace opacities. No pneumothorax. Moderate multilevel degenerative disc changes of the thoracic spine. IMPRESSION: No significant change in small bilateral pleural effusions and bibasilar scattered linear and heterogeneous airspace opacities. This again may represent edema and/or infection. Electronically Signed   By: Yvonne Kendall M.D.   On: 11/28/2021 10:22    Scheduled Meds:  acidophilus  1 capsule Oral TID WC   aspirin EC  81 mg Oral Q breakfast   feeding supplement  237 mL Oral BID BM   hydrocortisone  25 mg Rectal BID   insulin aspart  0-5 Units Subcutaneous QHS   insulin aspart  0-6 Units  Subcutaneous TID WC   magnesium citrate  1 Bottle Oral Once   metoprolol succinate  25 mg Oral Daily   pantoprazole  40 mg Oral Daily   polyethylene glycol  17 g Oral BID   sodium chloride flush  3 mL Intravenous Q12H   sodium chloride flush  3 mL Intravenous Q12H   sucralfate  1 g Oral TID WC   tamsulosin  0.4 mg Oral QPC supper   Continuous Infusions:  sodium chloride     dextrose 5 % and 0.45% NaCl 50 mL/hr at 11/28/21 1236   [START ON 11/30/2021] levofloxacin (LEVAQUIN) IV     promethazine (PHENERGAN) injection (IM or IVPB) 12.5 mg (11/28/21 1425)     LOS: 4 days   Deatra James M.D on 11/29/2021 at 10:52 AM  Go to [email or web address] - for contact  info  [facility] - Office  [phone]  If 7PM-7AM, please contact night-coverage [email or web address] 11/29/2021, 10:52 AM

## 2021-11-29 NOTE — TOC Transition Note (Signed)
Transition of Care [facility]) - CM/SW Discharge Note   Patient Details  Name: Regina Good MRN: [id number] Date of Birth: [birth year]  [facility] CM/SW Contact:  Iona Beard, [place] Phone Number: 11/29/2021, 1:01 PM   Clinical Narrative:    CSW updated by Anderson Malta with [facility] that pt has been accepted to [facility] in [place] for residential hospice services. RN to call report. CSW placed pt on EMS list. TOC signing off.   Final next level of care: [place] Barriers to Discharge: Barriers Resolved   Patient Goals and CMS Choice Patient states their goals for this hospitalization and ongoing recovery are:: residential hospice CMS Medicare.gov Compare Post Acute Care list provided to:: Patient Choice offered to / list presented to : Patient, Adult Children  Discharge Placement                       Discharge Plan and Services In-house Referral: Clinical Social Work                                   Social Determinants of Health (SDOH) Interventions     Readmission Risk Interventions    11/27/2021    2:10 PM  Readmission Risk Prevention Plan  Transportation Screening Complete  HRI or Home Care Consult Complete  Social Work Consult for [place] Planning/Counseling Complete  Medication Review Press photographer) Complete

## 2021-11-29 NOTE — Discharge Summary (Signed)
Physician Discharge Summary   Patient: Regina Good MRN: [id number] DOB: 23-Oct-1919  Admit date:     11/25/2021  Discharge date: 11/29/21  Discharge Physician: Deatra James   PCP: Celene Squibb, MD   Recommendations at discharge:   Follow-up with hospice  Discharge Diagnoses: Principal Problem:   Sepsis due to pneumonia [facility]) Active Problems:   Pneumonia   Hospice care patient   Type 2 diabetes mellitus with stage 3 chronic kidney disease ([place])   Essential hypertension   CAD (coronary artery disease), native coronary artery   CHRONIC KIDNEY DISEASE STAGE III (MODERATE)  Resolved Problems:   * No resolved hospital problems. *  Regina Good is a  88 y.o. female who is a hospice patient with pmhx relevant for CAD, T2DM, GERD, HTN, HLD , metastatic left renal cell carcinoma, CKD-3B, and chronic anemia admitted on 11/26/2021 with Sepsis  secondary to pneumonia concerns about possible UTI -Prior to admission patient was under hospice care at home     Patient was admitted, treated aggressively for sepsis as met sepsis criteria, source of sepsis was thought to be pneumonia.  Despite antibiotics she persistently had leukocytosis.  Significant debility, with PT and few steps she continued to have syncopal episodes.  Blood-tinged stool/urine contaminated blood noted, hemoglobin dropped to as low as 7.4, patient and her son refused any blood or blood product transfusion.  Blood pressure remained low, BP meds has been discontinued  Overall patient deteriorated from baseline, discussed extensively regarding CODE STATUS and plan of care with the patient's son who is the POA.  Patient is to return back under hospice care, at hospice home.   ---------------------------------------------------------------------------------------------------------------------------------------------     Sepsis secondary to Pneumonia--POA -Improved sepsis physiology with exception of leukocytosis   -Met  sepsis criteria on admission -was on -IV Rocephin and  azithromycin >>> switch to Levaquin, cultures are negative to date C/n bronchodilators,  mucolytics  -Persistent leukocytosis>>> 22.7 today, procalcitonin 0.51, lactic acid 1.8 Chest x-ray:  no significant changes, small bilateral pleural effusion mild changes consistent with edema versus infection   Hemodynamically otherwise stable.   -Hypoxia has resolved -satting 99% on 2 L    Possible UTI--- IV Rocephin, switch to p.o. Levaquin    Acute on chronic Anemia-- hemoglobin 7.2. >>7.5 >>> 7.4 Patient and her son confirmed refusal and denial of blood and blood product transfusion Hemoglobin- Down from a baseline usually between 9 and 10   Scant rectal bleeding when suppository was inserted on 11/26/21--possible hemorrhoids -We will start Anusol HC -Offered blood transfusion patient and her son has refused   CKD stage - 3B  - -creatinine  is close to patient's prior baseline -renally adjust medications, avoid nephrotoxic agents / dehydration  / hypotension   DM2- Use Novolog/Humalog Sliding scale insulin with Accu-Cheks/Fingersticks as ordered    HTN--blood pressure fluctuating, at times low, discontinued continue metoprolol , stop Aldactone   Metastatic Left Renal cell Carcinoma--- supportive care, under hospice care prior to admission Continue hospice care   Hyponatremia--sodium is 133 in the setting of vomiting and poor oral intake -Status post gentle IV fluid hydration   Social/ethics- -- DNR/DNI, hospice patient, patient's son [name]  Discussed with [name] at bedside in regards to plan of care, disposition Patient is clearly not rehab interval with a few steps she syncopized ... Continues to deteriorate mentally and physically...Marland KitchenMarland Kitchen Prognosis remain poor, life expectancy less than 2 weeks.  [name] is agreed for hospice to be resumed.  Anticipating discharge to hospice      Generalized weakness and deconditioning--- with  PT evaluation any exertion syncopized multiple times    Code Status : -  Code Status: DNR   Dispo: The patient is from: Home              Anticipated d/c is to: Hospice    Disposition: Hospice care Diet recommendation:  Discharge Diet Orders (From admission, onward)     Start     Ordered   11/29/21 0000  Diet - low sodium heart healthy        11/29/21 1232           Regular diet DISCHARGE MEDICATION: Allergies as of 11/29/2021       Reactions   Codeine Other (See Comments)   Sensation of being out of this world.   Sulfonamide Derivatives Other (See Comments)   Makes her feel like dreaming while awake.        Medication List     STOP taking these medications    amoxicillin-clavulanate 500-125 MG tablet Commonly known as: Augmentin   atorvastatin 20 MG tablet Commonly known as: Lipitor   furosemide 40 MG tablet Commonly known as: LASIX   magnesium citrate Soln   spironolactone 25 MG tablet Commonly known as: ALDACTONE       TAKE these medications    acetaminophen 325 MG tablet Commonly known as: TYLENOL Take 2 tablets (650 mg total) by mouth every 6 (six) hours as needed for mild pain (or Fever >/= 101).   ALPRAZolam 0.25 MG tablet Commonly known as: XANAX Take 1 tablet (0.25 mg total) by mouth at bedtime as needed for anxiety or sleep.   aspirin EC 81 MG tablet Take 1 tablet (81 mg total) by mouth daily with breakfast.   clopidogrel 75 MG tablet Commonly known as: PLAVIX Take 75 mg by mouth daily.   levofloxacin 500 MG tablet Commonly known as: LEVAQUIN Take 1 tablet (500 mg total) by mouth every other day for 5 doses. Start taking on: November 30, 2021   metFORMIN 500 MG tablet Commonly known as: GLUCOPHAGE Take 1 tablet (500 mg total) by mouth daily with breakfast.   metoprolol succinate 25 MG 24 hr tablet Commonly known as: TOPROL-XL Take 1 tablet (25 mg total) by mouth daily.   omeprazole 20 MG capsule Commonly known as:  PRILOSEC Take 1 capsule (20 mg total) by mouth daily.   polyethylene glycol 17 g packet Commonly known as: MIRALAX / GLYCOLAX Take 17 g by mouth daily.   sucralfate 1 g tablet Commonly known as: CARAFATE Take 1 g by mouth 3 (three) times daily.   tamsulosin 0.4 MG Caps capsule Commonly known as: FLOMAX Take 1 capsule (0.4 mg total) by mouth daily after supper.        Discharge Exam: Filed Weights   11/25/21 1048  Weight: 48.5 kg      Physical Exam:   General:  AAO x 3, chronically ill female, with cachexia  HEENT:  Normocephalic, PERRL, otherwise with in Normal limits   Neuro:  CNII-XII intact. , normal motor and sensation, reflexes intact   Lungs:   Clear to auscultation BL, Respirations unlabored,  No wheezes / crackles  Cardio:    S1/S2, RRR, No murmure, No Rubs or Gallops   Abdomen:  Soft, non-tender, bowel sounds active all four quadrants, no guarding or peritoneal signs.  Muscular  skeletal:  Limited exam -global generalized weaknesses - in bed, able to move  all 4 extremities,   2+ pulses,  symmetric, No pitting edema  Skin:  Dry, warm to touch, negative for any Rashes,  Wounds: Please see nursing documentation          Condition at discharge: stable  The results of significant diagnostics from this hospitalization (including imaging, microbiology, ancillary and laboratory) are listed below for reference.   Imaging Studies: DG CHEST PORT 1 VIEW  Result Date: 11/28/2021 CLINICAL DATA:  Shortness of breath EXAM: PORTABLE CHEST 1 VIEW COMPARISON:  AP chest 11/25/2021, chest two views 05/01/2021 FINDINGS: Cardiac silhouette is again mildly enlarged. Moderate to high-grade atherosclerotic calcifications within the thoracic aorta are similar to prior. Mildly decreased lung volumes. Small bilateral pleural effusions are unchanged. Mild bibasilar linear subsegmental atelectasis. Mild scattered lower lung patchy heterogeneous airspace opacities. No pneumothorax.  Moderate multilevel degenerative disc changes of the thoracic spine. IMPRESSION: No significant change in small bilateral pleural effusions and bibasilar scattered linear and heterogeneous airspace opacities. This again may represent edema and/or infection. Electronically Signed   By: Yvonne Kendall M.D.   On: 11/28/2021 10:22   DG Chest Portable 1 View  Result Date: 11/25/2021 CLINICAL DATA:  Weakness EXAM: PORTABLE CHEST 1 VIEW COMPARISON:  05/01/2021 FINDINGS: Cardiomegaly. Interstitial and scattered heterogeneous airspace opacities throughout the lungs with probable small layering pleural effusions. The visualized skeletal structures are unremarkable. IMPRESSION: Cardiomegaly with interstitial and scattered heterogeneous airspace opacities throughout the lungs and probable small layering pleural effusions, findings consistent with edema and or infection. Electronically Signed   By: Delanna Ahmadi M.D.   On: 11/25/2021 11:13   CT HEAD WO CONTRAST (5MM)  Result Date: 11/25/2021 CLINICAL DATA:  Head trauma, minor (Age >= 65y) EXAM: CT HEAD WITHOUT CONTRAST TECHNIQUE: Contiguous axial images were obtained from the base of the skull through the vertex without intravenous contrast. RADIATION DOSE REDUCTION: This exam was performed according to the departmental dose-optimization program which includes automated exposure control, adjustment of the mA and/or kV according to patient size and/or use of iterative reconstruction technique. COMPARISON:  CT head 09/24/2019. FINDINGS: Brain: No evidence of acute infarction, hemorrhage, hydrocephalus, extra-axial collection or mass lesion/mass effect. 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 Vascular: No hyperdense vessel. Calcific intracranial atherosclerosis. Skull: No acute fracture. Sinuses/Orbits: Clear sinuses.  No acute orbital findings. Other: No mastoid effusions.  IMPRESSION: No evidence of acute intracranial abnormality. Electronically Signed   By: Margaretha Sheffield M.D.   On: 11/25/2021 11:08    Microbiology: Results for orders placed or performed during the hospital encounter of 11/25/21  SARS Coronavirus 2 by RT PCR (hospital order, performed in [facility] hospital lab) *cepheid single result test* Anterior Nasal Swab     Status: None   Collection Time: 11/25/21 10:45 AM   Specimen: Anterior Nasal Swab  Result Value Ref Range Status   SARS Coronavirus 2 by RT PCR NEGATIVE NEGATIVE Final    Comment: (NOTE) SARS-CoV-2 target nucleic acids are NOT DETECTED.  The SARS-CoV-2 RNA is generally detectable in upper and lower respiratory specimens during the acute phase of infection. The lowest concentration of SARS-CoV-2 viral copies this assay can detect is 250 copies / mL. A negative result does not preclude SARS-CoV-2 infection and should not be used as the sole basis for treatment or other patient management decisions.  A negative result may occur with improper specimen collection / handling, submission of specimen other than nasopharyngeal swab, presence of viral  mutation(s) within the areas targeted by this assay, and inadequate number of viral copies (<250 copies / mL). A negative result must be combined with clinical observations, patient history, and epidemiological information.  Fact Sheet for Patients:   https://www.patel.info/  Fact Sheet for Healthcare Providers: https://hall.com/  This test is not yet approved or  cleared by the [place] FDA and has been authorized for detection and/or diagnosis of SARS-CoV-2 by FDA under an Emergency Use Authorization (EUA).  This EUA will remain in effect (meaning this test can be used) for the duration of the COVID-19 declaration under Section 564(b)(1) of the Act, 21 U.S.C. section 360bbb-3(b)(1), unless the authorization is terminated or revoked  sooner.  Performed at [facility], [address], [place], [place] [address]   Blood culture (routine x 2)     Status: None (Preliminary result)   Collection Time: 11/25/21  1:13 PM   Specimen: Left Antecubital; Blood  Result Value Ref Range Status   Specimen Description LEFT ANTECUBITAL  Final   Special Requests   Final    BOTTLES DRAWN AEROBIC AND ANAEROBIC Blood Culture adequate volume   Culture   Final    NO GROWTH 2 DAYS Performed at [facility], [address], [place], [place] [address]    Report Status PENDING  Incomplete  Blood culture (routine x 2)     Status: None (Preliminary result)   Collection Time: 11/25/21  1:30 PM   Specimen: BLOOD RIGHT ARM  Result Value Ref Range Status   Specimen Description BLOOD RIGHT ARM  Final   Special Requests   Final    BOTTLES DRAWN AEROBIC AND ANAEROBIC Blood Culture adequate volume   Culture   Final    NO GROWTH 2 DAYS Performed at [facility], [address] [place], [place] [address]    Report Status PENDING  Incomplete  Urine Culture     Status: Abnormal   Collection Time: 11/26/21  2:19 PM   Specimen: Urine, Clean Catch  Result Value Ref Range Status   Specimen Description   Final    URINE, CLEAN CATCH Performed at [facility], [address] [place], [place] [address]    Special Requests   Final    NONE Performed at [facility], [address], [place], [place] [address]    Culture (A)  Final    <10,000 COLONIES/mL INSIGNIFICANT GROWTH Performed at [facility], [place] [address], [place], [place] [address]    Report Status 11/27/2021 FINAL  Final    Labs: CBC: Recent Labs  Lab 11/25/21 1043 11/26/21 0438 11/27/21 0445 11/28/21 0453 11/29/21 0520  WBC 12.7* 12.8* 16.9* 22.7* 22.7*  NEUTROABS 11.2*  --   --   --   --   HGB 7.6* 7.2* 7.5* 7.4* 7.4*  HCT 24.7* 23.5* 24.9* 23.9* 23.7*  MCV 75.8* 77.3* 77.8* 74.7* 75.5*  PLT 643* 537* 645* 664* 962*   Basic Metabolic Panel: Recent Labs  Lab  11/25/21 1043 11/26/21 0438 11/27/21 0445 11/28/21 0453 11/29/21 0520  NA 133* 133* 133* 131* 133*  K 5.0 5.1 4.6 4.6 4.9  CL 99 103 105 104 102  CO2 24 23 22  19* 24  GLUCOSE 178* 124* 152* 167* 205*  BUN 33* 32* 31* 34* 36*  CREATININE 1.41* 1.35* 1.17* 1.14* 1.23*  CALCIUM 8.3* 7.8* 7.4* 7.8* 7.9*  PHOS  --   --  3.5  --   --    Liver Function Tests: Recent Labs  Lab  11/26/21 0438 11/27/21 0445  AST 53*  --   ALT 42  --   ALKPHOS 81  --   BILITOT 0.5  --   PROT 5.9*  --   ALBUMIN 2.4* 2.3*   CBG: Recent Labs  Lab 11/28/21 1232 11/28/21 1650 11/28/21 2201 11/29/21 0720 11/29/21 1101  GLUCAP 221* 210* 181* 219* 235*    Discharge time spent: greater than 30 minutes.  Signed: Deatra James, MD [facility] 11/29/2021

## 2021-11-29 NOTE — Progress Notes (Signed)
[facility] ([facility])  There is a bed to offer at [facility] today for Regina Good.  RN staff, please call report to [phone].  Hospital team updated via epic chat about d/c today to [facility].  [place] [id number]  Please update family, if not present in the room, when EMS arrives so they know when to expect her at [facility].  Thank you, Venia Carbon DNP, RN [facility] Liaison

## 2021-11-29 NOTE — Care Management Important Message (Signed)
Important Message  Patient Details  Name: Regina Good MRN: [id number] Date of Birth: [birth year]   Medicare Important Message Given:  Yes     [name] 11/29/2021, 11:46 AM

## 2021-11-30 LAB — CULTURE, BLOOD (ROUTINE X 2)
Culture: NO GROWTH
Culture: NO GROWTH
Special Requests: ADEQUATE
Special Requests: ADEQUATE

## 2021-12-15 DEATH — deceased

## 2022-10-28 IMAGING — DX DG CHEST 2V
2 series · 2 of 2 positions shown · non-contrast
Comparison: 09/24/2019

CLINICAL DATA: Chest pain

EXAM:
CHEST - 2 VIEW

[chest pa]
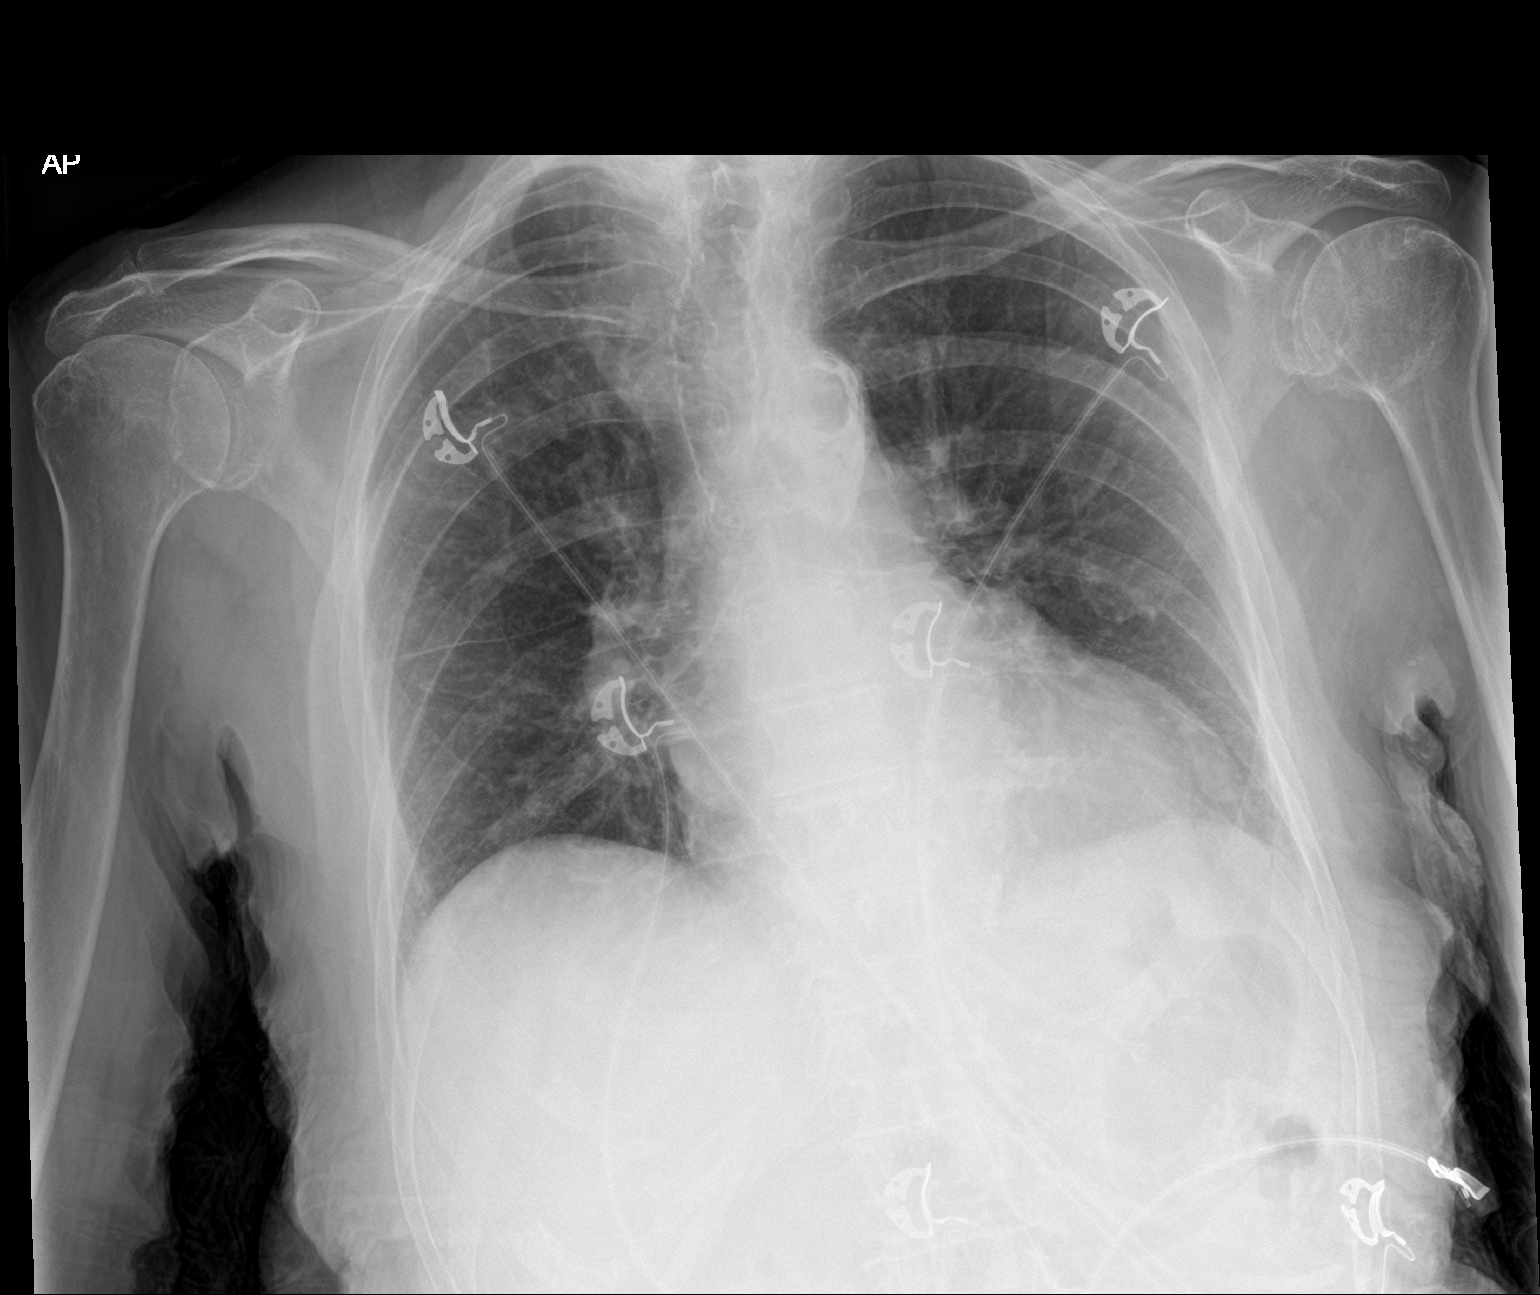

[chest lat]
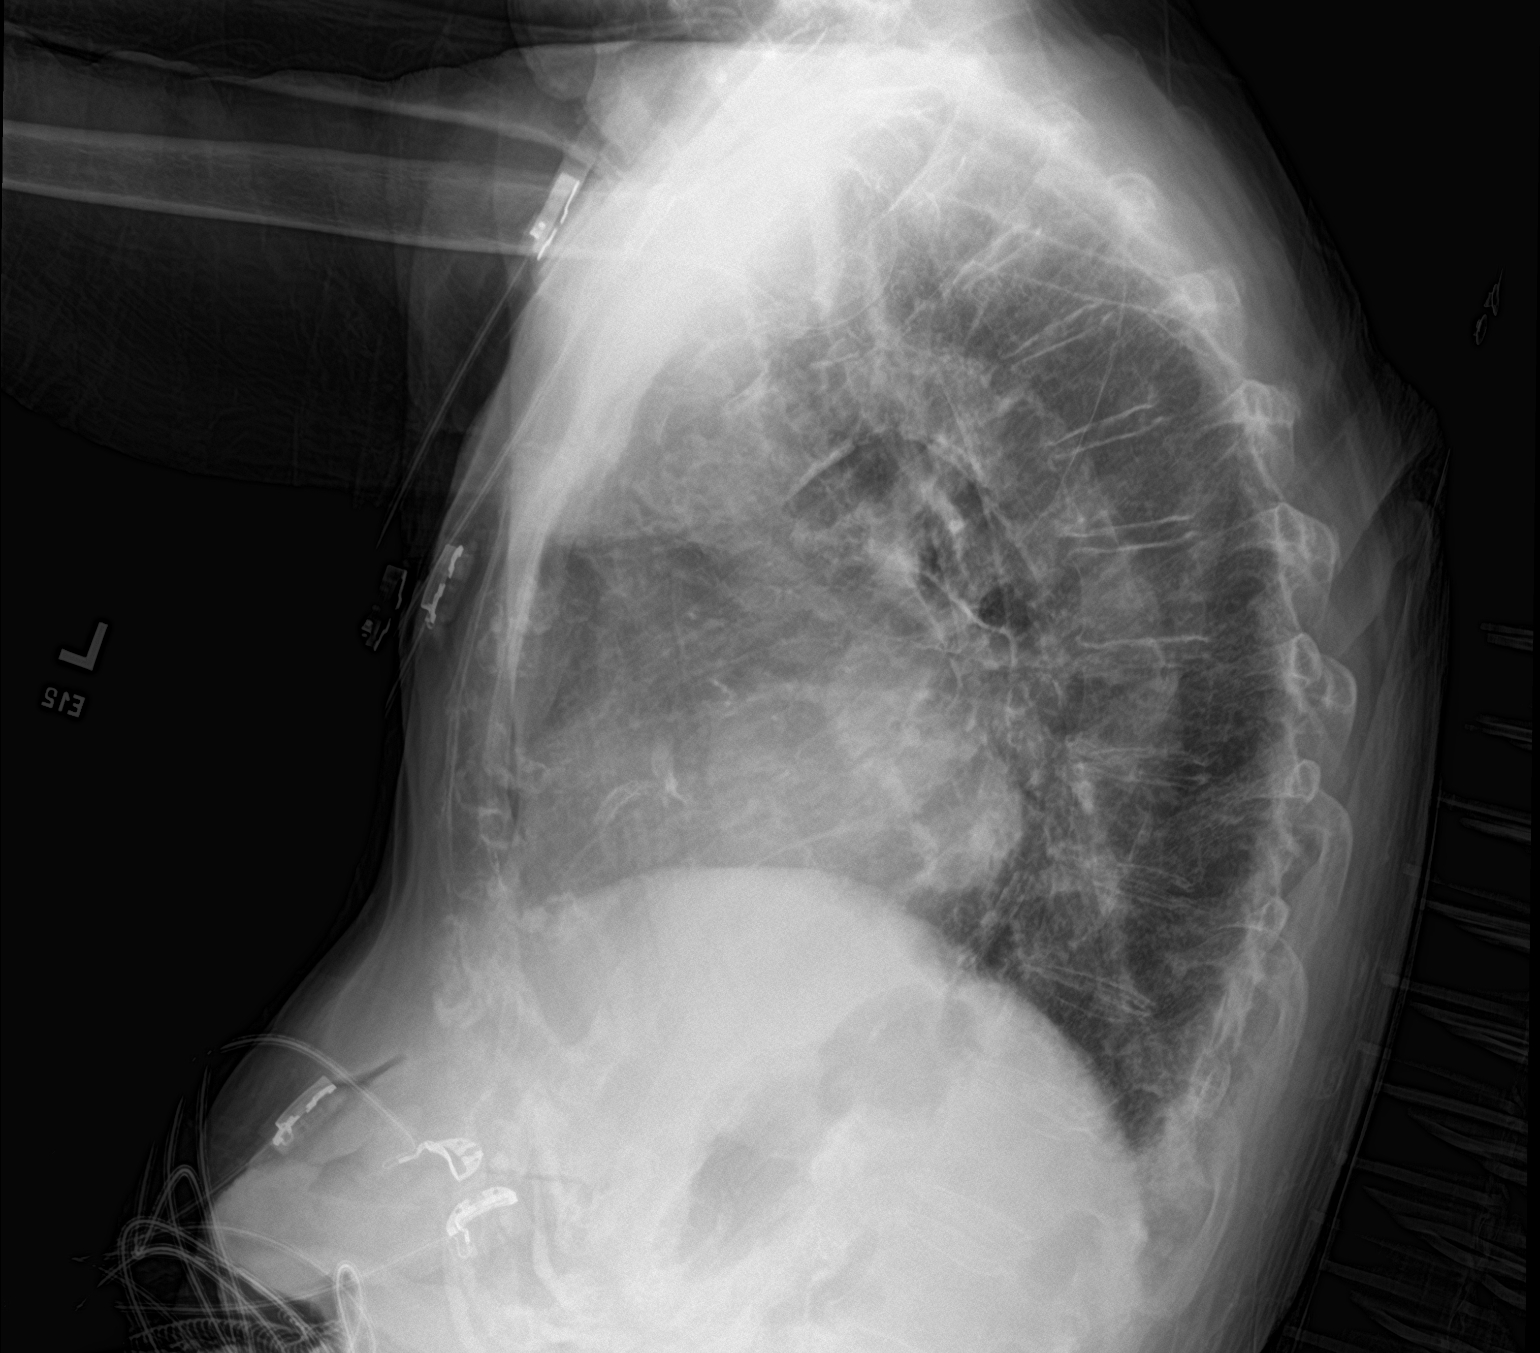

[2 of 2 positions shown; findings below may reference images not displayed]

FINDINGS: Mild cardiac enlargement without heart failure. Atherosclerotic
aortic arch. Lungs clear without infiltrate or effusion. No acute
skeletal abnormality.
IMPRESSION: No active cardiopulmonary disease.

## 2022-12-17 IMAGING — DX DG ABDOMEN 2V
2 series · 2 of 2 positions shown · non-contrast
Comparison: Abdominal radiographs 06/09/2016

CLINICAL DATA: Constipation.

EXAM:
ABDOMEN - 2 VIEW

[abdomen erect ap]
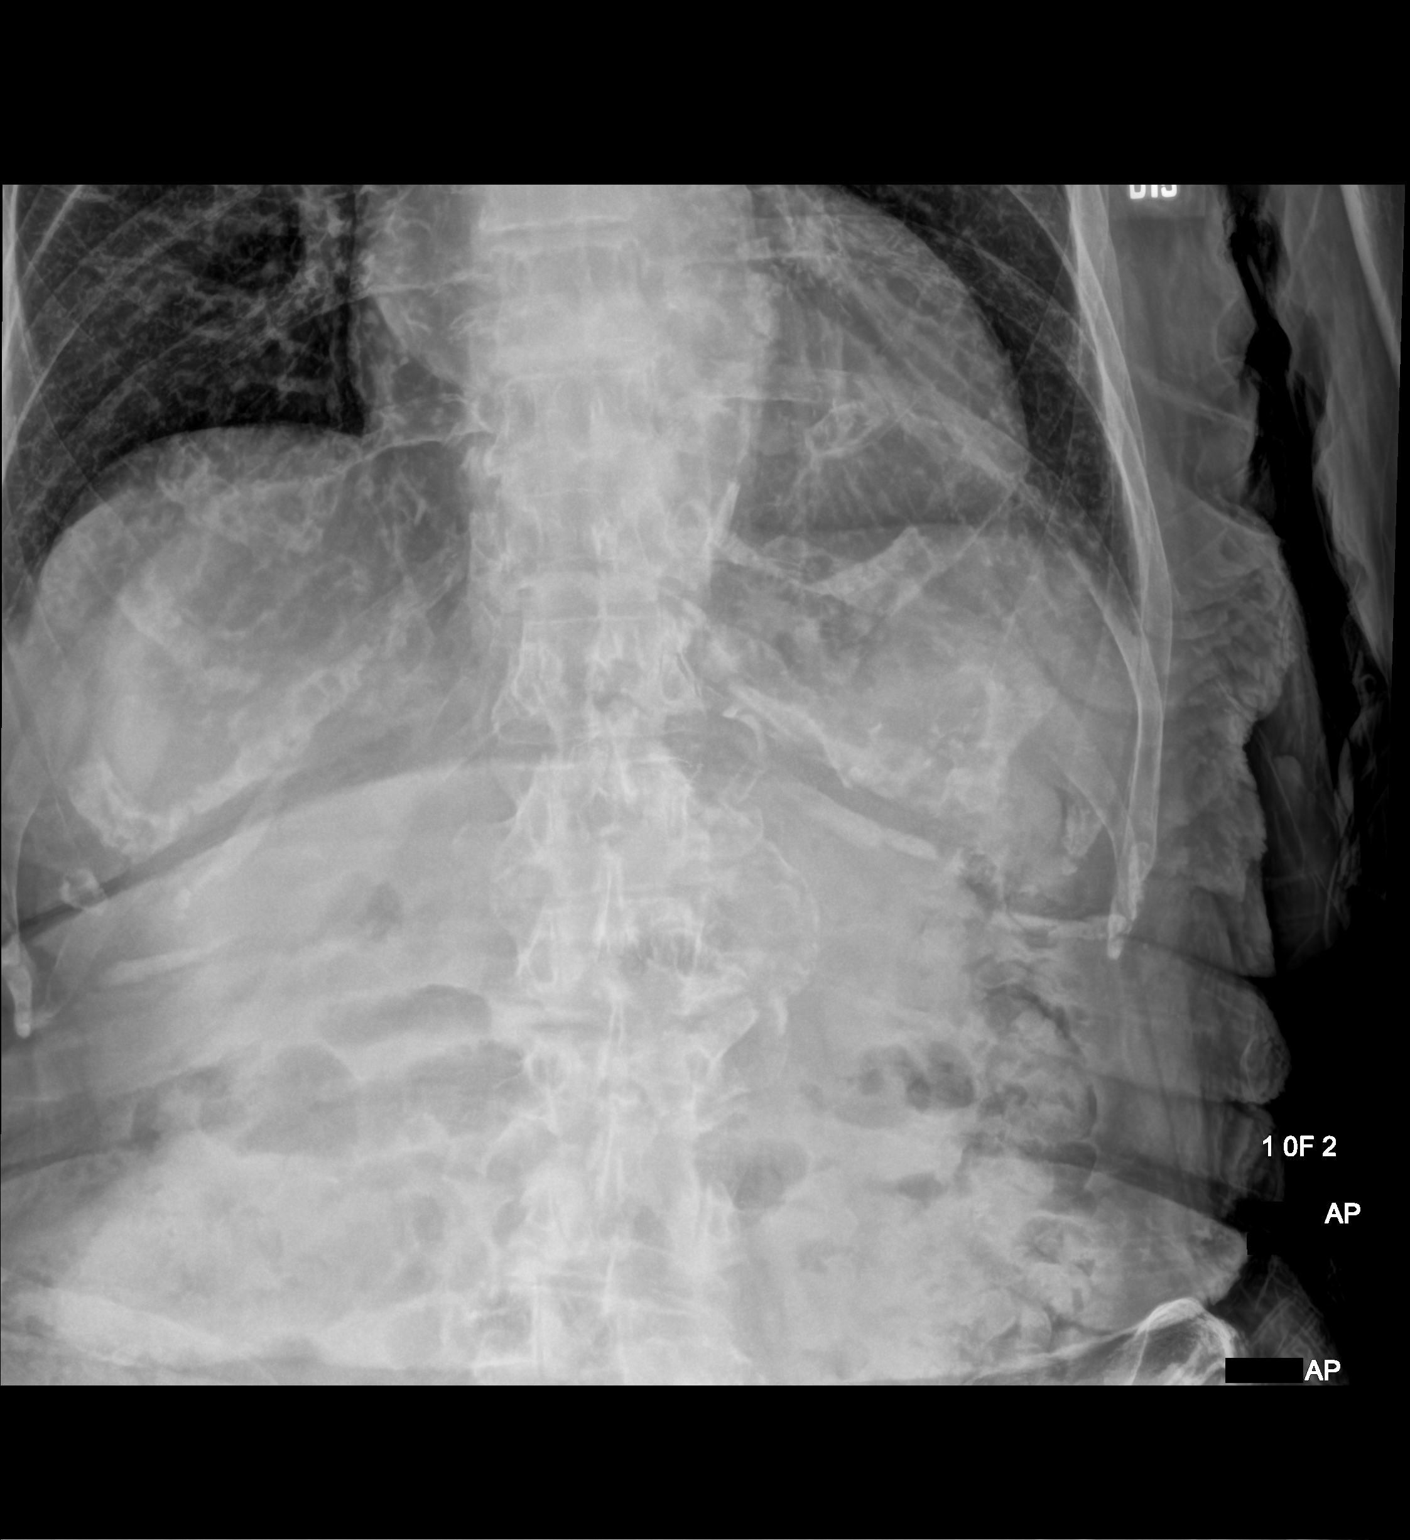

[abdomen supine]
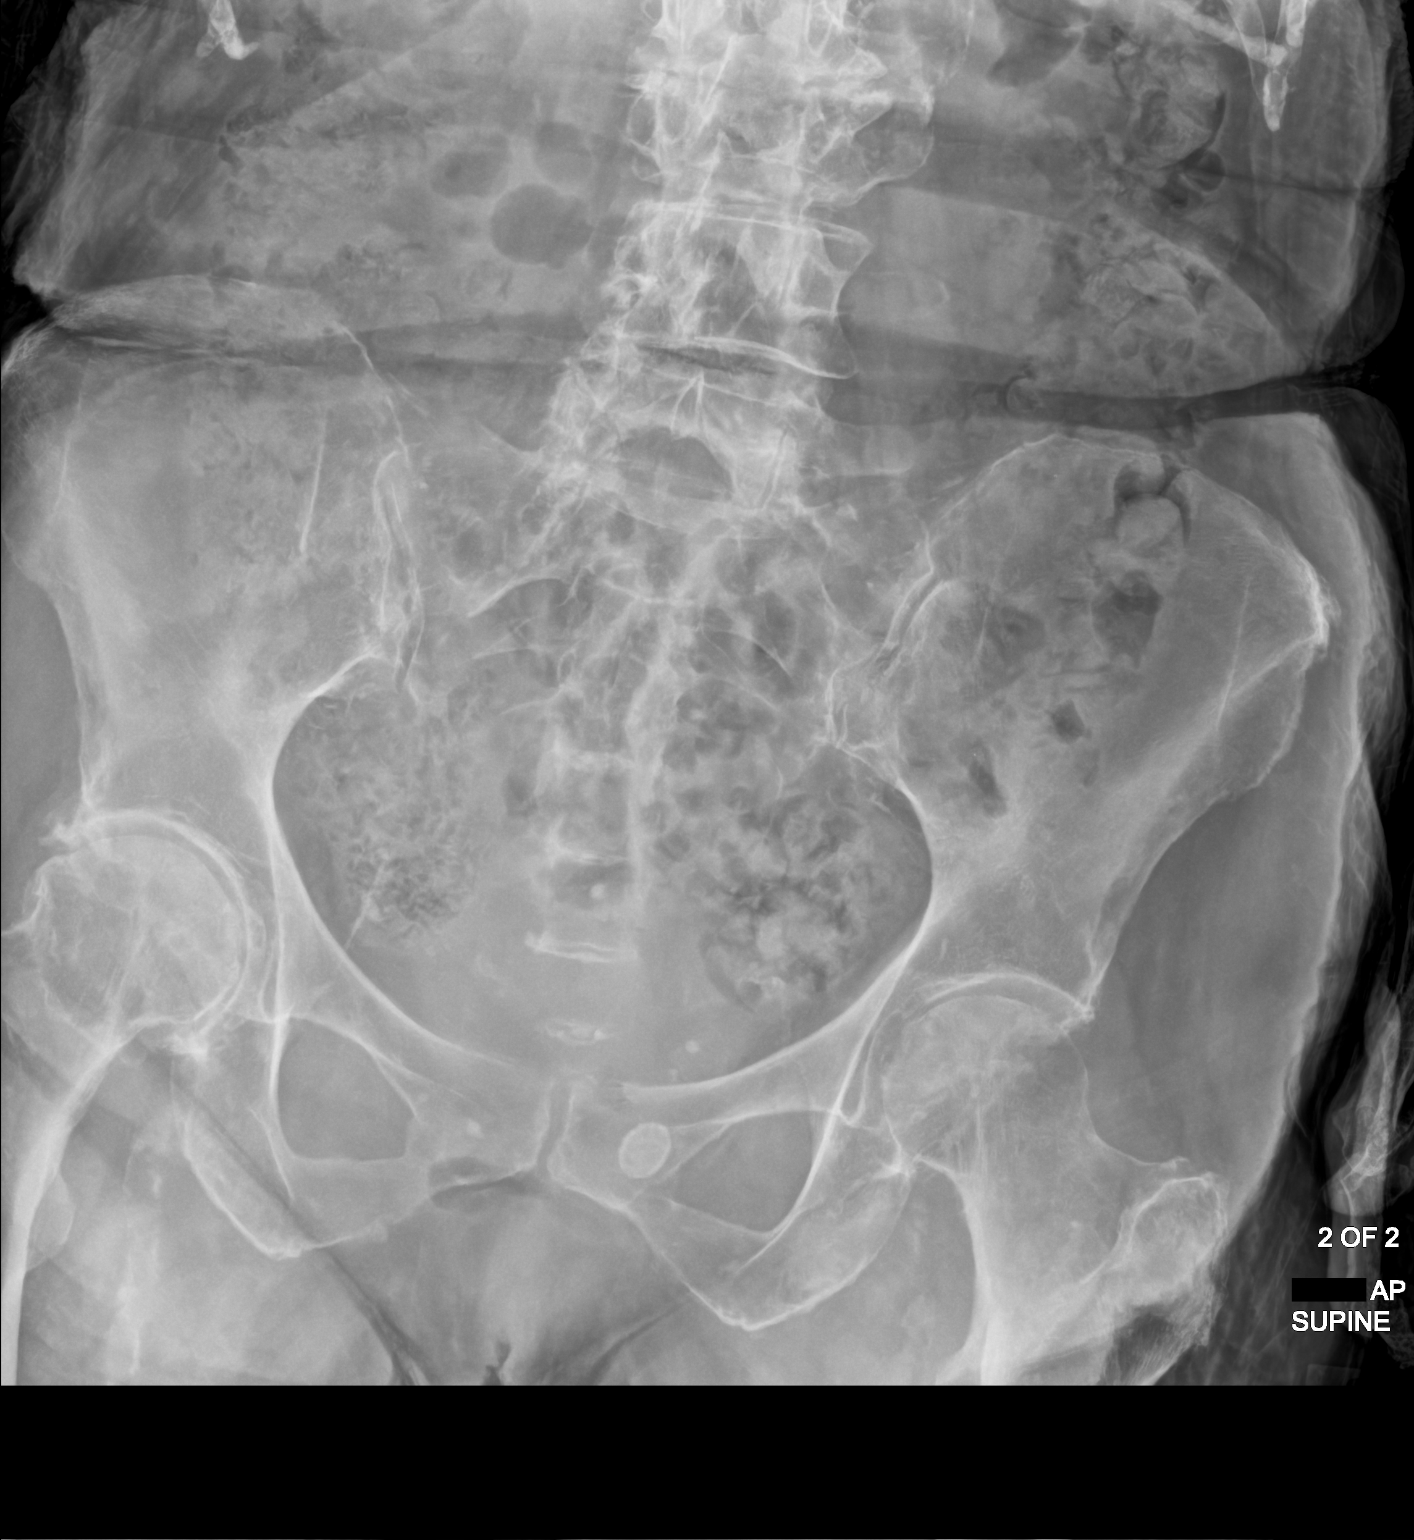

[2 of 2 positions shown; findings below may reference images not displayed]

FINDINGS: A moderate amount of stool is present in the colon. Scattered gas is
present in nondilated loops of small bowel without evidence of
obstruction. Atherosclerotic vascular calcification, pelvic
phleboliths, and degenerative changes in the lumbar spine and right
greater than left hips are noted. No consolidation is evident in the
lung bases.
IMPRESSION: Moderate colonic stool burden.  No evidence of bowel obstruction.
